# Patient Record
Sex: Male | Born: 1955 | Race: White | Hispanic: No | Marital: Single | Smoking: Never smoker
Health system: Southern US, Community
[De-identification: ages and names within clinical notes are randomized; demographics above are authoritative.]

## PROBLEM LIST (undated history)

## (undated) DIAGNOSIS — K429 Umbilical hernia without obstruction or gangrene: Secondary | ICD-10-CM

## (undated) DIAGNOSIS — R0989 Other specified symptoms and signs involving the circulatory and respiratory systems: Secondary | ICD-10-CM

## (undated) DIAGNOSIS — E7800 Pure hypercholesterolemia, unspecified: Secondary | ICD-10-CM

## (undated) DIAGNOSIS — F1010 Alcohol abuse, uncomplicated: Secondary | ICD-10-CM

## (undated) DIAGNOSIS — I10 Essential (primary) hypertension: Secondary | ICD-10-CM

## (undated) DIAGNOSIS — E669 Obesity, unspecified: Secondary | ICD-10-CM

## (undated) DIAGNOSIS — K4090 Unilateral inguinal hernia, without obstruction or gangrene, not specified as recurrent: Secondary | ICD-10-CM

## (undated) DIAGNOSIS — M1990 Unspecified osteoarthritis, unspecified site: Secondary | ICD-10-CM

## (undated) DIAGNOSIS — E119 Type 2 diabetes mellitus without complications: Secondary | ICD-10-CM

## (undated) HISTORY — DX: Type 2 diabetes mellitus without complications: E11.9

## (undated) HISTORY — DX: Pure hypercholesterolemia, unspecified: E78.00

## (undated) HISTORY — PX: TONSILLECTOMY: SUR1361

---

## 2011-06-26 ENCOUNTER — Ambulatory Visit: Payer: Self-pay | Admitting: Physician Assistant

## 2011-06-26 VITALS — BP 129/78 | HR 73 | Temp 97.7°F | Resp 18 | Ht 66.25 in | Wt 285.8 lb

## 2011-06-26 DIAGNOSIS — L299 Pruritus, unspecified: Secondary | ICD-10-CM

## 2011-06-26 DIAGNOSIS — L255 Unspecified contact dermatitis due to plants, except food: Secondary | ICD-10-CM

## 2011-06-26 MED ORDER — PREDNISONE 20 MG PO TABS: ORAL_TABLET | ORAL | Status: AC

## 2011-06-26 NOTE — Progress Notes (Signed)
Patient ID: Benjamin Rosales. MRN: [id number], DOB: [birth year], 56 y.o. Date of Encounter: 06/26/2011, 4:20 PM  Primary Physician: Tally Due, MD, MD  Chief Complaint: Pruritic rash  HPI: 56 y.o. year old male with presents with a 6 day history of mildly erythematous pruritic rash along bilateral forearms. Patient was doing yard work prior to the development of the rash. Unsure if poison ivy was in the vicinity. Has not yet washed all clothing or linens that have been exposed. Lesions now weeping clear fluid. Has tried Benadryl without much success. Patient is otherwise doing well without issues or complaints.  No past medical history on file.   Home Meds: Prior to Admission medications   Medication Sig Start Date End Date Taking? Authorizing Provider  Multiple Vitamin (MULTIVITAMIN) tablet Take 1 tablet by mouth daily.   Yes Historical Provider, MD  Omega-3 Fatty Acids (FISH OIL PO) Take 1 tablet by mouth daily.   Yes Historical Provider, MD         predniSONE (DELTASONE) 20 MG tablet 3 PO FOR 3 DAYS, 2 PO FOR 3 DAYS, 1 PO FOR 3 DAYS 06/26/11 07/06/11  Sondra Barges, PA-C    Allergies: No Known Allergies  History   Social History  . Marital Status: Single    Spouse Name: N/A    Number of Children: N/A  . Years of Education: N/A   Occupational History  . Not on file.   Social History Main Topics  . Smoking status: Never Smoker   . Smokeless tobacco: Not on file  . Alcohol Use: Not on file  . Drug Use: Not on file  . Sexually Active: Not on file   Other Topics Concern  . Not on file   Social History Narrative  . No narrative on file     Review of Systems: Constitutional: negative for chills, fever, night sweats, weight changes, or fatigue  HEENT: negative for vision changes, hearing loss, congestion, rhinorrhea, ST, epistaxis, or sinus pressure Cardiovascular: negative for chest pain or palpitations Respiratory: negative for hemoptysis, wheezing, shortness of  breath, or cough Abdominal: negative for abdominal pain, nausea, vomiting, diarrhea, or constipation Dermatological: see above Neurologic: negative for headache, dizziness, or syncope   Physical Exam: Blood pressure 129/78, pulse 73, temperature 97.7 F (36.5 C), temperature source Oral, resp. rate 18, height 5' 6.25" (1.683 m), weight 285 lb 12.8 oz (129.638 kg)., Body mass index is 45.78 kg/(m^2). General: Well developed, well nourished, in no acute distress. Head: Normocephalic, atraumatic, eyes without discharge, sclera non-icteric, nares are without discharge. Bilateral auditory canals clear, TM's are without perforation, pearly grey and translucent with reflective cone of light bilaterally. Oral cavity moist, posterior pharynx without exudate, erythema, peritonsillar abscess, or post nasal drip.  Neck: Supple. No thyromegaly. Full ROM. No lymphadenopathy. Lungs: Clear bilaterally to auscultation without wheezes, rales, or rhonchi. Breathing is unlabored. Heart: RRR with S1 S2. No murmurs, rubs, or gallops appreciated. Msk:  Strength and tone normal for age. Extremities/Skin: Warm and dry. Multiple vesicular weeping lesions along bilateral forearms consistent with poison ivy. No secondary infections present. No clubbing or cyanosis. No edema.  Neuro: Alert and oriented X 3. Moves all extremities spontaneously. Gait is normal. CNII-XII grossly in tact. Psych:  Responds to questions appropriately with a normal affect.   Labs: Results for orders placed in visit on 06/26/11  GLUCOSE, POCT (MANUAL RESULT ENTRY)      Component Value Range   POC Glucose 130  ASSESSMENT AND PLAN:  56 y.o. year old male with poison ivy and impaired glucose tolerance  1. Poison ivy -Prednisone 20 mg #18 3x3, 2x3, 1x3 no RF SED -Zyrtec -Zantac -Benadryl -Wash all contaminated clothes and linens -RTC 10 days if symptoms persist, sooner if they worsen  2. Impaired glucose tolerance -Close follow  up -Advised healthy diet -Needs CPE   Signed, Eula Listen, PA-C 06/26/2011 4:20 PM

## 2011-08-04 ENCOUNTER — Other Ambulatory Visit: Payer: Self-pay | Admitting: Physician Assistant

## 2019-04-01 DIAGNOSIS — I639 Cerebral infarction, unspecified: Secondary | ICD-10-CM

## 2019-04-01 HISTORY — DX: Cerebral infarction, unspecified: I63.9

## 2019-04-12 ENCOUNTER — Other Ambulatory Visit: Payer: Self-pay

## 2019-04-12 ENCOUNTER — Inpatient Hospital Stay (HOSPITAL_COMMUNITY)
Admission: EM | Admit: 2019-04-12 | Discharge: 2019-04-14 | DRG: 065 | Disposition: A | Discharge status: Home / Self Care | Payer: BC Managed Care – PPO | Attending: Internal Medicine | Admitting: Internal Medicine

## 2019-04-12 ENCOUNTER — Inpatient Hospital Stay (HOSPITAL_COMMUNITY): Payer: BC Managed Care – PPO

## 2019-04-12 ENCOUNTER — Emergency Department (HOSPITAL_COMMUNITY): Payer: BC Managed Care – PPO

## 2019-04-12 ENCOUNTER — Encounter (HOSPITAL_COMMUNITY): Payer: Self-pay | Admitting: Primary Care

## 2019-04-12 DIAGNOSIS — R29705 NIHSS score 5: Secondary | ICD-10-CM | POA: Diagnosis present

## 2019-04-12 DIAGNOSIS — I639 Cerebral infarction, unspecified: Secondary | ICD-10-CM

## 2019-04-12 DIAGNOSIS — I160 Hypertensive urgency: Secondary | ICD-10-CM | POA: Diagnosis present

## 2019-04-12 DIAGNOSIS — G8191 Hemiplegia, unspecified affecting right dominant side: Secondary | ICD-10-CM | POA: Diagnosis present

## 2019-04-12 DIAGNOSIS — F1010 Alcohol abuse, uncomplicated: Secondary | ICD-10-CM | POA: Diagnosis present

## 2019-04-12 DIAGNOSIS — E785 Hyperlipidemia, unspecified: Secondary | ICD-10-CM | POA: Diagnosis present

## 2019-04-12 DIAGNOSIS — I6381 Other cerebral infarction due to occlusion or stenosis of small artery: Principal | ICD-10-CM

## 2019-04-12 DIAGNOSIS — G4733 Obstructive sleep apnea (adult) (pediatric): Secondary | ICD-10-CM | POA: Diagnosis present

## 2019-04-12 DIAGNOSIS — R471 Dysarthria and anarthria: Secondary | ICD-10-CM | POA: Diagnosis present

## 2019-04-12 DIAGNOSIS — Z20822 Contact with and (suspected) exposure to covid-19: Secondary | ICD-10-CM | POA: Diagnosis present

## 2019-04-12 DIAGNOSIS — E119 Type 2 diabetes mellitus without complications: Secondary | ICD-10-CM | POA: Diagnosis present

## 2019-04-12 DIAGNOSIS — Z6841 Body Mass Index (BMI) 40.0 and over, adult: Secondary | ICD-10-CM

## 2019-04-12 DIAGNOSIS — I672 Cerebral atherosclerosis: Secondary | ICD-10-CM | POA: Diagnosis present

## 2019-04-12 DIAGNOSIS — I351 Nonrheumatic aortic (valve) insufficiency: Secondary | ICD-10-CM | POA: Diagnosis not present

## 2019-04-12 DIAGNOSIS — I10 Essential (primary) hypertension: Secondary | ICD-10-CM | POA: Diagnosis present

## 2019-04-12 HISTORY — DX: Essential (primary) hypertension: I10

## 2019-04-12 LAB — COMPREHENSIVE METABOLIC PANEL
ALT: 59 U/L — ABNORMAL HIGH (ref 0–44)
AST: 33 U/L (ref 15–41)
Albumin: 4 g/dL (ref 3.5–5.0)
Alkaline Phosphatase: 63 U/L (ref 38–126)
Anion gap: 6 (ref 5–15)
BUN: 14 mg/dL (ref 8–23)
CO2: 24 mmol/L (ref 22–32)
Calcium: 8.7 mg/dL — ABNORMAL LOW (ref 8.9–10.3)
Chloride: 104 mmol/L (ref 98–111)
Creatinine, Ser: 0.93 mg/dL (ref 0.61–1.24)
GFR calc Af Amer: >60 mL/min (ref >60)
GFR calc non Af Amer: >60 mL/min (ref >60)
Glucose, Bld: 213 mg/dL — ABNORMAL HIGH (ref 70–99)
Potassium: 4.1 mmol/L (ref 3.5–5.1)
Sodium: 134 mmol/L — ABNORMAL LOW (ref 135–145)
Total Bilirubin: 1 mg/dL (ref 0.3–1.2)
Total Protein: 6.5 g/dL (ref 6.5–8.1)

## 2019-04-12 LAB — PROTIME-INR
INR: 0.9 (ref 0.8–1.2)
Prothrombin Time: 12.5 seconds (ref 11.4–15.2)

## 2019-04-12 LAB — I-STAT CHEM 8, ED
BUN: 13 mg/dL (ref 8–23)
Calcium, Ion: 1.09 mmol/L — ABNORMAL LOW (ref 1.15–1.40)
Chloride: 101 mmol/L (ref 98–111)
Creatinine, Ser: 0.8 mg/dL (ref 0.61–1.24)
Glucose, Bld: 206 mg/dL — ABNORMAL HIGH (ref 70–99)
HCT: 44 % (ref 39.0–52.0)
Hemoglobin: 15 g/dL (ref 13.0–17.0)
Potassium: 4 mmol/L (ref 3.5–5.1)
Sodium: 136 mmol/L (ref 135–145)
TCO2: 25 mmol/L (ref 22–32)

## 2019-04-12 LAB — DIFFERENTIAL
Abs Immature Granulocytes: 0.09 10*3/uL — ABNORMAL HIGH (ref 0.00–0.07)
Basophils Absolute: 0 10*3/uL (ref 0.0–0.1)
Basophils Relative: 1 %
Eosinophils Absolute: 0.1 10*3/uL (ref 0.0–0.5)
Eosinophils Relative: 1 %
Immature Granulocytes: 2 %
Lymphocytes Relative: 20 %
Lymphs Abs: 1.2 10*3/uL (ref 0.7–4.0)
Monocytes Absolute: 0.6 10*3/uL (ref 0.1–1.0)
Monocytes Relative: 10 %
Neutro Abs: 4.1 10*3/uL (ref 1.7–7.7)
Neutrophils Relative %: 66 %

## 2019-04-12 LAB — APTT: aPTT: 24 seconds (ref 24–36)

## 2019-04-12 LAB — URINALYSIS, ROUTINE W REFLEX MICROSCOPIC
Bilirubin Urine: NEGATIVE
Glucose, UA: 50 mg/dL — AB
Hgb urine dipstick: NEGATIVE
Ketones, ur: NEGATIVE mg/dL
Leukocytes,Ua: NEGATIVE
Nitrite: NEGATIVE
Protein, ur: NEGATIVE mg/dL
Specific Gravity, Urine: 1.032 — ABNORMAL HIGH (ref 1.005–1.030)
pH: 7 (ref 5.0–8.0)

## 2019-04-12 LAB — RESPIRATORY PANEL BY RT PCR (FLU A&B, COVID)
Influenza A by PCR: NEGATIVE
Influenza B by PCR: NEGATIVE
SARS Coronavirus 2 by RT PCR: NEGATIVE

## 2019-04-12 LAB — CBC
HCT: 44.6 % (ref 39.0–52.0)
Hemoglobin: 15.3 g/dL (ref 13.0–17.0)
MCH: 31 pg (ref 26.0–34.0)
MCHC: 34.3 g/dL (ref 30.0–36.0)
MCV: 90.5 fL (ref 80.0–100.0)
Platelets: 180 10*3/uL (ref 150–400)
RBC: 4.93 MIL/uL (ref 4.22–5.81)
RDW: 12.4 % (ref 11.5–15.5)
WBC: 6.1 10*3/uL (ref 4.0–10.5)
nRBC: 0 % (ref 0.0–0.2)

## 2019-04-12 LAB — GLUCOSE, CAPILLARY: Glucose-Capillary: 145 mg/dL — ABNORMAL HIGH (ref 70–99)

## 2019-04-12 LAB — CBG MONITORING, ED: Glucose-Capillary: 204 mg/dL — ABNORMAL HIGH (ref 70–99)

## 2019-04-12 LAB — PHOSPHORUS: Phosphorus: 3.6 mg/dL (ref 2.5–4.6)

## 2019-04-12 LAB — MAGNESIUM: Magnesium: 1.8 mg/dL (ref 1.7–2.4)

## 2019-04-12 LAB — HEMOGLOBIN A1C
Hgb A1c MFr Bld: 6.9 % — ABNORMAL HIGH (ref 4.8–5.6)
Mean Plasma Glucose: 151.33 mg/dL

## 2019-04-12 MED ORDER — CLOPIDOGREL BISULFATE 75 MG PO TABS
75.0000 mg | ORAL_TABLET | Freq: Every day | ORAL | Status: DC
  Administered 2019-04-13 – 2019-04-14 (×2): 75 mg via ORAL
  Filled 2019-04-12 (×2): qty 1

## 2019-04-12 MED ORDER — ASPIRIN 325 MG PO TABS
325.0000 mg | ORAL_TABLET | ORAL | Status: AC
  Administered 2019-04-12: 325 mg via ORAL
  Filled 2019-04-12: qty 1

## 2019-04-12 MED ORDER — ENOXAPARIN SODIUM 40 MG/0.4ML ~~LOC~~ SOLN
40.0000 mg | SUBCUTANEOUS | Status: DC
  Administered 2019-04-12 – 2019-04-13 (×2): 40 mg via SUBCUTANEOUS
  Filled 2019-04-12 (×2): qty 0.4

## 2019-04-12 MED ORDER — STROKE: EARLY STAGES OF RECOVERY BOOK
Freq: Once | Status: AC
  Filled 2019-04-12: qty 1

## 2019-04-12 MED ORDER — VITAMIN B-12 100 MCG PO TABS
100.0000 ug | ORAL_TABLET | Freq: Every day | ORAL | Status: DC
  Administered 2019-04-12 – 2019-04-14 (×3): 100 ug via ORAL
  Filled 2019-04-12 (×4): qty 1

## 2019-04-12 MED ORDER — CLOPIDOGREL BISULFATE 300 MG PO TABS
300.0000 mg | ORAL_TABLET | ORAL | Status: AC
  Administered 2019-04-12: 300 mg via ORAL
  Filled 2019-04-12: qty 1

## 2019-04-12 MED ORDER — FOLIC ACID 1 MG PO TABS
1.0000 mg | ORAL_TABLET | Freq: Every day | ORAL | Status: DC
  Administered 2019-04-12 – 2019-04-14 (×3): 1 mg via ORAL
  Filled 2019-04-12 (×3): qty 1

## 2019-04-12 MED ORDER — ACETAMINOPHEN 160 MG/5ML PO SOLN: 650.0000 mg | ORAL | Status: DC | PRN

## 2019-04-12 MED ORDER — IOHEXOL 350 MG/ML SOLN
100.0000 mL | Freq: Once | INTRAVENOUS | Status: AC | PRN
  Administered 2019-04-12: 100 mL via INTRAVENOUS

## 2019-04-12 MED ORDER — ADULT MULTIVITAMIN W/MINERALS CH: 1.0000 | ORAL_TABLET | Freq: Every day | ORAL | Status: DC

## 2019-04-12 MED ORDER — HYDRALAZINE HCL 25 MG PO TABS
25.0000 mg | ORAL_TABLET | Freq: Four times a day (QID) | ORAL | Status: DC | PRN
  Administered 2019-04-13: 25 mg via ORAL
  Filled 2019-04-12: qty 1

## 2019-04-12 MED ORDER — THIAMINE HCL 100 MG/ML IJ SOLN
100.0000 mg | Freq: Every day | INTRAMUSCULAR | Status: DC
  Filled 2019-04-12: qty 2

## 2019-04-12 MED ORDER — ATORVASTATIN CALCIUM 40 MG PO TABS
40.0000 mg | ORAL_TABLET | Freq: Every day | ORAL | Status: DC
  Administered 2019-04-12 – 2019-04-13 (×2): 40 mg via ORAL
  Filled 2019-04-12 (×2): qty 1

## 2019-04-12 MED ORDER — SENNOSIDES-DOCUSATE SODIUM 8.6-50 MG PO TABS: 1.0000 | ORAL_TABLET | Freq: Every evening | ORAL | Status: DC | PRN

## 2019-04-12 MED ORDER — ACETAMINOPHEN 650 MG RE SUPP: 650.0000 mg | RECTAL | Status: DC | PRN

## 2019-04-12 MED ORDER — ASPIRIN 81 MG PO CHEW
81.0000 mg | CHEWABLE_TABLET | Freq: Every day | ORAL | Status: DC
  Administered 2019-04-13 – 2019-04-14 (×2): 81 mg via ORAL
  Filled 2019-04-12 (×2): qty 1

## 2019-04-12 MED ORDER — INSULIN ASPART 100 UNIT/ML ~~LOC~~ SOLN
0.0000 [IU] | Freq: Three times a day (TID) | SUBCUTANEOUS | Status: DC
  Administered 2019-04-12: 1 [IU] via SUBCUTANEOUS
  Administered 2019-04-13 (×2): 2 [IU] via SUBCUTANEOUS
  Administered 2019-04-13: 3 [IU] via SUBCUTANEOUS
  Administered 2019-04-14: 2 [IU] via SUBCUTANEOUS
  Administered 2019-04-14: 1 [IU] via SUBCUTANEOUS

## 2019-04-12 MED ORDER — THIAMINE HCL 100 MG PO TABS
100.0000 mg | ORAL_TABLET | Freq: Every day | ORAL | Status: DC
  Administered 2019-04-12 – 2019-04-14 (×3): 100 mg via ORAL
  Filled 2019-04-12 (×3): qty 1

## 2019-04-12 MED ORDER — LORAZEPAM 1 MG PO TABS: 1.0000 mg | ORAL_TABLET | ORAL | Status: DC | PRN

## 2019-04-12 MED ORDER — ADULT MULTIVITAMIN W/MINERALS CH
1.0000 | ORAL_TABLET | Freq: Every day | ORAL | Status: DC
  Administered 2019-04-12 – 2019-04-14 (×3): 1 via ORAL
  Filled 2019-04-12 (×3): qty 1

## 2019-04-12 MED ORDER — ACETAMINOPHEN 325 MG PO TABS: 650.0000 mg | ORAL_TABLET | ORAL | Status: DC | PRN

## 2019-04-12 MED ORDER — LORAZEPAM 2 MG/ML IJ SOLN: 1.0000 mg | INTRAMUSCULAR | Status: DC | PRN

## 2019-04-12 MED ORDER — SODIUM CHLORIDE 0.9% FLUSH
3.0000 mL | Freq: Once | INTRAVENOUS | Status: AC
Start: 2019-04-12 — End: 2019-04-12
  Administered 2019-04-12: 3 mL via INTRAVENOUS

## 2019-04-12 NOTE — ED Triage Notes (Signed)
Pt states R sided weakness since Tues.  Today he when he woke the was having difficulty speaking and he tried to spit when he was brushing teeth and noticed spit running down his face (around 1 pm).  Pt ao x 4.

## 2019-04-12 NOTE — Consult Note (Addendum)
NEURO HOSPITALIST  CONSULT   Requesting Physician: Dr.     Laurel Dimmer Complaint:  Slurred speech and right side weakness  History obtained from:  Patient     HPI:                                                                                                                                         Benjamin Rosales. is an 64 y.o. male  With no PMH who presented to [facility] ED as a code stroke for slurred speech and right side weakness.    Patient stated that he started feeling weakness on right side. This morning he woke up and wasn't feeling quite right. He noticed that he couldn't spit as forcefully while brushing his teeth. He called his girlfriend and she noticed that his speech was slurred. EMS was called. Denies any smoking, drug abuse, CP, SOB. Endorses drinking ETOH about 2 shots of 100 proof liquor per night. He states that he did stop drinking as of  Monday.   ED course:  CTH: no hemorrhage CTA: no LVO Creatinine: 0.80 BG: 206 BP 160/110  Date last known well: 04/09/19 Time last known well: unknown tPA Given: no outside of window Modified Rankin: Rankin Score=0 NIHSS:5    No past medical history on file.  History reviewed. No pertinent surgical history.  Family History  Problem Relation Age of Onset  . High blood pressure Father   . High blood pressure Brother       Social History:  reports that he has never smoked. He has never used smokeless tobacco. He reports current alcohol use of about 2.0 standard drinks of alcohol per week. He reports that he does not use drugs.  Allergies: No Known Allergies  Medications:                                                                                                                           Current Facility-Administered Medications  Medication Dose Route Frequency Provider Last Rate Last Admin  . [START ON 04/13/2019] aspirin chewable tablet 81 mg  81 mg Oral  Daily Arline Asp, NP      . aspirin tablet 325 mg  325 mg Oral NOW Arline Asp, NP      . clopidogrel (PLAVIX) tablet 300 mg  300 mg Oral NOW Arline Asp, NP      . Melene Muller ON 04/13/2019] clopidogrel (PLAVIX) tablet 75 mg  75 mg Oral Daily Valentina Lucks N, NP      . sodium chloride flush (NS) 0.9 % injection 3 mL  3 mL Intravenous Once Gwyneth Sprout, MD       Current Outpatient Medications  Medication Sig Dispense Refill  . Multiple Vitamin (MULTIVITAMIN) tablet Take 1 tablet by mouth daily.    . Omega-3 Fatty Acids (FISH OIL PO) Take 1 tablet by mouth daily.    Marland Kitchen UNABLE TO FIND Med Name: Vitamin b   1 tablet qd        ROS:                                                                                                                                       ROS was performed and is negative except as noted in HPI    General Examination:                                                                                                      There were no vitals taken for this visit.  Physical Exam  Constitutional: Appears well-developed and well-nourished.  Psych: Affect appropriate to situation Eyes: Normal external eye and conjunctiva. HENT: Normocephalic, no lesions, without obvious abnormality.   Musculoskeletal-no joint tenderness, deformity or swelling Cardiovascular: Normal rate and regular rhythm.  Respiratory: Effort normal, non-labored breathing saturations WNL GI: Soft.  No distension. There is no tenderness.  Skin: WDI  Neurological Examination Mental Status: Alert, oriented, thought content appropriate.  Speech fluent without evidence of aphasia.  Mild dysarthria. Able to follow  commands without difficulty. Cranial Nerves: II: Visual fields grossly normal,  III,IV, VI: ptosis not present, extra-ocular motions intact bilaterally, pupils equal, round, reactive to light and accommodation V,VII: facial droop, facial light touch sensation normal bilaterally VIII:  hearing normal bilaterally IX,X: uvula rises midline XI: bilateral shoulder shrug XII: midline tongue extension Motor: Right : Upper extremity   5/5  Left:     Upper extremity   5/5  Lower extremity   5/5  Lower extremity   4+/5 Tone and bulk:normal tone throughout; no atrophy noted Sensory:light touch intact throughout, bilaterally  Plantars: Right: downgoing   Left: downgoing Cerebellar: Ataxia on RUE Gait: deferred   Lab Results: Basic Metabolic Panel: Recent Labs  Lab 04/12/19 1447  NA 136  K 4.0  CL 101  GLUCOSE 206*  BUN 13  CREATININE 0.80    CBC: Recent Labs  Lab 04/12/19 1441 04/12/19 1447  WBC 6.1  --   NEUTROABS 4.1  --   HGB 15.3 15.0  HCT 44.6 44.0  MCV 90.5  --   PLT 180  --     CBG: Recent Labs  Lab 04/12/19 1440  GLUCAP 204*    Imaging: CT HEAD CODE STROKE WO CONTRAST  Result Date: 04/12/2019 CLINICAL DATA:  Code stroke. 64 year old male with right side weakness and slurred speech. EXAM: CT HEAD WITHOUT CONTRAST TECHNIQUE: Contiguous axial images were obtained from the base of the skull through the vertex without intravenous contrast. COMPARISON:  None. FINDINGS: Brain: No midline shift, mass effect, or evidence of intracranial mass lesion. No acute intracranial hemorrhage identified. No ventriculomegaly. No cortically based infarct changes identified in the left hemisphere. Mild for age scattered white matter hypodensity, somewhat more apparent on the right. Real versus artifactual oval hypodensity in the left pons on series 3, image 12. Vascular: Calcified atherosclerosis at the skull base. Dominant left vertebral artery. No convincing asymmetric vascular hyperdensity. Skull: Negative. Sinuses/Orbits: Maxillary sinus mucous retention cysts, other Visualized paranasal sinuses and mastoids are clear. Other: No acute orbit or scalp soft tissue finding. ASPECTS [facility] Stroke Program Early CT Score) Total score (0-10 with 10 being normal): 10  IMPRESSION: 1. Real versus artifactual lacunar infarct in the left pons. 2. No acute cortically based infarct or acute intracranial hemorrhage identified. ASPECTS 10. 3. These results were communicated to Dr. Laurence Slate at 2:55 pm on 04/12/2019 by text page via the [facility] messaging system. Electronically Signed   By: Odessa Fleming M.D.   On: 04/12/2019 14:56       Valentina Lucks, MSN, NP-C [facility] [phone]  04/12/2019, 2:49 PM   Attending physician note to follow with Assessment and plan .   Assessment: 64 y.o. male with no known PMH who presented to [facility] ED as a code stroke for c/o slurred speech and right side weakness. [facility] was negative for a hemorrhage. He was not a candidate for TPA d/t presenting outside of the window. He was not a candidate for IR d/t CTA negative for LVO. Patient was loaded with ASA and Plavix. Complete stroke work-up needed.  Stroke Risk Factors - none    Recommendations: -- BP goal : Permissive HTN upto 220/110 mmHg  plavix load 300 mg now and ASA 325 mg now ASA  81mg  and plavix daily x 3 weeks --MRI Brain  --Echocardiogram -- High intensity Statin if LDL > 70 -- HgbA1c, fasting lipid panel -- PT consult, OT consult, Speech consult --Telemetry monitoring --Frequent neuro checks --Stroke swallow screen    --please page stroke NP  Or  PA  Or MD from 8am -4 pm  as this patient from this time will be  followed by the stroke.   You can look them up on [email or web address]  Password [email or web address]   NEUROHOSPITALIST ADDENDUM Performed a face to face diagnostic evaluation.   I have reviewed the contents of history and physical exam as documented by PA/ARNP/Resident and agree with above documentation.  I have discussed and formulated the above plan as documented. Edits to the note have been made as needed.  64 year old male who does not  seek medical care, obese-likely has undiagnosed hypertension diabetes (presenting with elevated blood pressure and sugar 239)  presented to the emergency department with worsening slurred speech and right-sided weakness since this morning, symptoms howoever started on Tuesday.  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   Likely 2/2 small vessel disease. 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

## 2019-04-12 NOTE — ED Notes (Signed)
Updated brother Rosanne Ashing.

## 2019-04-12 NOTE — ED Notes (Signed)
Fayrene Fearing [phone]- would like pt updates.

## 2019-04-12 NOTE — ED Notes (Signed)
[phone] wife betsy cartson call for update

## 2019-04-12 NOTE — ED Notes (Signed)
Pt to xray and mri

## 2019-04-12 NOTE — H&P (Signed)
History and Physical    Benjamin Rosales. TTS:[id number] DOB: April 24, 1955 DOA: 04/12/2019  PCP: Benjamin Albee, MD   Patient coming from: Home  I have personally briefly reviewed patient's old medical records in [facility]  Chief Complaint: Right sided weakness  HPI: Benjamin Lara. is a 64 y.o. male with medical history significant of Morbid obesity, presented with new onset of right sided weakness and slurred speech. His symptoms started 3 days ago, initially was right arm and hand weakness, then later he started to feel right leg dragging and clumsy. He quite ignored the symptoms and did not seek medical attention. But last night he noticed that he had hard time to spit and this morning his girl friend found his speech was slurred and encouraged him come in. Denied any headache, no numbness on any limbs and no blurry vision, no chest pain or palpitations. ED Course: CT showed lacunar stroke and BMP showed elevated Glu. BP significantly elevated.  Review of Systems: As per HPI otherwise 10 point review of systems negative.     Past Medical History:  Diagnosis Date  . Hypertension     Past Surgical History:  Procedure Laterality Date  . TONSILLECTOMY       reports that he has never smoked. He has never used smokeless tobacco. He reports current alcohol use of about 28.0 standard drinks of alcohol per week. He reports that he does not use drugs.  No Known Allergies  Family History  Problem Relation Age of Onset  . High blood pressure Father   . High blood pressure Brother      Prior to Admission medications   Medication Sig Start Date End Date Taking? Authorizing Provider  Cyanocobalamin (VITAMIN B12 PO) Take 1 tablet by mouth daily.   Yes [provider]  Multiple Vitamin (MULTIVITAMIN) tablet Take 1 tablet by mouth daily.   Yes [provider]  naproxen sodium (ALEVE) 220 MG tablet Take 220 mg by mouth as needed (pain).   Yes [provider]    Physical Exam: Vitals:   04/12/19 1530 04/12/19 1545 04/12/19 1730 04/12/19 1808  BP: (!) 189/98 (!) 189/98 (!) 175/98 (!) 175/98  Pulse: 60 71 65 65  Resp: 16 20 20    SpO2: 95% 94% 96%   Weight:      Height:        Constitutional: NAD, calm, comfortable Vitals:   04/12/19 1530 04/12/19 1545 04/12/19 1730 04/12/19 1808  BP: (!) 189/98 (!) 189/98 (!) 175/98 (!) 175/98  Pulse: 60 71 65 65  Resp: 16 20 20    SpO2: 95% 94% 96%   Weight:      Height:       Eyes: PERRL, lids and conjunctivae normal ENMT: Mucous membranes are moist. Posterior pharynx clear of any exudate or lesions.Normal dentition.  Neck: normal, supple, no masses, no thyromegaly Respiratory: clear to auscultation bilaterally, no wheezing, no crackles. Normal respiratory effort. No accessory muscle use.  Cardiovascular: Regular rate and rhythm, no murmurs / rubs / gallops. No extremity edema. 2+ pedal pulses. No carotid bruits.  Abdomen: no tenderness, no masses palpated. No hepatosplenomegaly. Bowel sounds positive.  Musculoskeletal: no clubbing / cyanosis. No joint deformity upper and lower extremities. Good ROM, no contractures. Normal muscle tone.  Skin: no rashes, lesions, ulcers. No induration Neurologic: Tongue deviation to left, slight right facial droop, decreased muscle strength on right sided compared to the left, light touch sensation intact. Psychiatric: Normal judgment  and insight. Alert and oriented x 3. Normal mood.     Labs on Admission: I have personally reviewed following labs and imaging studies  CBC: Recent Labs  Lab 04/12/19 1441 04/12/19 1447  WBC 6.1  --   NEUTROABS 4.1  --   HGB 15.3 15.0  HCT 44.6 44.0  MCV 90.5  --   PLT 180  --    Basic Metabolic Panel: Recent Labs  Lab 04/12/19 1441 04/12/19 1447  NA 134* 136  K 4.1 4.0  CL 104 101  CO2 24  --   GLUCOSE 213* 206*  BUN 14 13  CREATININE 0.93 0.80  CALCIUM 8.7*  --    GFR: Estimated Creatinine  Clearance: 125.5 mL/min (by C-G formula based on SCr of 0.8 mg/dL). Liver Function Tests: Recent Labs  Lab 04/12/19 1441  AST 33  ALT 59*  ALKPHOS 63  BILITOT 1.0  PROT 6.5  ALBUMIN 4.0   No results for input(s): LIPASE, AMYLASE in the last 168 hours. No results for input(s): AMMONIA in the last 168 hours. Coagulation Profile: Recent Labs  Lab 04/12/19 1441  INR 0.9   Cardiac Enzymes: No results for input(s): CKTOTAL, CKMB, CKMBINDEX, TROPONINI in the last 168 hours. BNP (last 3 results) No results for input(s): PROBNP in the last 8760 hours. HbA1C: No results for input(s): HGBA1C in the last 72 hours. CBG: Recent Labs  Lab 04/12/19 1440  GLUCAP 204*   Lipid Profile: No results for input(s): CHOL, HDL, LDLCALC, TRIG, CHOLHDL, LDLDIRECT in the last 72 hours. Thyroid Function Tests: No results for input(s): TSH, T4TOTAL, FREET4, T3FREE, THYROIDAB in the last 72 hours. Anemia Panel: No results for input(s): VITAMINB12, FOLATE, FERRITIN, TIBC, IRON, RETICCTPCT in the last 72 hours. Urine analysis:    Component Value Date/Time   COLORURINE YELLOW 04/12/2019 1813   APPEARANCEUR CLEAR 04/12/2019 1813   LABSPEC 1.032 (H) 04/12/2019 1813   PHURINE 7.0 04/12/2019 1813   GLUCOSEU 50 (A) 04/12/2019 1813   HGBUR NEGATIVE 04/12/2019 1813   BILIRUBINUR NEGATIVE 04/12/2019 1813   KETONESUR NEGATIVE 04/12/2019 1813   PROTEINUR NEGATIVE 04/12/2019 1813   NITRITE NEGATIVE 04/12/2019 1813   LEUKOCYTESUR NEGATIVE 04/12/2019 1813    Radiological Exams on Admission: CT Code Stroke CTA Head W/WO contrast  Result Date: 04/12/2019 CLINICAL DATA:  Neuro deficit, acute, stroke suspected. EXAM: CT ANGIOGRAPHY HEAD AND NECK TECHNIQUE: Multidetector CT imaging of the head and neck was performed using the standard protocol during bolus administration of intravenous contrast. Multiplanar CT image reconstructions and MIPs were obtained to evaluate the vascular anatomy. Carotid stenosis  measurements (when applicable) are obtained utilizing NASCET criteria, using the distal internal carotid diameter as the denominator. CONTRAST:  OMNIPAQUE IOHEXOL 350 MG/ML SOLN COMPARISON:  Noncontrast head CT performed earlier the same day 04/12/2019 FINDINGS: CTA NECK FINDINGS Aortic arch: Common origin of the innominate and left common carotid arteries. Mild calcified plaque within the visualized aortic arch and proximal major branch vessels of the neck. No significant innominate or proximal subclavian artery stenosis. Right carotid system: The CCA and ICA are patent within the neck without measurable stenosis. Mild predominantly soft plaque within the proximal right ICA. Left carotid system: CCA and ICA patent within the neck without measurable stenosis. Mild mixed plaque within the distal CCA and carotid bifurcation. Vertebral arteries: The left vertebral artery is significantly dominant. The right vertebral artery is markedly diminutive on a developmental basis but appears patent throughout the neck. The left vertebral artery is patent  within the neck without measurable stenosis. Skeleton: No acute bony abnormality. Other neck: No neck mass identified or cervical lymphadenopathy. Upper chest: No consolidation within the imaged lung apices. Review of the MIP images confirms the above findings CTA HEAD FINDINGS Evaluation is limited due to contrast bolus timing. Anterior circulation: The intracranial internal carotid arteries are patent bilaterally with mild calcified plaque but no significant stenosis. The M1 middle cerebral arteries are patent without significant stenosis. Contrast bolus timing limits evaluation of the M2 and more distal MCA branch vessels bilaterally. No M2 proximal branch occlusion is identified. There are apparent multifocal stenoses within the M2 and more distal right MCA branch vessels. This includes sites of apparent moderate/severe stenosis within a proximal M2 right MCA branch  (series 12, image 17). No left M2 high-grade stenosis is identified. The A1 right anterior cerebral artery is developmentally absent. The A1 left anterior cerebral artery is patent without significant stenosis and supplies the A2 right ACA. Sites of up to moderate/severe stenosis within the proximal to mid A2 left anterior cerebral artery (series 12, image 23). Sites of mild/moderate stenosis within the A2 right anterior cerebral artery and more distal bilateral anterior cerebral arteries. No intracranial aneurysm is identified Posterior circulation: The non dominant right vertebral artery is diminutive but patent and appears to terminate as the right PICA. The intracranial left vertebral artery is patent. Calcified plaque within this vessel with out significant stenosis. The left vertebral artery supplies the basilar artery which is patent without significant stenosis. The posterior cerebral arteries are patent. Sites of mild stenosis within the P2 PCA is bilaterally. Sites of at least moderate stenosis within the distal posterior cerebral arteries bilaterally. Venous sinuses: As permitted by contrast timing, patent. Anatomic variants: Posterior communicating arteries are poorly delineated and may be hypoplastic or absent bilaterally. Review of the MIP images confirms the above findings IMPRESSION: CTA neck: 1. The bilateral common and internal carotid arteries are patent within the neck without significant stenosis. Mild atherosclerosis within the carotid systems as described. 2. The right vertebral artery is developmentally diminutive but appears patent throughout the neck. 3. Dominant left vertebral artery patent within the neck without stenosis. CTA head: 1. Evaluation limited by contrast bolus timing. 2. No intracranial large vessel occlusion. 3. Intracranial atherosclerotic disease with multifocal stenoses most notably as follows. 4. Apparent multifocal stenoses within proximal M2 and more distal right MCA  branch vessels. This includes sites of moderate/severe stenosis within a proximal right M2 branch. 5. Sites of up to moderate/severe stenosis within the proximal to mid A2 left anterior cerebral artery. 6. Sites of at least moderate stenosis within the distal posterior cerebral arteries bilaterally. Electronically Signed   By: Kellie Simmering DO   On: 04/12/2019 16:37   CT Code Stroke CTA Neck W/WO contrast  Result Date: 04/12/2019 CLINICAL DATA:  Neuro deficit, acute, stroke suspected. EXAM: CT ANGIOGRAPHY HEAD AND NECK TECHNIQUE: Multidetector CT imaging of the head and neck was performed using the standard protocol during bolus administration of intravenous contrast. Multiplanar CT image reconstructions and MIPs were obtained to evaluate the vascular anatomy. Carotid stenosis measurements (when applicable) are obtained utilizing NASCET criteria, using the distal internal carotid diameter as the denominator. CONTRAST:  159mL OMNIPAQUE IOHEXOL 350 MG/ML SOLN COMPARISON:  Noncontrast head CT performed earlier the same day 04/12/2019 FINDINGS: CTA NECK FINDINGS Aortic arch: Common origin of the innominate and left common carotid arteries. Mild calcified plaque within the visualized aortic arch and proximal major branch vessels of the  neck. No significant innominate or proximal subclavian artery stenosis. Right carotid system: The CCA and ICA are patent within the neck without measurable stenosis. Mild predominantly soft plaque within the proximal right ICA. Left carotid system: CCA and ICA patent within the neck without measurable stenosis. Mild mixed plaque within the distal CCA and carotid bifurcation. Vertebral arteries: The left vertebral artery is significantly dominant. The right vertebral artery is markedly diminutive on a developmental basis but appears patent throughout the neck. The left vertebral artery is patent within the neck without measurable stenosis. Skeleton: No acute bony abnormality. Other neck:  No neck mass identified or cervical lymphadenopathy. Upper chest: No consolidation within the imaged lung apices. Review of the MIP images confirms the above findings CTA HEAD FINDINGS Evaluation is limited due to contrast bolus timing. Anterior circulation: The intracranial internal carotid arteries are patent bilaterally with mild calcified plaque but no significant stenosis. The M1 middle cerebral arteries are patent without significant stenosis. Contrast bolus timing limits evaluation of the M2 and more distal MCA branch vessels bilaterally. No M2 proximal branch occlusion is identified. There are apparent multifocal stenoses within the M2 and more distal right MCA branch vessels. This includes sites of apparent moderate/severe stenosis within a proximal M2 right MCA branch (series 12, image 17). No left M2 high-grade stenosis is identified. The A1 right anterior cerebral artery is developmentally absent. The A1 left anterior cerebral artery is patent without significant stenosis and supplies the A2 right ACA. Sites of up to moderate/severe stenosis within the proximal to mid A2 left anterior cerebral artery (series 12, image 23). Sites of mild/moderate stenosis within the A2 right anterior cerebral artery and more distal bilateral anterior cerebral arteries. No intracranial aneurysm is identified Posterior circulation: The non dominant right vertebral artery is diminutive but patent and appears to terminate as the right PICA. The intracranial left vertebral artery is patent. Calcified plaque within this vessel with out significant stenosis. The left vertebral artery supplies the basilar artery which is patent without significant stenosis. The posterior cerebral arteries are patent. Sites of mild stenosis within the P2 PCA is bilaterally. Sites of at least moderate stenosis within the distal posterior cerebral arteries bilaterally. Venous sinuses: As permitted by contrast timing, patent. Anatomic variants:  Posterior communicating arteries are poorly delineated and may be hypoplastic or absent bilaterally. Review of the MIP images confirms the above findings IMPRESSION: CTA neck: 1. The bilateral common and internal carotid arteries are patent within the neck without significant stenosis. Mild atherosclerosis within the carotid systems as described. 2. The right vertebral artery is developmentally diminutive but appears patent throughout the neck. 3. Dominant left vertebral artery patent within the neck without stenosis. CTA head: 1. Evaluation limited by contrast bolus timing. 2. No intracranial large vessel occlusion. 3. Intracranial atherosclerotic disease with multifocal stenoses most notably as follows. 4. Apparent multifocal stenoses within proximal M2 and more distal right MCA branch vessels. This includes sites of moderate/severe stenosis within a proximal right M2 branch. 5. Sites of up to moderate/severe stenosis within the proximal to mid A2 left anterior cerebral artery. 6. Sites of at least moderate stenosis within the distal posterior cerebral arteries bilaterally. Electronically Signed   By: Jackey Loge DO   On: 04/12/2019 16:37   CT HEAD CODE STROKE WO CONTRAST  Result Date: 04/12/2019 CLINICAL DATA:  Code stroke. 64 year old male with right side weakness and slurred speech. EXAM: CT HEAD WITHOUT CONTRAST TECHNIQUE: Contiguous axial images were obtained from the base of the  skull through the vertex without intravenous contrast. COMPARISON:  None. FINDINGS: Brain: No midline shift, mass effect, or evidence of intracranial mass lesion. No acute intracranial hemorrhage identified. No ventriculomegaly. No cortically based infarct changes identified in the left hemisphere. Mild for age scattered white matter hypodensity, somewhat more apparent on the right. Real versus artifactual oval hypodensity in the left pons on series 3, image 12. Vascular: Calcified atherosclerosis at the skull base. Dominant  left vertebral artery. No convincing asymmetric vascular hyperdensity. Skull: Negative. Sinuses/Orbits: Maxillary sinus mucous retention cysts, other Visualized paranasal sinuses and mastoids are clear. Other: No acute orbit or scalp soft tissue finding. ASPECTS [facility] Stroke Program Early CT Score) Total score (0-10 with 10 being normal): 10 IMPRESSION: 1. Real versus artifactual lacunar infarct in the left pons. 2. No acute cortically based infarct or acute intracranial hemorrhage identified. ASPECTS 10. 3. These results were communicated to Dr. Laurence Slate at 2:55 pm on 04/12/2019 by text page via the [facility] messaging system. Electronically Signed   By: Odessa Fleming M.D.   On: 04/12/2019 14:56    EKG: Independently reviewed.   Assessment/Plan Active Problems:   Lacunar stroke (HCC)   Stroke (cerebrum) (HCC)  Lacunar stroke From uncontrolled HTN Permissive HTN for 48 hours (looks like his stroke symptoms still evolving) ASA and Statin. Echo Carotid Doppler MRI w/o contrast. CTA and CT reviewed.  New onset DM Check A1C Sliding scale for now Likely can go home with PO DM meds.  Uncontrolled HTN As above, PRN Hydralazine for now.  EtOH abuse No s/s of withdrawal as of now, CIWA protocol with PRN Benzo.  Morbid Obesity Outpt Bariatric evaluation  DVT prophylaxis: Lovenox Code Status: Full Code Family Communication: None at bedside Disposition Plan: PT evaluation, probably no need for rehab. Consults called: Neurology Admission status: Tele   Emeline General MD [facility] Pager [phone]    04/12/2019, 7:25 PM

## 2019-04-12 NOTE — Code Documentation (Signed)
64 yo male coming from home. Pt reports on Tuesday noting some right sided weakness in his arm and leg. This morning he woke up and felt like it was better, but not back to baseline. Around 1300, pt started brushing his teeth and noted that when he spit, it went down the front of his shirt and his speech sounded funny. Called EMS and EMS activated a Code Stroke. Stroke Team met patient upon arrival to the ED. Labs drawn and pt taken to CT. CT/CTA completed. Pt is not a candidate for tPA due to being outside the window. Pt brought back to the room and placed on the cardiac monitor. Aroor at the bedside assessing. q2 hour mNIHSS/VS and patient to be admitted for stroke work-up. Handoff given to [employer], Therapist, sports.

## 2019-04-12 NOTE — ED Provider Notes (Signed)
[facility]   CSN: [id number] Arrival date & time: 04/12/19  1439     History Chief Complaint  Patient presents with  . Code Stroke    Benjamin BendJohn W Copeman Jr. is a 64 y.o. male.  The history is provided by the patient and the EMS personnel.  Cerebrovascular Accident This is a new problem. The problem occurs constantly. The problem has not changed since onset.Associated symptoms include headaches (mild this AM, since resolved). Pertinent negatives include no chest pain, no abdominal pain and no shortness of breath. Nothing aggravates the symptoms. Nothing relieves the symptoms. He has tried nothing for the symptoms.  Here for slurred speech and right sided weakness.  Pt reports he had been feeling some weakness in his right hand for the past 3 days.  Noticed that he was having weakness in right leg this AM and was drooling when trying to brush his teeth.  Called his GF and was told his speech was slurred so EMS was called to bring in the pt.       Past Medical History:  Diagnosis Date  . Hypertension     Patient Active Problem List   Diagnosis Date Noted  . Lacunar stroke (HCC) 04/12/2019  . Acute ischemic stroke (HCC) 04/12/2019    Past Surgical History:  Procedure Laterality Date  . TONSILLECTOMY         Family History  Problem Relation Age of Onset  . High blood pressure Father   . High blood pressure Brother     Social History   Tobacco Use  . Smoking status: Never Smoker  . Smokeless tobacco: Never Used  Substance Use Topics  . Alcohol use: Yes    Alcohol/week: 28.0 standard drinks    Types: 28 Shots of liquor per week    Comment: 1 pint of liquor per day  . Drug use: Never    Home Medications Prior to Admission medications   Medication Sig Start Date End Date Taking? Authorizing Provider  Cyanocobalamin (VITAMIN B12 PO) Take 1 tablet by mouth daily.   Yes [provider]  Multiple Vitamin  (MULTIVITAMIN) tablet Take 1 tablet by mouth daily.   Yes [provider]  naproxen sodium (ALEVE) 220 MG tablet Take 220 mg by mouth as needed (pain).   Yes [provider]    Allergies    Patient has no known allergies.  Review of Systems   Review of Systems  Constitutional: Negative for fever.  Respiratory: Negative for shortness of breath.   Cardiovascular: Negative for chest pain.  Gastrointestinal: Negative for abdominal pain.  Neurological: Positive for headaches (mild this AM, since resolved).  All other systems reviewed and are negative.   Physical Exam Updated Vital Signs BP (!) 194/98 (BP Location: Left Arm)   Pulse 64   Temp 98.8 F (37.1 C) (Oral)   Resp 19   Ht 5\' 7"  (1.702 m)   Wt 129.6 kg   SpO2 97%   BMI 44.75 kg/m   Physical Exam Vitals and nursing note reviewed.  Constitutional:      Appearance: He is well-developed.  HENT:     Head: Normocephalic and atraumatic.  Eyes:     Conjunctiva/sclera: Conjunctivae normal.  Cardiovascular:     Rate and Rhythm: Normal rate and regular rhythm.     Heart sounds: No murmur.  Pulmonary:     Effort: Pulmonary effort is normal. No respiratory distress.     Breath sounds:  Normal breath sounds.  Abdominal:     Palpations: Abdomen is soft.     Tenderness: There is no abdominal tenderness.  Musculoskeletal:     Cervical back: Neck supple.  Skin:    General: Skin is warm and dry.  Neurological:     Mental Status: He is alert and oriented to person, place, and time.     GCS: GCS eye subscore is 4. GCS verbal subscore is 5. GCS motor subscore is 6.     Cranial Nerves: Dysarthria and facial asymmetry (right facial droop, mild) present.     Sensory: Sensation is intact.     Motor: No pronator drift.     Deep Tendon Reflexes: Reflexes are normal and symmetric.     ED Results / Procedures / Treatments   Labs (all labs ordered are listed, but only abnormal results are displayed) Labs Reviewed    DIFFERENTIAL - Abnormal; Notable for the following components:      Result Value   Abs Immature Granulocytes 0.09 (*)    All other components within normal limits  COMPREHENSIVE METABOLIC PANEL - Abnormal; Notable for the following components:   Sodium 134 (*)    Glucose, Bld 213 (*)    Calcium 8.7 (*)    ALT 59 (*)    All other components within normal limits  URINALYSIS, ROUTINE W REFLEX MICROSCOPIC - Abnormal; Notable for the following components:   Specific Gravity, Urine 1.032 (*)    Glucose, UA 50 (*)    All other components within normal limits  HEMOGLOBIN A1C - Abnormal; Notable for the following components:   Hgb A1c MFr Bld 6.9 (*)    All other components within normal limits  GLUCOSE, CAPILLARY - Abnormal; Notable for the following components:   Glucose-Capillary 145 (*)    All other components within normal limits  I-STAT CHEM 8, ED - Abnormal; Notable for the following components:   Glucose, Bld 206 (*)    Calcium, Ion 1.09 (*)    All other components within normal limits  CBG MONITORING, ED - Abnormal; Notable for the following components:   Glucose-Capillary 204 (*)    All other components within normal limits  RESPIRATORY PANEL BY RT PCR (FLU A&B, COVID)  PROTIME-INR  APTT  CBC  MAGNESIUM  PHOSPHORUS  BASIC METABOLIC PANEL  HEMOGLOBIN A1C  LIPID PANEL  HIV ANTIBODY (ROUTINE TESTING W REFLEX)    EKG EKG Interpretation  Date/Time:  Friday April 12 2019 15:12:01 EST Ventricular Rate:  74 PR Interval:    QRS Duration: 93 QT Interval:  394 QTC Calculation: 438 R Axis:   42 Text Interpretation: Sinus rhythm Minimal ST elevation, anterior leads No significant change since last tracing Confirmed by Gwyneth Sprout ([id number]) on 04/12/2019 3:47:12 PM   Radiology CT Code Stroke CTA Head W/WO contrast  Result Date: 04/12/2019 CLINICAL DATA:  Neuro deficit, acute, stroke suspected. EXAM: CT ANGIOGRAPHY HEAD AND NECK TECHNIQUE: Multidetector CT imaging of  the head and neck was performed using the standard protocol during bolus administration of intravenous contrast. Multiplanar CT image reconstructions and MIPs were obtained to evaluate the vascular anatomy. Carotid stenosis measurements (when applicable) are obtained utilizing NASCET criteria, using the distal internal carotid diameter as the denominator. CONTRAST:  OMNIPAQUE IOHEXOL 350 MG/ML SOLN COMPARISON:  Noncontrast head CT performed earlier the same day 04/12/2019 FINDINGS: CTA NECK FINDINGS Aortic arch: Common origin of the innominate and left common carotid arteries. Mild calcified plaque within the visualized aortic arch and  proximal major branch vessels of the neck. No significant innominate or proximal subclavian artery stenosis. Right carotid system: The CCA and ICA are patent within the neck without measurable stenosis. Mild predominantly soft plaque within the proximal right ICA. Left carotid system: CCA and ICA patent within the neck without measurable stenosis. Mild mixed plaque within the distal CCA and carotid bifurcation. Vertebral arteries: The left vertebral artery is significantly dominant. The right vertebral artery is markedly diminutive on a developmental basis but appears patent throughout the neck. The left vertebral artery is patent within the neck without measurable stenosis. Skeleton: No acute bony abnormality. Other neck: No neck mass identified or cervical lymphadenopathy. Upper chest: No consolidation within the imaged lung apices. Review of the MIP images confirms the above findings CTA HEAD FINDINGS Evaluation is limited due to contrast bolus timing. Anterior circulation: The intracranial internal carotid arteries are patent bilaterally with mild calcified plaque but no significant stenosis. The M1 middle cerebral arteries are patent without significant stenosis. Contrast bolus timing limits evaluation of the M2 and more distal MCA branch vessels bilaterally. No M2 proximal  branch occlusion is identified. There are apparent multifocal stenoses within the M2 and more distal right MCA branch vessels. This includes sites of apparent moderate/severe stenosis within a proximal M2 right MCA branch (series 12, image 17). No left M2 high-grade stenosis is identified. The A1 right anterior cerebral artery is developmentally absent. The A1 left anterior cerebral artery is patent without significant stenosis and supplies the A2 right ACA. Sites of up to moderate/severe stenosis within the proximal to mid A2 left anterior cerebral artery (series 12, image 23). Sites of mild/moderate stenosis within the A2 right anterior cerebral artery and more distal bilateral anterior cerebral arteries. No intracranial aneurysm is identified Posterior circulation: The non dominant right vertebral artery is diminutive but patent and appears to terminate as the right PICA. The intracranial left vertebral artery is patent. Calcified plaque within this vessel with out significant stenosis. The left vertebral artery supplies the basilar artery which is patent without significant stenosis. The posterior cerebral arteries are patent. Sites of mild stenosis within the P2 PCA is bilaterally. Sites of at least moderate stenosis within the distal posterior cerebral arteries bilaterally. Venous sinuses: As permitted by contrast timing, patent. Anatomic variants: Posterior communicating arteries are poorly delineated and may be hypoplastic or absent bilaterally. Review of the MIP images confirms the above findings IMPRESSION: CTA neck: 1. The bilateral common and internal carotid arteries are patent within the neck without significant stenosis. Mild atherosclerosis within the carotid systems as described. 2. The right vertebral artery is developmentally diminutive but appears patent throughout the neck. 3. Dominant left vertebral artery patent within the neck without stenosis. CTA head: 1. Evaluation limited by contrast  bolus timing. 2. No intracranial large vessel occlusion. 3. Intracranial atherosclerotic disease with multifocal stenoses most notably as follows. 4. Apparent multifocal stenoses within proximal M2 and more distal right MCA branch vessels. This includes sites of moderate/severe stenosis within a proximal right M2 branch. 5. Sites of up to moderate/severe stenosis within the proximal to mid A2 left anterior cerebral artery. 6. Sites of at least moderate stenosis within the distal posterior cerebral arteries bilaterally. Electronically Signed   By: Jackey Loge DO   On: 04/12/2019 16:37   DG Chest 2 View  Result Date: 04/12/2019 CLINICAL DATA:  Hypertension, neuro deficit, stroke suspected EXAM: CHEST - 2 VIEW COMPARISON:  None. FINDINGS: No consolidation, features of edema, pneumothorax, or effusion. Pulmonary  vascularity is normally distributed. The cardiomediastinal contours are unremarkable. No acute osseous or soft tissue abnormality. Degenerative changes are present in the imaged spine and shoulders. Telemetry leads overlie the chest. IMPRESSION: No acute cardiopulmonary findings. Electronically Signed   By: Kreg Shropshire M.D.   On: 04/12/2019 19:37   CT Code Stroke CTA Neck W/WO contrast  Result Date: 04/12/2019 CLINICAL DATA:  Neuro deficit, acute, stroke suspected. EXAM: CT ANGIOGRAPHY HEAD AND NECK TECHNIQUE: Multidetector CT imaging of the head and neck was performed using the standard protocol during bolus administration of intravenous contrast. Multiplanar CT image reconstructions and MIPs were obtained to evaluate the vascular anatomy. Carotid stenosis measurements (when applicable) are obtained utilizing NASCET criteria, using the distal internal carotid diameter as the denominator. CONTRAST:  OMNIPAQUE IOHEXOL 350 MG/ML SOLN COMPARISON:  Noncontrast head CT performed earlier the same day 04/12/2019 FINDINGS: CTA NECK FINDINGS Aortic arch: Common origin of the innominate and left common  carotid arteries. Mild calcified plaque within the visualized aortic arch and proximal major branch vessels of the neck. No significant innominate or proximal subclavian artery stenosis. Right carotid system: The CCA and ICA are patent within the neck without measurable stenosis. Mild predominantly soft plaque within the proximal right ICA. Left carotid system: CCA and ICA patent within the neck without measurable stenosis. Mild mixed plaque within the distal CCA and carotid bifurcation. Vertebral arteries: The left vertebral artery is significantly dominant. The right vertebral artery is markedly diminutive on a developmental basis but appears patent throughout the neck. The left vertebral artery is patent within the neck without measurable stenosis. Skeleton: No acute bony abnormality. Other neck: No neck mass identified or cervical lymphadenopathy. Upper chest: No consolidation within the imaged lung apices. Review of the MIP images confirms the above findings CTA HEAD FINDINGS Evaluation is limited due to contrast bolus timing. Anterior circulation: The intracranial internal carotid arteries are patent bilaterally with mild calcified plaque but no significant stenosis. The M1 middle cerebral arteries are patent without significant stenosis. Contrast bolus timing limits evaluation of the M2 and more distal MCA branch vessels bilaterally. No M2 proximal branch occlusion is identified. There are apparent multifocal stenoses within the M2 and more distal right MCA branch vessels. This includes sites of apparent moderate/severe stenosis within a proximal M2 right MCA branch (series 12, image 17). No left M2 high-grade stenosis is identified. The A1 right anterior cerebral artery is developmentally absent. The A1 left anterior cerebral artery is patent without significant stenosis and supplies the A2 right ACA. Sites of up to moderate/severe stenosis within the proximal to mid A2 left anterior cerebral artery (series  12, image 23). Sites of mild/moderate stenosis within the A2 right anterior cerebral artery and more distal bilateral anterior cerebral arteries. No intracranial aneurysm is identified Posterior circulation: The non dominant right vertebral artery is diminutive but patent and appears to terminate as the right PICA. The intracranial left vertebral artery is patent. Calcified plaque within this vessel with out significant stenosis. The left vertebral artery supplies the basilar artery which is patent without significant stenosis. The posterior cerebral arteries are patent. Sites of mild stenosis within the P2 PCA is bilaterally. Sites of at least moderate stenosis within the distal posterior cerebral arteries bilaterally. Venous sinuses: As permitted by contrast timing, patent. Anatomic variants: Posterior communicating arteries are poorly delineated and may be hypoplastic or absent bilaterally. Review of the MIP images confirms the above findings IMPRESSION: CTA neck: 1. The bilateral common and internal carotid arteries  are patent within the neck without significant stenosis. Mild atherosclerosis within the carotid systems as described. 2. The right vertebral artery is developmentally diminutive but appears patent throughout the neck. 3. Dominant left vertebral artery patent within the neck without stenosis. CTA head: 1. Evaluation limited by contrast bolus timing. 2. No intracranial large vessel occlusion. 3. Intracranial atherosclerotic disease with multifocal stenoses most notably as follows. 4. Apparent multifocal stenoses within proximal M2 and more distal right MCA branch vessels. This includes sites of moderate/severe stenosis within a proximal right M2 branch. 5. Sites of up to moderate/severe stenosis within the proximal to mid A2 left anterior cerebral artery. 6. Sites of at least moderate stenosis within the distal posterior cerebral arteries bilaterally. Electronically Signed   By: Kellie Simmering DO   On:  04/12/2019 16:37   MR BRAIN WO CONTRAST  Result Date: 04/12/2019 CLINICAL DATA:  Follow-up examination for acute stroke. EXAM: MRI HEAD WITHOUT CONTRAST TECHNIQUE: Multiplanar, multiecho pulse sequences of the brain and surrounding structures were obtained without intravenous contrast. COMPARISON:  Prior CT and CTA from earlier the same day. FINDINGS: Brain: Generalized age-related cerebral atrophy. Patchy T2/FLAIR hyperintensity within the periventricular deep white matter both cerebral hemispheres most consistent with chronic small vessel ischemic disease, moderate in nature. Area of restricted diffusion measuring 2.2 x 1.1 x 0.7 mm seen involving the left paramedian pons (series 5, image 71), consistent with acute ischemic infarct. No associated hemorrhage or mass effect. No other evidence for acute or subacute ischemia. Gray-white matter differentiation otherwise maintained. No encephalomalacia to suggest chronic cortical infarction elsewhere within the brain. No foci of susceptibility artifact to suggest acute or chronic intracranial hemorrhage. No mass lesion, midline shift or mass effect. No hydrocephalus. No extra-axial fluid collection. Pituitary gland suprasellar region normal. Midline structures intact. Vascular: Major intracranial vascular flow voids are well maintained. Hypoplastic right vertebral artery noted. Skull and upper cervical spine: Craniocervical junction within normal limits. Upper cervical spine normal. Bone marrow signal intensity within normal limits. No scalp soft tissue abnormality. Sinuses/Orbits: Globes and orbital soft tissues demonstrate no acute finding. Small retention cyst noted within the maxillary sinuses bilaterally. Paranasal sinuses are otherwise clear. No mastoid effusion. Inner ear structures grossly normal. Other: None. IMPRESSION: 1. 2.2 x 1.1 x 0.7 mm acute ischemic nonhemorrhagic left paramedian pontine infarct. 2. Underlying mild cerebral atrophy with moderate  chronic small vessel ischemic disease. Electronically Signed   By: Jeannine Boga M.D.   On: 04/12/2019 20:38   CT HEAD CODE STROKE WO CONTRAST  Result Date: 04/12/2019 CLINICAL DATA:  Code stroke. 64 year old male with right side weakness and slurred speech. EXAM: CT HEAD WITHOUT CONTRAST TECHNIQUE: Contiguous axial images were obtained from the base of the skull through the vertex without intravenous contrast. COMPARISON:  None. FINDINGS: Brain: No midline shift, mass effect, or evidence of intracranial mass lesion. No acute intracranial hemorrhage identified. No ventriculomegaly. No cortically based infarct changes identified in the left hemisphere. Mild for age scattered white matter hypodensity, somewhat more apparent on the right. Real versus artifactual oval hypodensity in the left pons on series 3, image 12. Vascular: Calcified atherosclerosis at the skull base. Dominant left vertebral artery. No convincing asymmetric vascular hyperdensity. Skull: Negative. Sinuses/Orbits: Maxillary sinus mucous retention cysts, other Visualized paranasal sinuses and mastoids are clear. Other: No acute orbit or scalp soft tissue finding. ASPECTS [facility] Stroke Program Early CT Score) Total score (0-10 with 10 being normal): 10 IMPRESSION: 1. Real versus artifactual lacunar infarct in the left pons.  2. No acute cortically based infarct or acute intracranial hemorrhage identified. ASPECTS 10. 3. These results were communicated to Dr. Laurence Slate at 2:55 pm on 04/12/2019 by text page via the [facility] messaging system. Electronically Signed   By: Odessa Fleming M.D.   On: 04/12/2019 14:56    Procedures Procedures (including critical care time)  Medications Ordered in ED Medications  aspirin chewable tablet 81 mg (has no administration in time range)  clopidogrel (PLAVIX) tablet 75 mg (has no administration in time range)  vitamin B-12 (CYANOCOBALAMIN) tablet 100 mcg (100 mcg Oral Given 04/12/19 2147)  multivitamin with  minerals tablet 1 tablet (1 tablet Oral Given 04/12/19 2147)  acetaminophen (TYLENOL) tablet 650 mg (has no administration in time range)    Or  acetaminophen (TYLENOL) 160 MG/5ML solution 650 mg (has no administration in time range)    Or  acetaminophen (TYLENOL) suppository 650 mg (has no administration in time range)  senna-docusate (Senokot-S) tablet 1 tablet (has no administration in time range)  enoxaparin (LOVENOX) injection 40 mg (40 mg Subcutaneous Given 04/12/19 2149)  insulin aspart (novoLOG) injection 0-9 Units (1 Units Subcutaneous Given 04/12/19 2154)  hydrALAZINE (APRESOLINE) tablet 25 mg (has no administration in time range)  atorvastatin (LIPITOR) tablet 40 mg (40 mg Oral Given 04/12/19 2147)  LORazepam (ATIVAN) tablet 1-4 mg (has no administration in time range)    Or  LORazepam (ATIVAN) injection 1-4 mg (has no administration in time range)  thiamine tablet 100 mg (100 mg Oral Given 04/12/19 2147)    Or  thiamine (B-1) injection 100 mg ( Intravenous See Alternative 04/12/19 2147)  folic acid (FOLVITE) tablet 1 mg (1 mg Oral Given 04/12/19 2147)  sodium chloride flush (NS) 0.9 % injection 3 mL (3 mLs Intravenous Given 04/12/19 2155)  aspirin tablet 325 mg (325 mg Oral Given 04/12/19 1741)  clopidogrel (PLAVIX) tablet 300 mg (300 mg Oral Given 04/12/19 1741)  iohexol (OMNIPAQUE) 350 MG/ML injection 100 mL (100 mLs Intravenous Contrast Given 04/12/19 1609)   stroke: mapping our early stages of recovery book ( Does not apply Given 04/12/19 2149)    ED Course  I have reviewed the triage vital signs and the nursing notes.  Pertinent labs & imaging results that were available during my care of the patient were reviewed by me and considered in my medical decision making (see chart for details).    MDM Rules/Calculators/A&P                      64 year old male with no known past medical history who presents to the ED for slurred speech and right-sided weakness.  Patient code stroke  on arrival. Signs and symptoms concerning for CVA. Evaluated by neurology Dr. Laurence Slate who recommends admission for stroke work-up, patient given aspirin and Plavix.  Pt to be admitted to hospital medicine Dr Chipper Herb for further workup and evaluation.    Final Clinical Impression(s) / ED Diagnoses Final diagnoses:  Acute ischemic stroke [facility])    Rx / DC Orders ED Discharge Orders    None       Adrean Heitz, [place], MD 04/13/19 [id number]    Gwyneth Sprout, MD 04/13/19 [phone]

## 2019-04-13 ENCOUNTER — Encounter (HOSPITAL_COMMUNITY): Payer: BC Managed Care – PPO

## 2019-04-13 ENCOUNTER — Inpatient Hospital Stay (HOSPITAL_COMMUNITY): Payer: BC Managed Care – PPO

## 2019-04-13 DIAGNOSIS — I6381 Other cerebral infarction due to occlusion or stenosis of small artery: Principal | ICD-10-CM

## 2019-04-13 DIAGNOSIS — I160 Hypertensive urgency: Secondary | ICD-10-CM

## 2019-04-13 DIAGNOSIS — I351 Nonrheumatic aortic (valve) insufficiency: Secondary | ICD-10-CM

## 2019-04-13 DIAGNOSIS — I639 Cerebral infarction, unspecified: Secondary | ICD-10-CM

## 2019-04-13 LAB — ECHOCARDIOGRAM COMPLETE
Height: 67 in
Weight: 4571.46 oz

## 2019-04-13 LAB — GLUCOSE, CAPILLARY
Glucose-Capillary: 152 mg/dL — ABNORMAL HIGH (ref 70–99)
Glucose-Capillary: 177 mg/dL — ABNORMAL HIGH (ref 70–99)
Glucose-Capillary: 216 mg/dL — ABNORMAL HIGH (ref 70–99)
Glucose-Capillary: 193 mg/dL — ABNORMAL HIGH (ref 70–99)

## 2019-04-13 LAB — BASIC METABOLIC PANEL
Anion gap: 10 (ref 5–15)
BUN: 11 mg/dL (ref 8–23)
CO2: 26 mmol/L (ref 22–32)
Calcium: 9.4 mg/dL (ref 8.9–10.3)
Chloride: 101 mmol/L (ref 98–111)
Creatinine, Ser: 0.91 mg/dL (ref 0.61–1.24)
GFR calc Af Amer: >60 mL/min (ref >60)
GFR calc non Af Amer: >60 mL/min (ref >60)
Glucose, Bld: 178 mg/dL — ABNORMAL HIGH (ref 70–99)
Potassium: 3.7 mmol/L (ref 3.5–5.1)
Sodium: 137 mmol/L (ref 135–145)

## 2019-04-13 LAB — LIPID PANEL
Cholesterol: 230 mg/dL — ABNORMAL HIGH (ref 0–200)
HDL: 49 mg/dL (ref >40)
LDL Cholesterol: 145 mg/dL — ABNORMAL HIGH (ref 0–99)
Total CHOL/HDL Ratio: 4.7 RATIO
Triglycerides: 181 mg/dL — ABNORMAL HIGH (ref <150)
VLDL: 36 mg/dL (ref 0–40)

## 2019-04-13 LAB — HIV ANTIBODY (ROUTINE TESTING W REFLEX): HIV Screen 4th Generation wRfx: NONREACTIVE

## 2019-04-13 LAB — HEMOGLOBIN A1C
Hgb A1c MFr Bld: 6.8 % — ABNORMAL HIGH (ref 4.8–5.6)
Mean Plasma Glucose: 148.46 mg/dL

## 2019-04-13 NOTE — Progress Notes (Addendum)
STROKE TEAM PROGRESS NOTE   HISTORY OF PRESENT ILLNESS (per record) Benjamin Rosales. is an 64 y.o. male  With no PMH who presented to [facility] ED as a code stroke for slurred speech and right side weakness.  Patient stated that he started feeling weakness on right side. This morning he woke up and wasn't feeling quite right. He noticed that he couldn't spit as forcefully while brushing his teeth. He called his girlfriend and she noticed that his speech was slurred. EMS was called. Denies any smoking, drug abuse, CP, SOB. Endorses drinking ETOH about 2 shots of 100 proof liquor per night. He states that he did stop drinking as of  Monday.  ED course:  CTH: no hemorrhage CTA: no LVO Creatinine: 0.80 BG: 206 BP 160/110 Date last known well: 04/09/19 Time last known well: unknown tPA Given: no outside of window Modified Rankin: Rankin Score=0 NIHSS:5   INTERVAL HISTORY The patient's wife is at the bedside.  We had extensive discussion about his stroke.  Imaging is reviewed in person and also with the patient.  The patient apparently has not seen a physician in over 25 years and therefore has not had his blood pressure and overall health checked.  He is noted to be obese and with a large neck and the wife does corroborate the snores quite a bit and has witnessed apnea.  He apparently has nocturnal jerks additionally.  He reports that the right upper extremity is still weak and he still has difficulties with slurring of his speech.  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  There is marked periventricular leukoencephalopathy and deep white matter changes consistent with chronic microvascular disease.  This is likely due to chronic uncontrolled/untreated hypertension.  OBJECTIVE Vitals:   04/13/19 0321 04/13/19 0801 04/13/19 1141 04/13/19 1601  BP: (!) 184/109 (!) 194/99 107/60 (!) 150/96  Pulse: 64 70 81 69  Resp: 18 19 18 19   Temp: 98.6 F (37  C) 98.7 F (37.1 C) 97.6 F (36.4 C) 97.8 F (36.6 C)  TempSrc: Oral Oral Oral Oral  SpO2: 96% 97% 98% 97%  Weight:      Height:        CBC:  Recent Labs  Lab 04/12/19 1441 04/12/19 1447  WBC 6.1  --   NEUTROABS 4.1  --   HGB 15.3 15.0  HCT 44.6 44.0  MCV 90.5  --   PLT 180  --     Basic Metabolic Panel:  Recent Labs  Lab 04/12/19 1441 04/12/19 1441 04/12/19 1447 04/12/19 1928 04/13/19 0257  NA 134*   < > 136  --  137  K 4.1   < > 4.0  --  3.7  CL 104   < > 101  --  101  CO2 24  --   --   --  26  GLUCOSE 213*   < > 206*  --  178*  BUN 14   < > 13  --  11  CREATININE 0.93   < > 0.80  --  0.91  CALCIUM 8.7*  --   --   --  9.4  MG  --   --   --  1.8  --   PHOS  --   --   --  3.6  --    < > = values in this interval not displayed.    Lipid Panel:     Component Value  Date/Time   CHOL 230 (H) 04/13/2019 0257   TRIG 181 (H) 04/13/2019 0257   HDL 49 04/13/2019 0257   CHOLHDL 4.7 04/13/2019 0257   VLDL 36 04/13/2019 0257   LDLCALC 145 (H) 04/13/2019 0257   HgbA1c:  Lab Results  Component Value Date   HGBA1C 6.8 (H) 04/13/2019   Urine Drug Screen: No results found for: LABOPIA, COCAINSCRNUR, LABBENZ, AMPHETMU, THCU, LABBARB  Alcohol Level No results found for: [facility]  IMAGING  CT Code Stroke CTA Head W/WO contrast CT Code Stroke CTA Neck W/WO contrast 04/12/2019 IMPRESSION:   CTA neck:  1. The bilateral common and internal carotid arteries are patent within the neck without significant stenosis. Mild atherosclerosis within the carotid systems as described.  2. The right vertebral artery is developmentally diminutive but appears patent throughout the neck.  3. Dominant left vertebral artery patent within the neck without stenosis.   CTA head:  1. Evaluation limited by contrast bolus timing.  2. No intracranial large vessel occlusion.  3. Intracranial atherosclerotic disease with multifocal stenoses most notably as follows.  4. Apparent multifocal  stenoses within proximal M2 and more distal right MCA branch vessels. This includes sites of moderate/severe stenosis within a proximal right M2 branch.  5. Sites of up to moderate/severe stenosis within the proximal to mid A2 left anterior cerebral artery.  6. Sites of at least moderate stenosis within the distal posterior cerebral arteries bilaterally.   DG Chest 2 View 04/12/2019 IMPRESSION:  No acute cardiopulmonary findings.   MR BRAIN WO CONTRAST 04/12/2019 IMPRESSION:  1. 2.2 x 1.1 x 0.7 mm acute ischemic nonhemorrhagic left paramedian pontine infarct.  2. Underlying mild cerebral atrophy with moderate chronic small vessel ischemic disease.   CT HEAD CODE STROKE WO CONTRAST 04/12/2019 IMPRESSION:  1. Real versus artifactual lacunar infarct in the left pons.  2. No acute cortically based infarct or acute intracranial hemorrhage identified. ASPECTS 10.   ECHOCARDIOGRAM COMPLETE 04/13/2019 IMPRESSIONS   1. Left ventricular ejection fraction, by estimation, is 60 to 65%. The left ventricle has normal function. The left ventricle has no regional wall motion abnormalities. Left ventricular diastolic parameters are consistent with Grade I diastolic dysfunction (impaired relaxation).   2. Right ventricular systolic function is normal. The right ventricular size is normal.   3. Left atrial size was mildly dilated.   4. The mitral valve is normal in structure and function. No evidence of mitral valve regurgitation. No evidence of mitral stenosis.   5. The aortic valve is normal in structure and function. Aortic valve regurgitation is mild to moderate. Mild aortic valve sclerosis is present, with no evidence of aortic valve stenosis.   6. The inferior vena cava is normal in size with greater than 50% respiratory variability, suggesting right atrial pressure of 3 mmHg. Comparison(s): No prior Echocardiogram.    ECG - SR rate 74 BPM. (See cardiology reading for complete details)   PHYSICAL  EXAM Blood pressure (!) 150/96, pulse 69, temperature 97.8 F (36.6 C), temperature source Oral, resp. rate 19, height 5\' 7"  (1.702 m), weight 129.6 kg, SpO2 97 %. GENERAL: This is a morbidly obese male who is in no acute distress.  HEENT: He has a very large neck and crowded airway with a very large tongue.  There is mild injection of the right sclera.  ABDOMEN: soft  EXTREMITIES: No edema   BACK: Normal  SKIN: Normal by inspection.    MENTAL STATUS: Alert and oriented. Speech -mild to moderately dysarthric, language  and cognition are generally intact. Judgment and insight normal.   CRANIAL NERVES: Pupils are equal, round and reactive to light and accomodation; extra ocular movements are full, there is no significant nystagmus; visual fields are full; upper and lower facial muscles are normal in strength and symmetric, there is no flattening of the nasolabial folds; tongue is midline; uvula is midline; shoulder elevation is normal.  MOTOR: Right hand grip is 4/5 with the significantly impaired fine finger movements including finger tapping.  Triceps and biceps 4/5.  Right lower extremity 5.  Left side shows normal tone, bulk and strength.  COORDINATION: Left finger to nose is normal, right finger to nose is normal, No rest tremor; no intention tremor; no postural tremor; no bradykinesia.  SENSATION: Normal to light touch, temperature, and pain.        ASSESSMENT/PLAN Mr. Benjamin Rosales. is a 64 y.o. male with history of hypertension who presented to [facility] ED as a code stroke for slurred speech and right side weakness.  He did not receive IV t-PA due to late presentation (>4.5 hours from time of onset).   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  Resultant dysarthria and right upper extremity weakness.  Code Stroke CT Head - Real versus artifactual lacunar infarct in the left pons. No acute cortically based infarct or acute intracranial  hemorrhage identified. ASPECTS 10.   CT head - not ordered  MRI head - 2.2 x 1.1 x 0.7 mm acute ischemic nonhemorrhagic left paramedian pontine infarct. Underlying mild cerebral atrophy with moderate chronic small vessel ischemic disease.   MRA head - not ordered  CTA H&N - sites of moderate/severe stenosis within a proximal right M2 branch and proximal to mid A2 left anterior cerebral artery.   CT Perfusion - not ordered  Carotid Doppler - CTA neck performed - carotid dopplers not indicated.  2D Echo - EF 60 - 65%. No cardiac source of emboli identified.   Sars Corona Virus 2 - negative  LDL - 145  HgbA1c - 6.8  UDS - not ordered  VTE prophylaxis - Lovenox Diet  Diet Order            Diet heart healthy/carb modified Room service appropriate? Yes; Fluid consistency: Thin  Diet effective now              No antithrombotic prior to admission, now on aspirin 81 mg daily and clopidogrel 75 mg daily.  Aspirin monotherapy is sufficient after a week of dual antiplatelet agent.  Patient counseled to be compliant with his antithrombotic medications  Ongoing aggressive stroke risk factor management  Therapy recommendations:  Outpatient PT and OT recommended  Disposition:  Pending  Hypertension  Home BP meds: none   Current BP meds: Apresoline prn  Blood pressure somewhat high at times but within post stroke/TIA parameters . Permissive hypertension (OK if < 220/120) but gradually normalize in 5-7 days  . Long-term BP goal normotensive  Hyperlipidemia - new diagnosis  Home Lipid lowering medication: none  LDL145, goal < 70  Current lipid lowering medication: Lipitor 40 mg daily   Continue statin at discharge  Diabetes - new diagnosis  Home diabetic meds: none  Current diabetic meds: SSI  HgbA1c 6.8, goal < 7.0 Recent Labs    04/13/19 0625 04/13/19 1139 04/13/19 1559  GLUCAP 152* 177* 216*    Other Stroke Risk Factors  Advanced age  ETOH use,  advised to drink no more than  1 alcoholic beverage per day.  Obesity, Body mass index is 44.75 kg/m., recommend weight loss, diet and exercise as appropriate   Other Active Problems  Code status - Full code  ETOH hx - CIWA protocol  Likely obstructive sleep apnea syndrome:  Outpatient testing recommended   Hospital day # 1  1.  Left pontine infarct with right-sided weakness and dysarthria due to lacunar infarct.  Risk factors untreated hypertension, obesity and suspected obstructive sleep apnea syndrome.  Gradual blood pressure treatment is recommended.  To contact Stroke Continuity provider, please refer to [email or web address]. After hours, contact General Neurology

## 2019-04-13 NOTE — Progress Notes (Signed)
PT follow up BP 107/60 in left arm.  Pt asymptomatic at this time

## 2019-04-13 NOTE — Progress Notes (Signed)
PROGRESS NOTE    Benjamin BendJohn W Saintil Jr.  ZOX:[id number] DOB: [birth year] DOA: 04/12/2019 PCP: Jonita AlbeeGuest, Chris W, MD     Brief Narrative: Benjamin BendJohn W Veilleux Jr. is a 64 y.o. male with medical history significant of Morbid obesity, presented with new onset of right sided weakness and slurred speech. His symptoms started 3 days ago, initially was right arm and hand weakness, then later he started to feel right leg dragging and clumsy. He quite ignored the symptoms and did not seek medical attention. But last night he noticed that he had hard time to spit and this morning his girl friend found his speech was slurred and encouraged him come in. Denied any headache, no numbness on any limbs and no blurry vision, no chest pain or palpitations.    Assessment & Plan:  Clinical problems list 1.  Acute lacunar stroke 2.  Hypertensive urgency 3.  Alcohol abuse 4.  Diabetes mellitus type 2, likely new onset 5.  Hyperlipidemia  1.  Acute lacunar stroke.  Patient presenting with right hemiparesis and also speech difficulty in the setting of uncontrolled hypertension and alcohol abuse, which made him come to hospital  CT of the head initially was concerning for a versus artifactual lacunar infarct in the right pons. CTA head: A. Evaluation limited by contrast bolus timing. B. No intracranial large vessel occlusion. C. Intracranial atherosclerotic disease with multifocal stenoses most notably as follows. D. Apparent multifocal stenoses within proximal M2 and more distal right MCA branch vessels. This includes sites of moderate/severe stenosis within a proximal right M2 branch. E. Sites of up to moderate/severe stenosis within the proximal to mid A2 left anterior cerebral artery. F. Sites of at least moderate stenosis within the distal posterior cerebral arteries bilaterally. MRI of head showed a 2.2 x 1.1 x 0.7 mm acute ischemic nonhemorrhagic left paramedian pontine infarct. Patient started on  aspirin,Plavix and high intensity statin Continue with neurochecks Continue with physical therapy and Occupational Therapy  2.  Hypertensive urgency.  Blood pressure still markedly elevated. Patient has IV hydralazine along with p.o. to maintain permissive hypertension with blood pressure in the 170 -180 mmHg systolic Avoid excessive blood pressure control to preserve watershed circulation.  3.  Alcohol abuse.  Patient has no symptoms of withdrawal at present though markedly elevated blood pressure may be worrisome for autonomic instability in alcohol withdrawal. Willl continue with CIWA's protocol and supplementation with thiamine and folic acid.  4.  Diabetes mellitus type 2 likely new onset.  Hemoglobin A1c was 6.8. Patient started on sliding scale, fingersticks before meals and at bedtime and hypoglycemic protocol  5.  Hyperlipidemia.  Total serum cholesterol is 230 with LDL of 145 and HDL of 49. Continue with high intensity statin with atorvastatin 40 mg daily       DVT prophylaxis: Lovenox subacute  Code Status: Full code Family Communication: None at bedside  Disposition Plan: Patient came from home and will be discharged to home. Bariatric discharge include possibly evolving stroke.  Patient will need more than 2 days admission.   Consultants:  None at present   Procedures: None   Antimicrobials: None   Subjective: Patient was seen and examined at bedside.  He is laying quietly in bed and not in acute distress.  Is alert and oriented x4.  Has right right upper extremity paresis otherwise muscle strength is 5/5 left upper and bilateral lower extremity.  No facial asymmetry was noted.  Patient denies any chest pain, palpitation or diaphoresis and also  denies any bowel or urinary incontinence.  Objective: Vitals:   04/13/19 0121 04/13/19 0321 04/13/19 0801 04/13/19 1141  BP: (!) 191/107 (!) 184/109 (!) 194/99 107/60  Pulse: 63 64 70 81  Resp: 17 18 19 18   Temp:  98.3 F (36.8 C) 98.6 F (37 C) 98.7 F (37.1 C) 97.6 F (36.4 C)  TempSrc: Oral Oral Oral Oral  SpO2: 96% 96% 97% 98%  Weight:      Height:       No intake or output data in the 24 hours ending 04/13/19 1235 Filed Weights   04/12/19 1500  Weight: 129.6 kg    Examination:  General exam: Appears calm and comfortable  Respiratory system: Clear to auscultation. Respiratory effort normal. Cardiovascular system: S1 & S2 heard, RRR. No JVD, murmurs, rubs, gallops or clicks. No pedal edema. Gastrointestinal system: Abdomen is full, and soft and nontender. No organomegaly or masses felt. Normal bowel sounds heard. Central nervous system: Alert and oriented x4. Right upper extremity paresis.. Extremities: Muscle strength is 5/5 in left upper and bilateral lower extremities.  Speech is improved and coherent.. Skin: No rashes, lesions or ulcers Psychiatry: Judgement and insight appear normal. Mood & affect appropriate.     Data Reviewed: I have personally reviewed following labs and imaging studies  CBC: Recent Labs  Lab 04/12/19 1441 04/12/19 1447  WBC 6.1  --   NEUTROABS 4.1  --   HGB 15.3 15.0  HCT 44.6 44.0  MCV 90.5  --   PLT 180  --    Basic Metabolic Panel: Recent Labs  Lab 04/12/19 1441 04/12/19 1447 04/12/19 1928 04/13/19 0257  NA 134* 136  --  137  K 4.1 4.0  --  3.7  CL 104 101  --  101  CO2 24  --   --  26  GLUCOSE 213* 206*  --  178*  BUN 14 13  --  11  CREATININE 0.93 0.80  --  0.91  CALCIUM 8.7*  --   --  9.4  MG  --   --  1.8  --   PHOS  --   --  3.6  --    GFR: Estimated Creatinine Clearance: 110.3 mL/min (by C-G formula based on SCr of 0.91 mg/dL). Liver Function Tests: Recent Labs  Lab 04/12/19 1441  AST 33  ALT 59*  ALKPHOS 63  BILITOT 1.0  PROT 6.5  ALBUMIN 4.0   No results for input(s): LIPASE, AMYLASE in the last 168 hours. No results for input(s): AMMONIA in the last 168 hours. Coagulation Profile: Recent Labs  Lab  04/12/19 1441  INR 0.9   Cardiac Enzymes: No results for input(s): CKTOTAL, CKMB, CKMBINDEX, TROPONINI in the last 168 hours. BNP (last 3 results) No results for input(s): PROBNP in the last 8760 hours. HbA1C: Recent Labs    04/12/19 1441 04/13/19 0257  HGBA1C 6.9* 6.8*   CBG: Recent Labs  Lab 04/12/19 1440 04/12/19 2145 04/13/19 0625 04/13/19 1139  GLUCAP 204* 145* 152* 177*   Lipid Profile: Recent Labs    04/13/19 0257  CHOL 230*  HDL 49  LDLCALC 145*  TRIG 181*  CHOLHDL 4.7   Thyroid Function Tests: No results for input(s): TSH, T4TOTAL, FREET4, T3FREE, THYROIDAB in the last 72 hours. Anemia Panel: No results for input(s): VITAMINB12, FOLATE, FERRITIN, TIBC, IRON, RETICCTPCT in the last 72 hours. Sepsis Labs: No results for input(s): PROCALCITON, LATICACIDVEN in the last 168 hours.  Recent Results (from the past 240  hour(s))  Respiratory Panel by RT PCR (Flu A&B, Covid) - Nasopharyngeal Swab     Status: None   Collection Time: 04/12/19  7:24 PM   Specimen: Nasopharyngeal Swab  Result Value Ref Range Status   SARS Coronavirus 2 by RT PCR NEGATIVE NEGATIVE Final    Comment: (NOTE) SARS-CoV-2 target nucleic acids are NOT DETECTED. The SARS-CoV-2 RNA is generally detectable in upper respiratoy specimens during the acute phase of infection. The lowest concentration of SARS-CoV-2 viral copies this assay can detect is 131 copies/mL. A negative result does not preclude SARS-Cov-2 infection and should not be used as the sole basis for treatment or other patient management decisions. A negative result may occur with  improper specimen collection/handling, submission of specimen other than nasopharyngeal swab, presence of viral mutation(s) within the areas targeted by this assay, and inadequate number of viral copies (<131 copies/mL). A negative result must be combined with clinical observations, patient history, and epidemiological information. The expected result  is Negative. Fact Sheet for Patients:  PinkCheek.be Fact Sheet for Healthcare Providers:  GravelBags.it This test is not yet ap proved or cleared by the [place] FDA and  has been authorized for detection and/or diagnosis of SARS-CoV-2 by FDA under an Emergency Use Authorization (EUA). This EUA will remain  in effect (meaning this test can be used) for the duration of the COVID-19 declaration under Section 564(b)(1) of the Act, 21 U.S.C. section 360bbb-3(b)(1), unless the authorization is terminated or revoked sooner.    Influenza A by PCR NEGATIVE NEGATIVE Final   Influenza B by PCR NEGATIVE NEGATIVE Final    Comment: (NOTE) The Xpert Xpress SARS-CoV-2/FLU/RSV assay is intended as an aid in  the diagnosis of influenza from Nasopharyngeal swab specimens and  should not be used as a sole basis for treatment. Nasal washings and  aspirates are unacceptable for Xpert Xpress SARS-CoV-2/FLU/RSV  testing. Fact Sheet for Patients: PinkCheek.be Fact Sheet for Healthcare Providers: GravelBags.it This test is not yet approved or cleared by the [place] FDA and  has been authorized for detection and/or diagnosis of SARS-CoV-2 by  FDA under an Emergency Use Authorization (EUA). This EUA will remain  in effect (meaning this test can be used) for the duration of the  Covid-19 declaration under Section 564(b)(1) of the Act, 21  U.S.C. section 360bbb-3(b)(1), unless the authorization is  terminated or revoked. Performed at [facility], [place] [address] [place], [place] [address]          Radiology Studies: CT Code Stroke CTA Head W/WO contrast  Result Date: 04/12/2019 CLINICAL DATA:  Neuro deficit, acute, stroke suspected. EXAM: CT ANGIOGRAPHY HEAD AND NECK TECHNIQUE: Multidetector CT imaging of the head and neck was performed using the standard protocol during  bolus administration of intravenous contrast. Multiplanar CT image reconstructions and MIPs were obtained to evaluate the vascular anatomy. Carotid stenosis measurements (when applicable) are obtained utilizing NASCET criteria, using the distal internal carotid diameter as the denominator. CONTRAST:  127mL OMNIPAQUE IOHEXOL 350 MG/ML SOLN COMPARISON:  Noncontrast head CT performed earlier the same day 04/12/2019 FINDINGS: CTA NECK FINDINGS Aortic arch: Common origin of the innominate and left common carotid arteries. Mild calcified plaque within the visualized aortic arch and proximal major branch vessels of the neck. No significant innominate or proximal subclavian artery stenosis. Right carotid system: The CCA and ICA are patent within the neck without measurable stenosis. Mild predominantly soft plaque within the proximal right ICA. Left carotid system: CCA and ICA  patent within the neck without measurable stenosis. Mild mixed plaque within the distal CCA and carotid bifurcation. Vertebral arteries: The left vertebral artery is significantly dominant. The right vertebral artery is markedly diminutive on a developmental basis but appears patent throughout the neck. The left vertebral artery is patent within the neck without measurable stenosis. Skeleton: No acute bony abnormality. Other neck: No neck mass identified or cervical lymphadenopathy. Upper chest: No consolidation within the imaged lung apices. Review of the MIP images confirms the above findings CTA HEAD FINDINGS Evaluation is limited due to contrast bolus timing. Anterior circulation: The intracranial internal carotid arteries are patent bilaterally with mild calcified plaque but no significant stenosis. The M1 middle cerebral arteries are patent without significant stenosis. Contrast bolus timing limits evaluation of the M2 and more distal MCA branch vessels bilaterally. No M2 proximal branch occlusion is identified. There are apparent multifocal  stenoses within the M2 and more distal right MCA branch vessels. This includes sites of apparent moderate/severe stenosis within a proximal M2 right MCA branch (series 12, image 17). No left M2 high-grade stenosis is identified. The A1 right anterior cerebral artery is developmentally absent. The A1 left anterior cerebral artery is patent without significant stenosis and supplies the A2 right ACA. Sites of up to moderate/severe stenosis within the proximal to mid A2 left anterior cerebral artery (series 12, image 23). Sites of mild/moderate stenosis within the A2 right anterior cerebral artery and more distal bilateral anterior cerebral arteries. No intracranial aneurysm is identified Posterior circulation: The non dominant right vertebral artery is diminutive but patent and appears to terminate as the right PICA. The intracranial left vertebral artery is patent. Calcified plaque within this vessel with out significant stenosis. The left vertebral artery supplies the basilar artery which is patent without significant stenosis. The posterior cerebral arteries are patent. Sites of mild stenosis within the P2 PCA is bilaterally. Sites of at least moderate stenosis within the distal posterior cerebral arteries bilaterally. Venous sinuses: As permitted by contrast timing, patent. Anatomic variants: Posterior communicating arteries are poorly delineated and may be hypoplastic or absent bilaterally. Review of the MIP images confirms the above findings IMPRESSION: CTA neck: 1. The bilateral common and internal carotid arteries are patent within the neck without significant stenosis. Mild atherosclerosis within the carotid systems as described. 2. The right vertebral artery is developmentally diminutive but appears patent throughout the neck. 3. Dominant left vertebral artery patent within the neck without stenosis. CTA head: 1. Evaluation limited by contrast bolus timing. 2. No intracranial large vessel occlusion. 3.  Intracranial atherosclerotic disease with multifocal stenoses most notably as follows. 4. Apparent multifocal stenoses within proximal M2 and more distal right MCA branch vessels. This includes sites of moderate/severe stenosis within a proximal right M2 branch. 5. Sites of up to moderate/severe stenosis within the proximal to mid A2 left anterior cerebral artery. 6. Sites of at least moderate stenosis within the distal posterior cerebral arteries bilaterally. Electronically Signed   By: Jackey Loge DO   On: 04/12/2019 16:37   DG Chest 2 View  Result Date: 04/12/2019 CLINICAL DATA:  Hypertension, neuro deficit, stroke suspected EXAM: CHEST - 2 VIEW COMPARISON:  None. FINDINGS: No consolidation, features of edema, pneumothorax, or effusion. Pulmonary vascularity is normally distributed. The cardiomediastinal contours are unremarkable. No acute osseous or soft tissue abnormality. Degenerative changes are present in the imaged spine and shoulders. Telemetry leads overlie the chest. IMPRESSION: No acute cardiopulmonary findings. Electronically Signed   By: Coralie Keens.D.  On: 04/12/2019 19:37   CT Code Stroke CTA Neck W/WO contrast  Result Date: 04/12/2019 CLINICAL DATA:  Neuro deficit, acute, stroke suspected. EXAM: CT ANGIOGRAPHY HEAD AND NECK TECHNIQUE: Multidetector CT imaging of the head and neck was performed using the standard protocol during bolus administration of intravenous contrast. Multiplanar CT image reconstructions and MIPs were obtained to evaluate the vascular anatomy. Carotid stenosis measurements (when applicable) are obtained utilizing NASCET criteria, using the distal internal carotid diameter as the denominator. CONTRAST:  100mL OMNIPAQUE IOHEXOL 350 MG/ML SOLN COMPARISON:  Noncontrast head CT performed earlier the same day 04/12/2019 FINDINGS: CTA NECK FINDINGS Aortic arch: Common origin of the innominate and left common carotid arteries. Mild calcified plaque within the visualized  aortic arch and proximal major branch vessels of the neck. No significant innominate or proximal subclavian artery stenosis. Right carotid system: The CCA and ICA are patent within the neck without measurable stenosis. Mild predominantly soft plaque within the proximal right ICA. Left carotid system: CCA and ICA patent within the neck without measurable stenosis. Mild mixed plaque within the distal CCA and carotid bifurcation. Vertebral arteries: The left vertebral artery is significantly dominant. The right vertebral artery is markedly diminutive on a developmental basis but appears patent throughout the neck. The left vertebral artery is patent within the neck without measurable stenosis. Skeleton: No acute bony abnormality. Other neck: No neck mass identified or cervical lymphadenopathy. Upper chest: No consolidation within the imaged lung apices. Review of the MIP images confirms the above findings CTA HEAD FINDINGS Evaluation is limited due to contrast bolus timing. Anterior circulation: The intracranial internal carotid arteries are patent bilaterally with mild calcified plaque but no significant stenosis. The M1 middle cerebral arteries are patent without significant stenosis. Contrast bolus timing limits evaluation of the M2 and more distal MCA branch vessels bilaterally. No M2 proximal branch occlusion is identified. There are apparent multifocal stenoses within the M2 and more distal right MCA branch vessels. This includes sites of apparent moderate/severe stenosis within a proximal M2 right MCA branch (series 12, image 17). No left M2 high-grade stenosis is identified. The A1 right anterior cerebral artery is developmentally absent. The A1 left anterior cerebral artery is patent without significant stenosis and supplies the A2 right ACA. Sites of up to moderate/severe stenosis within the proximal to mid A2 left anterior cerebral artery (series 12, image 23). Sites of mild/moderate stenosis within the A2  right anterior cerebral artery and more distal bilateral anterior cerebral arteries. No intracranial aneurysm is identified Posterior circulation: The non dominant right vertebral artery is diminutive but patent and appears to terminate as the right PICA. The intracranial left vertebral artery is patent. Calcified plaque within this vessel with out significant stenosis. The left vertebral artery supplies the basilar artery which is patent without significant stenosis. The posterior cerebral arteries are patent. Sites of mild stenosis within the P2 PCA is bilaterally. Sites of at least moderate stenosis within the distal posterior cerebral arteries bilaterally. Venous sinuses: As permitted by contrast timing, patent. Anatomic variants: Posterior communicating arteries are poorly delineated and may be hypoplastic or absent bilaterally. Review of the MIP images confirms the above findings IMPRESSION: CTA neck: 1. The bilateral common and internal carotid arteries are patent within the neck without significant stenosis. Mild atherosclerosis within the carotid systems as described. 2. The right vertebral artery is developmentally diminutive but appears patent throughout the neck. 3. Dominant left vertebral artery patent within the neck without stenosis. CTA head: 1. Evaluation limited by  contrast bolus timing. 2. No intracranial large vessel occlusion. 3. Intracranial atherosclerotic disease with multifocal stenoses most notably as follows. 4. Apparent multifocal stenoses within proximal M2 and more distal right MCA branch vessels. This includes sites of moderate/severe stenosis within a proximal right M2 branch. 5. Sites of up to moderate/severe stenosis within the proximal to mid A2 left anterior cerebral artery. 6. Sites of at least moderate stenosis within the distal posterior cerebral arteries bilaterally. Electronically Signed   By: Jackey Loge DO   On: 04/12/2019 16:37   MR BRAIN WO CONTRAST  Result Date:  04/12/2019 CLINICAL DATA:  Follow-up examination for acute stroke. EXAM: MRI HEAD WITHOUT CONTRAST TECHNIQUE: Multiplanar, multiecho pulse sequences of the brain and surrounding structures were obtained without intravenous contrast. COMPARISON:  Prior CT and CTA from earlier the same day. FINDINGS: Brain: Generalized age-related cerebral atrophy. Patchy T2/FLAIR hyperintensity within the periventricular deep white matter both cerebral hemispheres most consistent with chronic small vessel ischemic disease, moderate in nature. Area of restricted diffusion measuring 2.2 x 1.1 x 0.7 mm seen involving the left paramedian pons (series 5, image 71), consistent with acute ischemic infarct. No associated hemorrhage or mass effect. No other evidence for acute or subacute ischemia. Gray-white matter differentiation otherwise maintained. No encephalomalacia to suggest chronic cortical infarction elsewhere within the brain. No foci of susceptibility artifact to suggest acute or chronic intracranial hemorrhage. No mass lesion, midline shift or mass effect. No hydrocephalus. No extra-axial fluid collection. Pituitary gland suprasellar region normal. Midline structures intact. Vascular: Major intracranial vascular flow voids are well maintained. Hypoplastic right vertebral artery noted. Skull and upper cervical spine: Craniocervical junction within normal limits. Upper cervical spine normal. Bone marrow signal intensity within normal limits. No scalp soft tissue abnormality. Sinuses/Orbits: Globes and orbital soft tissues demonstrate no acute finding. Small retention cyst noted within the maxillary sinuses bilaterally. Paranasal sinuses are otherwise clear. No mastoid effusion. Inner ear structures grossly normal. Other: None. IMPRESSION: 1. 2.2 x 1.1 x 0.7 mm acute ischemic nonhemorrhagic left paramedian pontine infarct. 2. Underlying mild cerebral atrophy with moderate chronic small vessel ischemic disease. Electronically Signed    By: Rise Mu M.D.   On: 04/12/2019 20:38   ECHOCARDIOGRAM COMPLETE  Result Date: 04/13/2019    ECHOCARDIOGRAM REPORT   Patient Name:   Adithya Difrancesco. Date of Exam: 04/13/2019 Medical Rec #:  [id number]          Height:       67.0 in Accession #:    [id number]         Weight:       285.7 lb Date of Birth:  February 11, 1956          BSA:          2.35 m Patient Age:    63 years           BP:           194/99 mmHg Patient Gender: M                  HR:           54 bpm. Exam Location:  Inpatient Procedure: 2D Echo Indications:    stroke 434.91  History:        Patient has no prior history of Echocardiogram examinations.                 Risk Factors:Hypertension.  Sonographer:    Delcie Roch Referring Phys: [id number] HYQM T Chipper Herb  IMPRESSIONS  1. Left ventricular ejection fraction, by estimation, is 60 to 65%. The left ventricle has normal function. The left ventricle has no regional wall motion abnormalities. Left ventricular diastolic parameters are consistent with Grade I diastolic dysfunction (impaired relaxation).  2. Right ventricular systolic function is normal. The right ventricular size is normal.  3. Left atrial size was mildly dilated.  4. The mitral valve is normal in structure and function. No evidence of mitral valve regurgitation. No evidence of mitral stenosis.  5. The aortic valve is normal in structure and function. Aortic valve regurgitation is mild to moderate. Mild aortic valve sclerosis is present, with no evidence of aortic valve stenosis.  6. The inferior vena cava is normal in size with greater than 50% respiratory variability, suggesting right atrial pressure of 3 mmHg. Comparison(s): No prior Echocardiogram. FINDINGS  Left Ventricle: Left ventricular ejection fraction, by estimation, is 60 to 65%. The left ventricle has normal function. The left ventricle has no regional wall motion abnormalities. The left ventricular internal cavity size was normal in size. There is  no left  ventricular hypertrophy. Left ventricular diastolic parameters are consistent with Grade I diastolic dysfunction (impaired relaxation). Normal left ventricular filling pressure. Right Ventricle: The right ventricular size is normal. No increase in right ventricular wall thickness. Right ventricular systolic function is normal. Left Atrium: Left atrial size was mildly dilated. Right Atrium: Right atrial size was normal in size. Pericardium: There is no evidence of pericardial effusion. Mitral Valve: The mitral valve is normal in structure and function. Normal mobility of the mitral valve leaflets. No evidence of mitral valve regurgitation. No evidence of mitral valve stenosis. Tricuspid Valve: The tricuspid valve is normal in structure. Tricuspid valve regurgitation is not demonstrated. No evidence of tricuspid stenosis. Aortic Valve: The aortic valve is normal in structure and function. Aortic valve regurgitation is mild to moderate. Mild aortic valve sclerosis is present, with no evidence of aortic valve stenosis. Pulmonic Valve: The pulmonic valve was not well visualized. Pulmonic valve regurgitation is not visualized. No evidence of pulmonic stenosis. Aorta: The aortic root is normal in size and structure. Venous: The inferior vena cava is normal in size with greater than 50% respiratory variability, suggesting right atrial pressure of 3 mmHg. IAS/Shunts: No atrial level shunt detected by color flow Doppler.  LEFT VENTRICLE PLAX 2D LVIDd:         5.00 cm  Diastology LVIDs:         2.90 cm  LV e' lateral:   8.49 cm/s LV PW:         1.20 cm  LV E/e' lateral: 9.1 LV IVS:        1.10 cm  LV e' medial:    5.98 cm/s LVOT diam:     2.20 cm  LV E/e' medial:  12.9 LV SV Index:   33.82 LVOT Area:     3.80 cm  RIGHT VENTRICLE RV S prime:     19.10 cm/s TAPSE (M-mode): 2.7 cm LEFT ATRIUM             Index       RIGHT ATRIUM           Index LA diam:        4.30 cm 1.83 cm/m  RA Area:     13.30 cm LA Vol (A2C):   67.0 ml  28.47 ml/m RA Volume:   29.60 ml  12.58 ml/m LA Vol (A4C):   57.9 ml 24.60 ml/m LA  Biplane Vol: 65.4 ml 27.79 ml/m   AORTA Ao Root diam: 4.20 cm MITRAL VALVE MV Area (PHT): 2.56 cm    SHUNTS MV Decel Time: 296 msec    Systemic Diam: 2.20 cm MV E velocity: 77.10 cm/s MV A velocity: 95.70 cm/s MV E/A ratio:  0.81 Mihai Croitoru MD Electronically signed by Thurmon Fair MD Signature Date/Time: 04/13/2019/11:29:01 AM    Final    CT HEAD CODE STROKE WO CONTRAST  Result Date: 04/12/2019 CLINICAL DATA:  Code stroke. 64 year old male with right side weakness and slurred speech. EXAM: CT HEAD WITHOUT CONTRAST TECHNIQUE: Contiguous axial images were obtained from the base of the skull through the vertex without intravenous contrast. COMPARISON:  None. FINDINGS: Brain: No midline shift, mass effect, or evidence of intracranial mass lesion. No acute intracranial hemorrhage identified. No ventriculomegaly. No cortically based infarct changes identified in the left hemisphere. Mild for age scattered white matter hypodensity, somewhat more apparent on the right. Real versus artifactual oval hypodensity in the left pons on series 3, image 12. Vascular: Calcified atherosclerosis at the skull base. Dominant left vertebral artery. No convincing asymmetric vascular hyperdensity. Skull: Negative. Sinuses/Orbits: Maxillary sinus mucous retention cysts, other Visualized paranasal sinuses and mastoids are clear. Other: No acute orbit or scalp soft tissue finding. ASPECTS [facility] Stroke Program Early CT Score) Total score (0-10 with 10 being normal): 10 IMPRESSION: 1. Real versus artifactual lacunar infarct in the left pons. 2. No acute cortically based infarct or acute intracranial hemorrhage identified. ASPECTS 10. 3. These results were communicated to Dr. Laurence Slate at 2:55 pm on 04/12/2019 by text page via the [facility] messaging system. Electronically Signed   By: Odessa Fleming M.D.   On: 04/12/2019 14:56        Scheduled Meds: .  aspirin  81 mg Oral Daily  . atorvastatin  40 mg Oral q1800  . clopidogrel  75 mg Oral Daily  . enoxaparin (LOVENOX) injection  40 mg Subcutaneous Q24H  . folic acid  1 mg Oral Daily  . insulin aspart  0-9 Units Subcutaneous TID WC  . multivitamin with minerals  1 tablet Oral Daily  . thiamine  100 mg Oral Daily   Or  . thiamine  100 mg Intravenous Daily  . vitamin B-12  100 mcg Oral Daily   Continuous Infusions:   LOS: 1 day    Time spent: 35 minutes    Aquilla Hacker, MD [facility] Pager [phone]   If 7PM-7AM, please contact night-coverage [email or web address] Password [facility] 04/13/2019, 12:35 PM

## 2019-04-13 NOTE — Progress Notes (Signed)
Pt BP elevated to 208/95 left arm lying 225/125 lying right arm Notified Dr awaiting new orders Gave pt Hydralazine 25mg  PO NOW  PT in to get pt out of bed slowly  Im present for ambulation Pt BP 181/95 left arm standing.  Pt denies any issues says no to headache or blurry vision  Pt continue to have somewhat of slurred speech.  Speech therapist due to see him soon

## 2019-04-13 NOTE — Progress Notes (Signed)
  Echocardiogram 2D Echocardiogram has been performed.  Delcie Roch 04/13/2019, 10:10 AM

## 2019-04-13 NOTE — Progress Notes (Signed)
Notified Charge Nurse of pt BP I expressed the fact that I Notified Dr as well Administering BP Hydralazine PO now

## 2019-04-13 NOTE — Evaluation (Signed)
Occupational Therapy Evaluation Patient Details Name: Benjamin Rosales. MRN: [id number] DOB: August 10, 1955 Today's Date: 04/13/2019    History of Present Illness 64 yo admitted with right weakness and slurred speech with left pontine infarct. PMHx: obesity, HTN   Clinical Impression   Pt PTA:  Pt living with s/o and reports independence with ADL. Pt currently with good recall for BEFAST method of identifying a CVA. Pt given red theraputty and handout with good carry over skills after demo and given [facility] handout performing exercises with RUE. Pt's RUE is non dominant. Pt would benefit from continued OT skilled services for RUE HEP and coordination. OT following acutely.    Follow Up Recommendations  Outpatient OT(If needed, OP OT neuro)    Equipment Recommendations  None recommended by OT    Recommendations for Other Services       Precautions / Restrictions Precautions Precautions: Fall Precaution Comments: permissive HTN <220/110 Restrictions Weight Bearing Restrictions: No      Mobility Bed Mobility Overal bed mobility: Modified Independent                Transfers Overall transfer level: Needs assistance   Transfers: Sit to/from Stand Sit to Stand: Supervision              Balance Overall balance assessment: Mild deficits observed, not formally tested                                         ADL either performed or assessed with clinical judgement   ADL Overall ADL's : Needs assistance/impaired Eating/Feeding: Set up;Sitting   Grooming: Supervision/safety;Standing   Upper Body Bathing: Supervision/ safety;Standing   Lower Body Bathing: Min guard;Minimal assistance;Sitting/lateral leans;Sit to/from stand   Upper Body Dressing : Supervision/safety;Sitting   Lower Body Dressing: Min guard;Minimal assistance;Sitting/lateral leans;Sit to/from stand;Cueing for safety   Toilet Transfer: Min guard;Cueing for safety   Toileting- Clothing  Manipulation and Hygiene: Min guard;Minimal assistance;Sitting/lateral lean;Sit to/from stand;Cueing for safety       Functional mobility during ADLs: Min guard;Minimal assistance;Cueing for safety General ADL Comments: Pt limited by decreased strength in RUE and poor coordination.     Vision Baseline Vision/History: No visual deficits Patient Visual Report: No change from baseline Vision Assessment?: No apparent visual deficits     Perception     Praxis      Pertinent Vitals/Pain Pain Assessment: No/denies pain     Hand Dominance     Extremity/Trunk Assessment Upper Extremity Assessment Upper Extremity Assessment: RUE deficits/detail RUE Deficits / Details: Shoulder 4/5 MM grade; elbow, wrist and hand 3 to 3+/5 MM grade RUE Coordination: decreased gross motor;decreased fine motor   Lower Extremity Assessment Lower Extremity Assessment: Overall WFL for tasks assessed   Cervical / Trunk Assessment Cervical / Trunk Assessment: Normal   Communication Communication Communication: Expressive difficulties   Cognition Arousal/Alertness: Awake/alert Behavior During Therapy: WFL for tasks assessed/performed Overall Cognitive Status: Within Functional Limits for tasks assessed                                     General Comments       Exercises Exercises: Other exercises Other Exercises Other Exercises: [facility] handout Other Exercises: red theraputty handout   Shoulder Instructions      Home Living Family/patient expects to be discharged to::  Private residence Living Arrangements: Alone Available Help at Discharge: Available 24 hours/day;Friend(s) Type of Home: Mobile home Home Access: Stairs to enter Entrance Stairs-Number of Steps: 4   Home Layout: One level     Bathroom Shower/Tub: Chief Strategy Officer: Standard     Home Equipment: None   Additional Comments: 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       Prior Functioning/Environment Level of Independence: Independent                 OT Problem List: Decreased strength;Decreased activity tolerance;Impaired balance (sitting and/or standing);Decreased coordination;Impaired UE functional use      OT Treatment/Interventions: Self-care/ADL training;Therapeutic exercise;Neuromuscular education;Energy conservation;DME and/or AE instruction;Therapeutic activities;Visual/perceptual remediation/compensation;Patient/family education;Balance training    OT Goals(Current goals can be found in the care plan section) Acute Rehab OT Goals Patient Stated Goal: return to home and work OT Goal Formulation: With patient Time For Goal Achievement: 04/27/19 Potential to Achieve Goals: Good ADL Goals Pt Will Perform Lower Body Dressing: with supervision;sit to/from stand Pt/caregiver will Perform Home Exercise Program: Increased strength;With written HEP provided;With theraputty;Right Upper extremity  OT Frequency: Min 2X/week   Barriers to D/C:            Co-evaluation              AM-PAC OT "6 Clicks" Daily Activity     Outcome Measure Help from another person eating meals?: None Help from another person taking care of personal grooming?: None Help from another person toileting, which includes using toliet, bedpan, or urinal?: None Help from another person bathing (including washing, rinsing, drying)?: A Little Help from another person to put on and taking off regular upper body clothing?: None Help from another person to put on and taking off regular lower body clothing?: A Little 6 Click Score: 22   End of Session Equipment Utilized During Treatment: Gait belt Nurse Communication: Mobility status  Activity Tolerance: Patient tolerated treatment well Patient left: in bed;with call bell/phone within reach  OT Visit Diagnosis: Unsteadiness on feet (R26.81);Muscle weakness (generalized) (M62.81)                Time:  9030-0923 OT Time Calculation (min): 24 min Charges:  OT General Charges $OT Visit: 1 Visit OT Evaluation $OT Eval Moderate Complexity: 1 Mod OT Treatments $Neuromuscular Re-education: 8-22 mins  Flora Lipps, OTR/L Acute Rehabilitation Services Pager: [phone] Office: [phone]   Lonzo Cloud 04/13/2019, 4:59 PM

## 2019-04-13 NOTE — Evaluation (Addendum)
Physical Therapy Evaluation Patient Details Name: Benjamin Rosales. MRN: [id number] DOB: 1955-03-28 Today's Date: 04/13/2019   History of Present Illness  64 yo admitted with right weakness and slurred speech with left pontine infarct. PMHx: obesity, HTN  Clinical Impression  PT pleasant and eager to get OOB. Pt reports no medical follow up for years despite urging from family. Pt with continued RUE weakness, decreased fine motor and coordination as well as noted speech deficits. Unable to fully assess balance this session due to BP near end of HTN parameter and will need to further assess. Pt with decreased mobility and gait and will benefit from acute therapy to maximize mobility and safety.   Pt educated for stroke signs/symptoms (BE FAST).  BP supine LUE 208/95 Standing 181/95    Follow Up Recommendations Outpatient PT    Equipment Recommendations  None recommended by PT    Recommendations for Other Services       Precautions / Restrictions Precautions Precautions: Fall Precaution Comments: permissive HTN <220/110      Mobility  Bed Mobility Overal bed mobility: Modified Independent                Transfers Overall transfer level: Needs assistance   Transfers: Sit to/from Stand Sit to Stand: Supervision            Ambulation/Gait Ambulation/Gait assistance: Min guard Gait Distance (Feet): 10 Feet Assistive device: None Gait Pattern/deviations: Step-through pattern;Decreased stride length   Gait velocity interpretation: 1.31 - 2.62 ft/sec, indicative of limited community ambulator General Gait Details: pt cautious and moving slowly with guarding for safety. Limited gait due to elevated bP  Stairs            Wheelchair Mobility    Modified Rankin (Stroke Patients Only) Modified Rankin (Stroke Patients Only) Pre-Morbid Rankin Score: No symptoms Modified Rankin: Moderate disability     Balance Overall balance assessment: Mild deficits  observed, not formally tested                                           Pertinent Vitals/Pain Pain Assessment: No/denies pain    Home Living Family/patient expects to be discharged to:: Private residence Living Arrangements: Alone Available Help at Discharge: Available 24 hours/day;Friend(s) Type of Home: Mobile home Home Access: Stairs to enter   Entrance Stairs-Number of Steps: 4 Home Layout: One level Home Equipment: None Additional Comments: 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    Prior Function Level of Independence: Independent               Hand Dominance        Extremity/Trunk Assessment   Upper Extremity Assessment Upper Extremity Assessment: RUE deficits/detail RUE Deficits / Details: decreased fine motor, 3/5 grip, 4/5 shoulder flexion    Lower Extremity Assessment Lower Extremity Assessment: Overall WFL for tasks assessed    Cervical / Trunk Assessment Cervical / Trunk Assessment: Normal  Communication   Communication: Expressive difficulties  Cognition Arousal/Alertness: Awake/alert Behavior During Therapy: WFL for tasks assessed/performed Overall Cognitive Status: Within Functional Limits for tasks assessed                                        General Comments      Exercises  Assessment/Plan    PT Assessment Patient needs continued PT services  PT Problem List Decreased strength;Decreased mobility;Decreased activity tolerance;Decreased balance       PT Treatment Interventions Gait training;Balance training;Stair training;Functional mobility training;Therapeutic activities;Patient/family education    PT Goals (Current goals can be found in the Care Plan section)  Acute Rehab PT Goals Patient Stated Goal: return to home and work PT Goal Formulation: With patient Time For Goal Achievement: 04/20/19 Potential to Achieve Goals: Good    Frequency Min 4X/week    Barriers to discharge Decreased caregiver support pt lives alone but reports girlfriend can assist    Co-evaluation               AM-PAC PT "6 Clicks" Mobility  Outcome Measure Help needed turning from your back to your side while in a flat bed without using bedrails?: None Help needed moving from lying on your back to sitting on the side of a flat bed without using bedrails?: None Help needed moving to and from a bed to a chair (including a wheelchair)?: A Little Help needed standing up from a chair using your arms (e.g., wheelchair or bedside chair)?: A Little Help needed to walk in hospital room?: A Little Help needed climbing 3-5 steps with a railing? : A Little 6 Click Score: 20    End of Session   Activity Tolerance: Patient tolerated treatment well Patient left: in chair;with call bell/phone within reach;with chair alarm set;with nursing/sitter in room Nurse Communication: Mobility status;Precautions PT Visit Diagnosis: Other abnormalities of gait and mobility (R26.89)    Time: 1030-1055 PT Time Calculation (min) (ACUTE ONLY): 25 min   Charges:   PT Evaluation $PT Eval Moderate Complexity: 1 Mod          Alexzia Kasler P, PT Acute Rehabilitation Services Pager: [phone] Office: [place] 04/13/2019, 12:48 PM

## 2019-04-14 DIAGNOSIS — E119 Type 2 diabetes mellitus without complications: Secondary | ICD-10-CM

## 2019-04-14 LAB — COMPREHENSIVE METABOLIC PANEL
ALT: 52 U/L — ABNORMAL HIGH (ref 0–44)
AST: 29 U/L (ref 15–41)
Albumin: 3.6 g/dL (ref 3.5–5.0)
Alkaline Phosphatase: 58 U/L (ref 38–126)
Anion gap: 9 (ref 5–15)
BUN: 11 mg/dL (ref 8–23)
CO2: 26 mmol/L (ref 22–32)
Calcium: 9.3 mg/dL (ref 8.9–10.3)
Chloride: 103 mmol/L (ref 98–111)
Creatinine, Ser: 0.94 mg/dL (ref 0.61–1.24)
GFR calc Af Amer: >60 mL/min (ref >60)
GFR calc non Af Amer: >60 mL/min (ref >60)
Glucose, Bld: 140 mg/dL — ABNORMAL HIGH (ref 70–99)
Potassium: 3.9 mmol/L (ref 3.5–5.1)
Sodium: 138 mmol/L (ref 135–145)
Total Bilirubin: 1 mg/dL (ref 0.3–1.2)
Total Protein: 6.1 g/dL — ABNORMAL LOW (ref 6.5–8.1)

## 2019-04-14 LAB — CBC
HCT: 41.6 % (ref 39.0–52.0)
Hemoglobin: 14.6 g/dL (ref 13.0–17.0)
MCH: 31.3 pg (ref 26.0–34.0)
MCHC: 35.1 g/dL (ref 30.0–36.0)
MCV: 89.1 fL (ref 80.0–100.0)
Platelets: 185 10*3/uL (ref 150–400)
RBC: 4.67 MIL/uL (ref 4.22–5.81)
RDW: 12.4 % (ref 11.5–15.5)
WBC: 5.9 10*3/uL (ref 4.0–10.5)
nRBC: 0 % (ref 0.0–0.2)

## 2019-04-14 LAB — GLUCOSE, CAPILLARY
Glucose-Capillary: 128 mg/dL — ABNORMAL HIGH (ref 70–99)
Glucose-Capillary: 194 mg/dL — ABNORMAL HIGH (ref 70–99)

## 2019-04-14 LAB — PHOSPHORUS: Phosphorus: 4.3 mg/dL (ref 2.5–4.6)

## 2019-04-14 LAB — MAGNESIUM: Magnesium: 2 mg/dL (ref 1.7–2.4)

## 2019-04-14 MED ORDER — HYDRALAZINE HCL 25 MG PO TABS: 25.0000 mg | ORAL_TABLET | Freq: Three times a day (TID) | ORAL | 5 refills | Status: DC

## 2019-04-14 MED ORDER — FOLIC ACID 1 MG PO TABS: 1.0000 mg | ORAL_TABLET | Freq: Every day | ORAL | 0 refills | Status: DC

## 2019-04-14 MED ORDER — SENNOSIDES-DOCUSATE SODIUM 8.6-50 MG PO TABS: 1.0000 | ORAL_TABLET | Freq: Every evening | ORAL | 0 refills | Status: AC | PRN

## 2019-04-14 MED ORDER — ASPIRIN 81 MG PO CHEW: 81.0000 mg | CHEWABLE_TABLET | Freq: Every day | ORAL | 11 refills | Status: AC

## 2019-04-14 MED ORDER — THIAMINE HCL 100 MG PO TABS: 100.0000 mg | ORAL_TABLET | Freq: Every day | ORAL | 5 refills | Status: DC

## 2019-04-14 MED ORDER — METFORMIN HCL 500 MG PO TABS: 500.0000 mg | ORAL_TABLET | Freq: Two times a day (BID) | ORAL | 11 refills | Status: DC

## 2019-04-14 MED ORDER — CLOPIDOGREL BISULFATE 75 MG PO TABS: 75.0000 mg | ORAL_TABLET | Freq: Every day | ORAL | 0 refills | Status: AC

## 2019-04-14 MED ORDER — AMLODIPINE BESYLATE 10 MG PO TABS: 10.0000 mg | ORAL_TABLET | Freq: Every day | ORAL | 11 refills | Status: DC

## 2019-04-14 MED ORDER — HYDROCHLOROTHIAZIDE 12.5 MG PO TABS: 25.0000 mg | ORAL_TABLET | Freq: Every day | ORAL | 0 refills | Status: DC

## 2019-04-14 MED ORDER — ATORVASTATIN CALCIUM 40 MG PO TABS: 40.0000 mg | ORAL_TABLET | Freq: Every day | ORAL | 11 refills | Status: DC

## 2019-04-14 NOTE — Care Management CC44 (Signed)
Referrals placed to [facility] for OP PT OT.

## 2019-04-14 NOTE — Progress Notes (Signed)
STROKE TEAM PROGRESS NOTE   HISTORY OF PRESENT ILLNESS (per record) Benjamin Rosales. is an 64 y.o. male  With no PMH who presented to [facility] ED as a code stroke for slurred speech and right side weakness.  Patient stated that he started feeling weakness on right side. This morning he woke up and wasn't feeling quite right. He noticed that he couldn't spit as forcefully while brushing his teeth. He called his girlfriend and she noticed that his speech was slurred. EMS was called. Denies any smoking, drug abuse, CP, SOB. Endorses drinking ETOH about 2 shots of 100 proof liquor per night. He states that he did stop drinking as of  Monday.  ED course:  CTH: no hemorrhage CTA: no LVO Creatinine: 0.80 BG: 206 BP 160/110 Date last known well: 04/09/19 Time last known well: unknown tPA Given: no outside of window Modified Rankin: Rankin Score=0 NIHSS:5   INTERVAL HISTORY The patient's wife is at the bedside. His symptoms have improved modestly especially the dysarthria.  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  There is marked periventricular leukoencephalopathy and deep white matter changes consistent with chronic microvascular disease.  This is likely due to chronic uncontrolled/untreated hypertension.  OBJECTIVE Vitals:   04/13/19 2328 04/14/19 0355 04/14/19 0734 04/14/19 1147  BP: (!) 177/90 (!) 158/88 (!) 165/97 (!) 195/109  Pulse: 63 61 67 (!) 59  Resp: 17 17 17 17   Temp: (!) 97.5 F (36.4 C) 97.8 F (36.6 C) 97.6 F (36.4 C) 97.9 F (36.6 C)  TempSrc: Oral Oral Oral Oral  SpO2: 94% 95% 93% 96%  Weight:      Height:        CBC:  Recent Labs  Lab 04/12/19 1441 04/12/19 1441 04/12/19 1447 04/14/19 0452  WBC 6.1  --   --  5.9  NEUTROABS 4.1  --   --   --   HGB 15.3   < > 15.0 14.6  HCT 44.6   < > 44.0 41.6  MCV 90.5  --   --  89.1  PLT 180  --   --  185   < > = values in this interval not displayed.    Basic  Metabolic Panel:  Recent Labs  Lab 04/12/19 1447 04/12/19 1928 04/13/19 0257 04/14/19 0452  NA   < >  --  137 138  K   < >  --  3.7 3.9  CL   < >  --  101 103  CO2   < >  --  26 26  GLUCOSE   < >  --  178* 140*  BUN   < >  --  11 11  CREATININE   < >  --  0.91 0.94  CALCIUM   < >  --  9.4 9.3  MG  --  1.8  --  2.0  PHOS  --  3.6  --  4.3   < > = values in this interval not displayed.    Lipid Panel:     Component Value Date/Time   CHOL 230 (H) 04/13/2019 0257   TRIG 181 (H) 04/13/2019 0257   HDL 49 04/13/2019 0257   CHOLHDL 4.7 04/13/2019 0257   VLDL 36 04/13/2019 0257   LDLCALC 145 (H) 04/13/2019 0257   HgbA1c:  Lab Results  Component Value Date   HGBA1C 6.8 (H) 04/13/2019   Urine Drug Screen: No results found for: LABOPIA,  COCAINSCRNUR, LABBENZ, AMPHETMU, THCU, LABBARB  Alcohol Level No results found for: Brent  CT Code Stroke CTA Head W/WO contrast CT Code Stroke CTA Neck W/WO contrast 04/12/2019 IMPRESSION:   CTA neck:  1. The bilateral common and internal carotid arteries are patent within the neck without significant stenosis. Mild atherosclerosis within the carotid systems as described.  2. The right vertebral artery is developmentally diminutive but appears patent throughout the neck.  3. Dominant left vertebral artery patent within the neck without stenosis.   CTA head:  1. Evaluation limited by contrast bolus timing.  2. No intracranial large vessel occlusion.  3. Intracranial atherosclerotic disease with multifocal stenoses most notably as follows.  4. Apparent multifocal stenoses within proximal M2 and more distal right MCA branch vessels. This includes sites of moderate/severe stenosis within a proximal right M2 branch.  5. Sites of up to moderate/severe stenosis within the proximal to mid A2 left anterior cerebral artery.  6. Sites of at least moderate stenosis within the distal posterior cerebral arteries bilaterally.   DG Chest 2  View 04/12/2019 IMPRESSION:  No acute cardiopulmonary findings.   MR BRAIN WO CONTRAST 04/12/2019 IMPRESSION:  1. 2.2 x 1.1 x 0.7 mm acute ischemic nonhemorrhagic left paramedian pontine infarct.  2. Underlying mild cerebral atrophy with moderate chronic small vessel ischemic disease.   CT HEAD CODE STROKE WO CONTRAST 04/12/2019 IMPRESSION:  1. Real versus artifactual lacunar infarct in the left pons.  2. No acute cortically based infarct or acute intracranial hemorrhage identified. ASPECTS 10.   ECHOCARDIOGRAM COMPLETE 04/13/2019 IMPRESSIONS   1. Left ventricular ejection fraction, by estimation, is 60 to 65%. The left ventricle has normal function. The left ventricle has no regional wall motion abnormalities. Left ventricular diastolic parameters are consistent with Grade I diastolic dysfunction (impaired relaxation).   2. Right ventricular systolic function is normal. The right ventricular size is normal.   3. Left atrial size was mildly dilated.   4. The mitral valve is normal in structure and function. No evidence of mitral valve regurgitation. No evidence of mitral stenosis.   5. The aortic valve is normal in structure and function. Aortic valve regurgitation is mild to moderate. Mild aortic valve sclerosis is present, with no evidence of aortic valve stenosis.   6. The inferior vena cava is normal in size with greater than 50% respiratory variability, suggesting right atrial pressure of 3 mmHg. Comparison(s): No prior Echocardiogram.    ECG - SR rate 74 BPM. (See cardiology reading for complete details)   PHYSICAL EXAM Blood pressure (!) 195/109, pulse (!) 59, temperature 97.9 F (36.6 C), temperature source Oral, resp. rate 17, height 5\' 7"  (1.702 m), weight 129.6 kg, SpO2 96 %. GENERAL: This is a morbidly obese male who is in no acute distress.  HEENT: He has a very large neck and crowded airway with a very large tongue.  There is mild injection of the right  sclera.  ABDOMEN: soft  EXTREMITIES: No edema   BACK: Normal  SKIN: Normal by inspection.    MENTAL STATUS: Alert and oriented. Speech -mild to moderately dysarthric, language and cognition are generally intact. Judgment and insight normal.   CRANIAL NERVES: Pupils are equal, round and reactive to light and accomodation; extra ocular movements are full, there is no significant nystagmus; visual fields are full; upper and lower facial muscles are normal in strength and symmetric, there is no flattening of the nasolabial folds; tongue is midline; uvula is midline; shoulder elevation is  normal.  MOTOR: Right hand grip is 4/5 with the significantly impaired fine finger movements including finger tapping.  Triceps and biceps 4/5.  Right lower extremity 5.  Left side shows normal tone, bulk and strength.  COORDINATION: Left finger to nose is normal, right finger to nose is normal, No rest tremor; no intention tremor; no postural tremor; no bradykinesia.  SENSATION: Normal to light touch, temperature, and pain.   ASSESSMENT/PLAN Mr. Dayan Desa. is a 64 y.o. male with history of hypertension who presented to [facility] ED as a code stroke for slurred speech and right side weakness.  He did not receive IV t-PA due to late presentation (>4.5 hours from time of onset).   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  Resultant dysarthria and right upper extremity weakness.  Code Stroke CT Head - Real versus artifactual lacunar infarct in the left pons. No acute cortically based infarct or acute intracranial hemorrhage identified. ASPECTS 10.   CT head - not ordered  MRI head - 2.2 x 1.1 x 0.7 mm acute ischemic nonhemorrhagic left paramedian pontine infarct. Underlying mild cerebral atrophy with moderate chronic small vessel ischemic disease.   MRA head - not ordered  CTA H&N - sites of moderate/severe stenosis within a proximal right M2 branch and proximal to  mid A2 left anterior cerebral artery.   CT Perfusion - not ordered  Carotid Doppler - CTA neck performed - carotid dopplers not indicated.  2D Echo - EF 60 - 65%. No cardiac source of emboli identified.   Sars Corona Virus 2 - negative  LDL - 145  HgbA1c - 6.8  UDS - not ordered  VTE prophylaxis - Lovenox Diet  Diet Order            Diet - low sodium heart healthy        Diet heart healthy/carb modified Room service appropriate? Yes; Fluid consistency: Thin  Diet effective now              No antithrombotic prior to admission, now on aspirin 81 mg daily and clopidogrel 75 mg daily.  Aspirin monotherapy is sufficient after a week of dual antiplatelet agent.  Patient counseled to be compliant with his antithrombotic medications  Ongoing aggressive stroke risk factor management  Therapy recommendations:  Outpatient PT and OT recommended  Disposition:  Pending  Hypertension  Home BP meds: none   Current BP meds: Apresoline prn  Blood pressure somewhat high at times but within post stroke/TIA parameters . Permissive hypertension (OK if < 220/120) but gradually normalize in 5-7 days  . Long-term BP goal normotensive  Hyperlipidemia - new diagnosis  Home Lipid lowering medication: none  LDL145, goal < 70  Current lipid lowering medication: Lipitor 40 mg daily   Continue statin at discharge  Diabetes - new diagnosis  Home diabetic meds: none  Current diabetic meds: SSI  HgbA1c 6.8, goal < 7.0 Recent Labs    04/13/19 2113 04/14/19 0607 04/14/19 1150  GLUCAP 193* 128* 194*    Other Stroke Risk Factors  Advanced age  ETOH use, advised to drink no more than 1 alcoholic beverage per day.  Obesity, Body mass index is 44.75 kg/m., recommend weight loss, diet and exercise as appropriate   Other Active Problems  Code status - Full code  ETOH hx - CIWA protocol  Likely obstructive sleep apnea syndrome:  Outpatient testing  recommended   Hospital day # 2  1.  Left pontine infarct with right-sided weakness and dysarthria due to lacunar infarct.  Risk factors untreated hypertension, obesity and suspected obstructive sleep apnea syndrome.  Gradual blood pressure treatment is recommended.  Okay for discharge and follow-up with stroke clinic in 1 month.  To contact Stroke Continuity provider, please refer to [email or web address]. After hours, contact General Neurology

## 2019-04-14 NOTE — Progress Notes (Signed)
Patient and Girlfriend instructed on AVS.  Follow up instructions for medications, appointments, and signs and symptoms of stoke educated on and when to call MD/EMS.  All belongings sent with patient at this time.  All questions answered.

## 2019-04-14 NOTE — Plan of Care (Signed)
  Problem: Education: Goal: Knowledge of disease or condition will improve Outcome: Adequate for Discharge Goal: Knowledge of secondary prevention will improve Outcome: Adequate for Discharge Goal: Knowledge of patient specific risk factors addressed and post discharge goals established will improve Outcome: Adequate for Discharge Goal: Individualized Educational Video(s) Outcome: Adequate for Discharge   Problem: Health Behavior/Discharge Planning: Goal: Ability to manage health-related needs will improve Outcome: Adequate for Discharge   Problem: Self-Care: Goal: Ability to participate in self-care as condition permits will improve Outcome: Adequate for Discharge Goal: Ability to communicate needs accurately will improve Outcome: Adequate for Discharge   Problem: Nutrition: Goal: Risk of aspiration will decrease Outcome: Adequate for Discharge   Problem: Ischemic Stroke/TIA Tissue Perfusion: Goal: Complications of ischemic stroke/TIA will be minimized Outcome: Adequate for Discharge   Problem: Education: Goal: Knowledge of General Education information will improve Description: Including pain rating scale, medication(s)/side effects and non-pharmacologic comfort measures Outcome: Adequate for Discharge   Problem: Health Behavior/Discharge Planning: Goal: Ability to manage health-related needs will improve Outcome: Adequate for Discharge   Problem: Clinical Measurements: Goal: Ability to maintain clinical measurements within normal limits will improve Outcome: Adequate for Discharge Goal: Will remain free from infection Outcome: Adequate for Discharge Goal: Diagnostic test results will improve Outcome: Adequate for Discharge Goal: Respiratory complications will improve Outcome: Adequate for Discharge Goal: Cardiovascular complication will be avoided Outcome: Adequate for Discharge   Problem: Activity: Goal: Risk for activity intolerance will decrease Outcome:  Adequate for Discharge   Problem: Nutrition: Goal: Adequate nutrition will be maintained Outcome: Adequate for Discharge   Problem: Coping: Goal: Level of anxiety will decrease Outcome: Adequate for Discharge   Problem: Elimination: Goal: Will not experience complications related to bowel motility Outcome: Adequate for Discharge Goal: Will not experience complications related to urinary retention Outcome: Adequate for Discharge   Problem: Pain Managment: Goal: General experience of comfort will improve Outcome: Adequate for Discharge   Problem: Safety: Goal: Ability to remain free from injury will improve Outcome: Adequate for Discharge   Problem: Skin Integrity: Goal: Risk for impaired skin integrity will decrease Outcome: Adequate for Discharge   

## 2019-04-14 NOTE — Progress Notes (Signed)
Physical Therapy Treatment Patient Details Name: Benjamin Rosales. MRN: [id number] DOB: [birth year] Today's Date: 04/14/2019    History of Present Illness 64 yo admitted with right weakness and slurred speech with left pontine infarct. PMHx: obesity, HTN    PT Comments    Patient progressing well towards PT goals. Continues to report right sided weakness. Tolerated gait training with close Min guard for balance/safety secondary to right knee instability and weakness. Requires a few standing rest breaks due to fatigue/weakness with pt reaching for rail at times. Encouraged functional use of RUE as well as fine motor exercises. Will continue to follow.    Follow Up Recommendations  Outpatient PT;Supervision - Intermittent     Equipment Recommendations  None recommended by PT    Recommendations for Other Services       Precautions / Restrictions Precautions Precautions: Fall Precaution Comments: permissive HTN <220/110 Restrictions Weight Bearing Restrictions: No    Mobility  Bed Mobility Overal bed mobility: Modified Independent             General bed mobility comments: Use of rail, no assist needed.  Transfers Overall transfer level: Needs assistance Equipment used: None Transfers: Sit to/from Stand Sit to Stand: Min guard         General transfer comment: Min guard for safety. Stood from [employer].  Ambulation/Gait Ambulation/Gait assistance: Min guard Gait Distance (Feet): 120 Feet Assistive device: None Gait Pattern/deviations: Step-through pattern;Decreased stride length;Wide base of support Gait velocity: decreased Gait velocity interpretation: 1.31 - 2.62 ft/sec, indicative of limited community ambulator General Gait Details: Slow, guarded gait with right knee instability, a few standing rest breaks. Reaching for rail at times for support. 2/4 DOE. VSS.   Stairs             Wheelchair Mobility    Modified Rankin (Stroke Patients  Only) Modified Rankin (Stroke Patients Only) Pre-Morbid Rankin Score: No symptoms Modified Rankin: Moderate disability     Balance Overall balance assessment: Needs assistance Sitting-balance support: Feet supported;No upper extremity supported Sitting balance-Leahy Scale: Good Sitting balance - Comments: supervision for safety   Standing balance support: During functional activity Standing balance-Leahy Scale: Fair Standing balance comment: Feels better having something to hold onto for longer distances.                            Cognition Arousal/Alertness: Awake/alert Behavior During Therapy: WFL for tasks assessed/performed Overall Cognitive Status: Within Functional Limits for tasks assessed                                        Exercises      General Comments General comments (skin integrity, edema, etc.): Speech continues to be dysarthric      Pertinent Vitals/Pain Pain Assessment: No/denies pain    Home Living                      Prior Function            PT Goals (current goals can now be found in the care plan section) Progress towards PT goals: Progressing toward goals    Frequency    Min 4X/week      PT Plan Current plan remains appropriate    Co-evaluation              AM-PAC PT "6  Clicks" Mobility   Outcome Measure  Help needed turning from your back to your side while in a flat bed without using bedrails?: None Help needed moving from lying on your back to sitting on the side of a flat bed without using bedrails?: None Help needed moving to and from a bed to a chair (including a wheelchair)?: A Little Help needed standing up from a chair using your arms (e.g., wheelchair or bedside chair)?: A Little Help needed to walk in hospital room?: A Little Help needed climbing 3-5 steps with a railing? : A Little 6 Click Score: 20    End of Session   Activity Tolerance: Patient tolerated treatment  well Patient left: in bed;with call bell/phone within reach;with bed alarm set Nurse Communication: Mobility status PT Visit Diagnosis: Other abnormalities of gait and mobility (R26.89)     Time: 4961-1643 PT Time Calculation (min) (ACUTE ONLY): 16 min  Charges:  $Gait Training: 8-22 mins                     Vale Haven, PT, DPT Acute Rehabilitation Services Pager [phone] Office [phone]       Blake Divine A Lanier Ensign 04/14/2019, 11:52 AM

## 2019-04-14 NOTE — Evaluation (Signed)
Speech Language Pathology Evaluation Patient Details Name: Benjamin Rosales. MRN: [id number] DOB: Jan 24, 1956 Today's Date: 04/14/2019 Time: 4401-0272 SLP Time Calculation (min) (ACUTE ONLY): 27 min  Problem List:  Patient Active Problem List   Diagnosis Date Noted  . Lacunar stroke (HCC) 04/12/2019  . Acute ischemic stroke (HCC) 04/12/2019   Past Medical History:  Past Medical History:  Diagnosis Date  . Hypertension    Past Surgical History:  Past Surgical History:  Procedure Laterality Date  . TONSILLECTOMY     HPI:  [name], 63y/m, found to have an acute lacunar stroke with right hemiparesis and speech difficulty in the setting of uncontrolled hypertension and alcohol abuse. PMH of DM type 2 likely new onset, hyperlipidemia and HTN. No medical care for 25 years.    Assessment / Plan / Recommendation Clinical Impression  Cognitive language evaluation completed at bedside with wife present. MOCA assessment given with a score of 26/30 which in within normal limits. All mistakes made were in the area of new information recall, short term memory. Some strategies were reviewed and practiced. Patient was able to implement strategies on following task. He doesn't feel he needs therapy to address memory. Speech Therapy is not recommended in the acute setting. Out patient speech therapy may be appropriate to address memory issues if they continue to be an issue for his work or daily activities.    SLP Assessment  SLP Recommendation/Assessment: All further Speech Lanaguage Pathology  needs can be addressed in the next venue of care SLP Visit Diagnosis: Cognitive communication deficit (R41.841)    Follow Up Recommendations  Outpatient SLP(if pt is willing)               SLP Evaluation Cognition  Overall Cognitive Status: Within Functional Limits for tasks assessed Arousal/Alertness: Awake/alert Orientation Level: Oriented X4 Attention: Focused;Sustained;Selective Focused  Attention: Appears intact Sustained Attention: Appears intact Selective Attention: Appears intact Memory: Impaired Memory Impairment: Decreased recall of new information Immediate Memory Recall: Sock;Blue;Bed Memory Recall Sock: Without Cue Memory Recall Blue: Not able to recall Memory Recall Bed: Not able to recall Awareness: Appears intact Problem Solving: Appears intact Executive Function: Reasoning;Sequencing Reasoning: Appears intact Sequencing: Appears intact Safety/Judgment: Appears intact       Comprehension  Auditory Comprehension Overall Auditory Comprehension: Appears within functional limits for tasks assessed Yes/No Questions: Within Functional Limits Commands: Within Functional Limits Conversation: Complex Visual Recognition/Discrimination Discrimination: Within Function Limits Reading Comprehension Reading Status: Within funtional limits    Expression Expression Primary Mode of Expression: Verbal Verbal Expression Overall Verbal Expression: Appears within functional limits for tasks assessed Initiation: No impairment Level of Generative/Spontaneous Verbalization: Conversation Repetition: No impairment Naming: No impairment Pragmatics: No impairment Written Expression Dominant Hand: Left Written Expression: Within Functional Limits   Oral / Motor  Oral Motor/Sensory Function Overall Oral Motor/Sensory Function: Within functional limits Motor Speech Overall Motor Speech: Appears within functional limits for tasks assessed Respiration: Within functional limits Phonation: Normal Resonance: Within functional limits Articulation: Within functional limitis Intelligibility: Intelligible Motor Planning: Witnin functional limits Motor Speech Errors: Not applicable   GO                    Benjamin Rosales., MA, CCC-SLP 04/14/2019, 3:10 PM

## 2019-04-14 NOTE — Discharge Summary (Signed)
Physician Discharge Summary  Patient ID: Benjamin Rosales. MRN: [id number] DOB/AGE: [birth year] 64 y.o.  Admit date: 04/12/2019 Discharge date: 04/14/2019  Admission Diagnoses: Acute lacunar stroke  Discharge Diagnoses:  Active Problems:   Lacunar stroke (HCC)   Acute ischemic stroke [facility])   Discharged Condition: stable  Hospital Course:  Benjamin Rosalesis a 64 y.o.malewith medical history significant ofMorbid obesity, presented with new onset of right sided weakness and slurred speech. His symptoms started 3 days ago, initially was right arm and hand weakness, then later he started to feel right leg dragging and clumsy. He quite ignored the symptoms and did not seek medical attention. But last night he noticed that he had hard time to spit and this morning his girl friend found his speech was slurred and encouraged him come in. Denied any headache, no numbness on any limbs and no blurry vision, no chest pain or palpitations  Assessment  Clinical problems list 1.  Acute lacunar stroke 2.  Hypertensive urgency 3.  Alcohol abuse 4.  Diabetes mellitus type 2, likely new onset 5.  Hyperlipidemia  1.  Acute lacunar stroke.  Patient presenting with right hemiparesis and also speech difficulty in the setting of uncontrolled hypertension and alcohol abuse, which made him come to hospital  CT of the head initially was concerning for a artifactual lacunar infarct in the right pons. CTA head: A. Evaluation limited by contrast bolus timing. B. No intracranial large vessel occlusion. C. Intracranial atherosclerotic disease with multifocal stenoses most notably as follows. D. Apparent multifocal stenoses within proximal M2 and more distal right MCA branch vessels. This includes sites of moderate/severe stenosis within a proximal right M2 branch. E. Sites of up to moderate/severe stenosis within the proximal to mid A2 left anterior cerebral artery. F. Sites of at least moderate  stenosis within the distal posterior cerebral arteries bilaterally. MRI of head showed a 2.2 x 1.1 x 0.7 mm acute ischemic nonhemorrhagic left paramedian pontine infarct. Patient started on aspirin,Plavix and high intensity statin Neurology evaluated and recommended Plavix 75 mg for 7 days and then continue with aspirin monotherapy 81 mg daily Patient has been walking with physical therapy while admission with recommendation for continuation of outpatient/home physical therapy. Consult has been placed to transition of care team for set up of home physical therapy. Patient was counseled on cessation of alcohol consumption to not more than 1 drink a day. He is to continue taking his aspirin and statin as prescribed and to follow-up with his primary care physician in 1 week.  2.  Hypertensive urgency.  His blood pressure was fairly controlled and he is discharged home on hydralazine 25 mg 3 times daily and amlodipine 10 mg daily. I will add hydrochlorothiazide 25 mg daily  Need to follow-up with his primary care physician in 1 week   3.  Alcohol abuse.  Patient has no symptoms of withdrawal at present  [facility]'s protocol was instituted while on admission. Patient was counseled on alcohol consumption and the need to decrease his alcohol intake to no more than 1 drink a day. He is given prescription for thiamine, folic acid and multivitamin.   4.  Diabetes mellitus type 2 likely new onset.  Hemoglobin A1c was 6.8 Patient is newly diagnosed with diabetes morbid A1c within goal of less than 7. I started him on Metformin 500 mg twice daily. He needs follow-up with his primary care physician for continued follow-up and monitoring..  5.  Hyperlipidemia.  Total serum cholesterol  is 230 with LDL of 145 and HDL of 49. Continue with high intensity statin with atorvastatin 40 mg daily   Consults: Neurology  Significant Diagnostic Studies:  CTA neck:  1. The bilateral common and internal carotid  arteries are patent within the neck without significant stenosis. Mild atherosclerosis within the carotid systems as described.  2. The right vertebral artery is developmentally diminutive but appears patent throughout the neck.  3. Dominant left vertebral artery patent within the neck without stenosis.   CTA head:  1. Evaluation limited by contrast bolus timing.  2. No intracranial large vessel occlusion.  3. Intracranial atherosclerotic disease with multifocal stenoses most notably as follows.  4. Apparent multifocal stenoses within proximal M2 and more distal right MCA branch vessels. This includes sites of moderate/severe stenosis within a proximal right M2 branch.  5. Sites of up to moderate/severe stenosis within the proximal to mid A2 left anterior cerebral artery.  6. Sites of at least moderate stenosis within the distal posterior cerebral arteries bilaterally.   DG Chest 2 View 04/12/2019 IMPRESSION:  No acute cardiopulmonary findings.   MR BRAIN WO CONTRAST 04/12/2019 IMPRESSION:  1. 2.2 x 1.1 x 0.7 mm acute ischemic nonhemorrhagic left paramedian pontine infarct.  2. Underlying mild cerebral atrophy with moderate chronic small vessel ischemic disease.   CT HEAD CODE STROKE WO CONTRAST 04/12/2019 IMPRESSION:  1. Real versus artifactual lacunar infarct in the left pons.  2. No acute cortically based infarct or acute intracranial hemorrhage identified. ASPECTS 10.   ECHOCARDIOGRAM COMPLETE 04/13/2019 IMPRESSIONS   1. Left ventricular ejection fraction, by estimation, is 60 to 65%. The left ventricle has normal function. The left ventricle has no regional wall motion abnormalities. Left ventricular diastolic parameters are consistent with Grade I diastolic dysfunction (impaired relaxation).   2. Right ventricular systolic function is normal. The right ventricular size is normal.   3. Left atrial size was mildly dilated.   4. The mitral valve is normal in structure and  function. No evidence of mitral valve regurgitation. No evidence of mitral stenosis.   5. The aortic valve is normal in structure and function. Aortic valve regurgitation is mild to moderate. Mild aortic valve sclerosis is present, with no evidence of aortic valve stenosis.   6. The inferior vena cava is normal in size with greater than 50% respiratory variability, suggesting right atrial pressure of 3 mmHg. Comparison(s): No prior Echocardiogram.    Treatments: Aspirin, Plavix, statin and physical/Occupational Therapy  Discharge Exam: Blood pressure (!) 195/109, pulse (!) 59, temperature 97.9 F (36.6 C), temperature source Oral, resp. rate 17, height 5\' 7"  (1.702 m), weight 129.6 kg, SpO2 96 %. General appearance: alert, cooperative and no distress Eyes: conjunctivae/corneas clear. PERRL, EOM's intact. Fundi benign. Nose: Nares normal. Septum midline. Mucosa normal. No drainage or sinus tenderness. Neck: no adenopathy, no carotid bruit, no JVD, supple, symmetrical, trachea midline and thyroid not enlarged, symmetric, no tenderness/mass/nodules Resp: clear to auscultation bilaterally Cardio: regular rate and rhythm, S1, S2 normal, no murmur, click, rub or gallop GI: soft, non-tender; bowel sounds normal; no masses,  no organomegaly Extremities: extremities normal, atraumatic, no cyanosis or edema Skin: Skin color, texture, turgor normal. No rashes or lesions Neurologic: Alert and oriented X 3, normal strength and tone. Normal symmetric reflexes. Normal coordination and gait Motor: Right upper extremity weakness  Disposition: Discharge disposition: 01-Home or Self Care       Discharge Instructions    Diet - low sodium heart healthy   Complete  by: As directed    Discharge instructions   Complete by: As directed    You will need to take Plavix for 7 days and stop Per neurology recommendation.  You are to continue with aspirin 81 mg daily indefinitely.  You are to take atorvastatin 40  mg daily indefinitely.  You have a new diagnosis of diabetes mellitus but your hemoglobin A1c is within goal.  I have started you on Metformin 500 mg twice daily.  You need to follow-up with your primary care physician in 1 week.  You must stop excessive alcohol consumption and not more than 1 drink per day. If symptoms recur, go to the nearest emergency department.   Increase activity slowly   Complete by: As directed      Allergies as of 04/14/2019   No Known Allergies     Medication List    STOP taking these medications   naproxen sodium 220 MG tablet Commonly known as: ALEVE     TAKE these medications   aspirin 81 MG chewable tablet Chew 1 tablet (81 mg total) by mouth daily. Start taking on: April 15, 2019   atorvastatin 40 MG tablet Commonly known as: LIPITOR Take 1 tablet (40 mg total) by mouth daily at 6 PM.   clopidogrel 75 MG tablet Commonly known as: PLAVIX Take 1 tablet (75 mg total) by mouth daily for 7 days. Start taking on: April 14, 930   folic acid 1 MG tablet Commonly known as: FOLVITE Take 1 tablet (1 mg total) by mouth daily. Start taking on: April 15, 2019   hydrALAZINE 25 MG tablet Commonly known as: APRESOLINE Take 1 tablet (25 mg total) by mouth 3 (three) times daily.   metFORMIN 500 MG tablet Commonly known as: Glucophage Take 1 tablet (500 mg total) by mouth 2 (two) times daily with a meal.   multivitamin tablet Take 1 tablet by mouth daily.   senna-docusate 8.6-50 MG tablet Commonly known as: Senokot-S Take 1 tablet by mouth at bedtime as needed for up to 10 days for mild constipation.   thiamine 100 MG tablet Take 1 tablet (100 mg total) by mouth daily. Start taking on: April 15, 2019   VITAMIN B12 PO Take 1 tablet by mouth daily.      Withdrawal time spent on this discharge encounter is 38 minutes  Signed: Elie Confer 04/14/2019, 2:19 PM

## 2019-04-24 ENCOUNTER — Ambulatory Visit (INDEPENDENT_AMBULATORY_CARE_PROVIDER_SITE_OTHER): Payer: BC Managed Care – PPO | Admitting: Family Medicine

## 2019-04-24 ENCOUNTER — Encounter: Payer: Self-pay | Admitting: Family Medicine

## 2019-04-24 ENCOUNTER — Other Ambulatory Visit: Payer: Self-pay

## 2019-04-24 VITALS — BP 140/80 | HR 92 | Resp 16 | Ht 67.0 in | Wt 297.0 lb

## 2019-04-24 DIAGNOSIS — I6381 Other cerebral infarction due to occlusion or stenosis of small artery: Secondary | ICD-10-CM | POA: Diagnosis not present

## 2019-04-24 DIAGNOSIS — K429 Umbilical hernia without obstruction or gangrene: Secondary | ICD-10-CM

## 2019-04-24 DIAGNOSIS — E1151 Type 2 diabetes mellitus with diabetic peripheral angiopathy without gangrene: Secondary | ICD-10-CM | POA: Diagnosis not present

## 2019-04-24 DIAGNOSIS — Z23 Encounter for immunization: Secondary | ICD-10-CM | POA: Diagnosis not present

## 2019-04-24 DIAGNOSIS — G4733 Obstructive sleep apnea (adult) (pediatric): Secondary | ICD-10-CM

## 2019-04-24 NOTE — Patient Instructions (Addendum)
A few things to remember from today's visit:   DM (diabetes mellitus), type 2 with peripheral vascular complications (HCC) - Plan: Amb Referral to Nutrition and Diabetic E  Lacunar stroke (HCC) - Plan: Ambulatory referral to Physical Therapy, CANCELED: Ambulatory referral to Physical Therapy  Umbilical hernia without obstruction and without gangrene - Plan: Ambulatory referral to General Surgery  OSA (obstructive sleep apnea) - Plan: Ambulatory referral to Pulmonology   Diabetes Mellitus and Nutrition, Adult When you have diabetes (diabetes mellitus), it is very important to have healthy eating habits because your blood sugar (glucose) levels are greatly affected by what you eat and drink. Eating healthy foods in the appropriate amounts, at about the same times every day, can help you:  Control your blood glucose.  Lower your risk of heart disease.  Improve your blood pressure.  Reach or maintain a healthy weight. Every person with diabetes is different, and each person has different needs for a meal plan. Your health care provider may recommend that you work with a diet and nutrition specialist (dietitian) to make a meal plan that is best for you. Your meal plan may vary depending on factors such as:  The calories you need.  The medicines you take.  Your weight.  Your blood glucose, blood pressure, and cholesterol levels.  Your activity level.  Other health conditions you have, such as heart or kidney disease. How do carbohydrates affect me? Carbohydrates, also called carbs, affect your blood glucose level more than any other type of food. Eating carbs naturally raises the amount of glucose in your blood. Carb counting is a method for keeping track of how many carbs you eat. Counting carbs is important to keep your blood glucose at a healthy level, especially if you use insulin or take certain oral diabetes medicines. It is important to know how many carbs you can safely have in  each meal. This is different for every person. Your dietitian can help you calculate how many carbs you should have at each meal and for each snack. Foods that contain carbs include:  Bread, cereal, rice, pasta, and crackers.  Potatoes and corn.  Peas, beans, and lentils.  Milk and yogurt.  Fruit and juice.  Desserts, such as cakes, cookies, ice cream, and candy. How does alcohol affect me? Alcohol can cause a sudden decrease in blood glucose (hypoglycemia), especially if you use insulin or take certain oral diabetes medicines. Hypoglycemia can be a life-threatening condition. Symptoms of hypoglycemia (sleepiness, dizziness, and confusion) are similar to symptoms of having too much alcohol. If your health care provider says that alcohol is safe for you, follow these guidelines:  Limit alcohol intake to no more than 1 drink per day for nonpregnant women and 2 drinks per day for men. One drink equals 12 oz of beer, 5 oz of wine, or 1 oz of hard liquor.  Do not drink on an empty stomach.  Keep yourself hydrated with water, diet soda, or unsweetened iced tea.  Keep in mind that regular soda, juice, and other mixers may contain a lot of sugar and must be counted as carbs. What are tips for following this plan?  Reading food labels  Start by checking the serving size on the "Nutrition Facts" label of packaged foods and drinks. The amount of calories, carbs, fats, and other nutrients listed on the label is based on one serving of the item. Many items contain more than one serving per package.  Check the total grams (g) of  carbs in one serving. You can calculate the number of servings of carbs in one serving by dividing the total carbs by 15. For example, if a food has 30 g of total carbs, it would be equal to 2 servings of carbs.  Check the number of grams (g) of saturated and trans fats in one serving. Choose foods that have low or no amount of these fats.  Check the number of  milligrams (mg) of salt (sodium) in one serving. Most people should limit total sodium intake to less than 2,300 mg per day.  Always check the nutrition information of foods labeled as "low-fat" or "nonfat". These foods may be higher in added sugar or refined carbs and should be avoided.  Talk to your dietitian to identify your daily goals for nutrients listed on the label. Shopping  Avoid buying canned, premade, or processed foods. These foods tend to be high in fat, sodium, and added sugar.  Shop around the outside edge of the grocery store. This includes fresh fruits and vegetables, bulk grains, fresh meats, and fresh dairy. Cooking  Use low-heat cooking methods, such as baking, instead of high-heat cooking methods like deep frying.  Cook using healthy oils, such as olive, canola, or sunflower oil.  Avoid cooking with butter, cream, or high-fat meats. Meal planning  Eat meals and snacks regularly, preferably at the same times every day. Avoid going long periods of time without eating.  Eat foods high in fiber, such as fresh fruits, vegetables, beans, and whole grains. Talk to your dietitian about how many servings of carbs you can eat at each meal.  Eat 4-6 ounces (oz) of lean protein each day, such as lean meat, chicken, fish, eggs, or tofu. One oz of lean protein is equal to: ? 1 oz of meat, chicken, or fish. ? 1 egg. ?  cup of tofu.  Eat some foods each day that contain healthy fats, such as avocado, nuts, seeds, and fish. Lifestyle  Check your blood glucose regularly.  Exercise regularly as told by your health care provider. This may include: ? 150 minutes of moderate-intensity or vigorous-intensity exercise each week. This could be brisk walking, biking, or water aerobics. ? Stretching and doing strength exercises, such as yoga or weightlifting, at least 2 times a week.  Take medicines as told by your health care provider.  Do not use any products that contain nicotine  or tobacco, such as cigarettes and e-cigarettes. If you need help quitting, ask your health care provider.  Work with a Social worker or diabetes educator to identify strategies to manage stress and any emotional and social challenges. Questions to ask a health care provider  Do I need to meet with a diabetes educator?  Do I need to meet with a dietitian?  What number can I call if I have questions?  When are the best times to check my blood glucose? Where to find more information:  American Diabetes Association: diabetes.org  Academy of Nutrition and Dietetics: www.eatright.[employer] of Diabetes and Digestive and Kidney Diseases (NIH): [employer] Summary  A healthy meal plan will help you control your blood glucose and maintain a healthy lifestyle.  Working with a diet and nutrition specialist (dietitian) can help you make a meal plan that is best for you.  Keep in mind that carbohydrates (carbs) and alcohol have immediate effects on your blood glucose levels. It is important to count carbs and to use alcohol carefully. This information is not intended to replace  advice given to you by your health care provider. Make sure you discuss any questions you have with your health care provider. Document Revised: 01/27/2017 Document Reviewed: 03/21/2016 Elsevier Patient Education  2020 [employer].  Please be sure medication list is accurate. If a new problem present, please set up appointment sooner than planned today.

## 2019-04-24 NOTE — Progress Notes (Signed)
HPI:   [name] is a 64 y.o. male, who is here today with his friend to establish care and for hospital follow up.  Former PCP: N/A Last preventive routine visit: 20+ years ago.  Chronic medical problems: Alcoholism,CVA, HLD, HTN,DM II, and obesity among some. Recent hospitalization,from 04/12/19 to 2/14/2 due to 3 days of right side weakness. Brain MRI shoed acute lacunar ischemic stroke.  1. 2.2 x 1.1 x 0.7 mm acute ischemic nonhemorrhagic left paramedian pontine infarct.  2. Underlying mild cerebral atrophy with moderate chronic small vessel ischemic disease.  Started on Aspirin,Plavix x 7 d,and statin.  Residual slurred speech and RUE/RLE weakness. Speech is getting better.  According to pt,PT has not been arranged. He has appt with neurologist.  HTN: He is on Hydralazine 25 mg tid and Amlodipine 10 mg daily.  Negative for severe/frequent headache, visual changes, chest pain, dyspnea, palpitation, claudication, new focal weakness, or edema.  He is not checking BP at home. He just ordered a BP monitor.  Lab Results  Component Value Date   CREATININE 0.94 04/14/2019   BUN 11 04/14/2019   NA 138 04/14/2019   K 3.9 04/14/2019   CL 103 04/14/2019   CO2 26 04/14/2019   Quit alcohol intake 04/2019. During hospitalization he was dx;ed with DM II.  Lab Results  Component Value Date   HGBA1C 6.8 (H) 04/13/2019  On Metformin 500 mg bid.  BS's 150-160's. Negative for polydipsia,polyuria, or polyphagia.  HLD: He is on Atorvastatin 40 mg daily.  Lab Results  Component Value Date   CHOL 230 (H) 04/13/2019   HDL 49 04/13/2019   LDLCALC 145 (H) 04/13/2019   TRIG 181 (H) 04/13/2019   CHOLHDL 4.7 42/68/3419   Years of umbilical hernia,getting worse. Occasionally he has some pain. Negative for changes in bowel habits,N/V,or blood in stool. He has not had a colonoscopy of colon cancer screening.  When inquired about hx of OSA, his friend  mentions that she noted some episodes of apnea while he was in the hospital.  Review of Systems  Constitutional: Negative for activity change, appetite change, fatigue and fever.  HENT: Negative for mouth sores, nosebleeds and sore throat.   Eyes: Negative for redness and visual disturbance.  Respiratory: Negative for cough and wheezing.   Cardiovascular: Negative for palpitations.  Gastrointestinal: Negative for abdominal pain, nausea and vomiting.  Genitourinary: Negative for decreased urine volume, dysuria and hematuria.  Neurological: Negative for syncope, facial asymmetry and numbness.  Rest see pertinent positives and negatives per HPI.   Current Outpatient Medications on File Prior to Visit  Medication Sig Dispense Refill  . amLODipine (NORVASC) 10 MG tablet Take 1 tablet (10 mg total) by mouth daily. 30 tablet 11  . aspirin 81 MG chewable tablet Chew 1 tablet (81 mg total) by mouth daily. 30 tablet 11  . atorvastatin (LIPITOR) 40 MG tablet Take 1 tablet (40 mg total) by mouth daily at 6 PM. 30 tablet 11  . Cyanocobalamin (VITAMIN B12 PO) Take 1 tablet by mouth daily.    . folic acid (FOLVITE) 1 MG tablet Take 1 tablet (1 mg total) by mouth daily. 30 tablet 0  . hydrALAZINE (APRESOLINE) 25 MG tablet Take 1 tablet (25 mg total) by mouth 3 (three) times daily. 90 tablet 5  . hydrochlorothiazide (HYDRODIURIL) 12.5 MG tablet Take 2 tablets (25 mg total) by mouth daily. 60 tablet 0  . metFORMIN (GLUCOPHAGE) 500 MG tablet Take 1 tablet (  500 mg total) by mouth 2 (two) times daily with a meal. 60 tablet 11  . Multiple Vitamin (MULTIVITAMIN) tablet Take 1 tablet by mouth daily.    Marland Kitchen thiamine 100 MG tablet Take 1 tablet (100 mg total) by mouth daily. 30 tablet 5   No current facility-administered medications on file prior to visit.     Past Medical History:  Diagnosis Date  . Hypertension    No Known Allergies  Family History  Problem Relation Age of Onset  . High blood pressure  Father   . High blood pressure Brother     Social History   Socioeconomic History  . Marital status: Single    Spouse name: Not on file  . Number of children: Not on file  . Years of education: Not on file  . Highest education level: Not on file  Occupational History  . Not on file  Tobacco Use  . Smoking status: Never Smoker  . Smokeless tobacco: Never Used  Substance and Sexual Activity  . Alcohol use: Yes    Alcohol/week: 28.0 standard drinks    Types: 28 Shots of liquor per week    Comment: 1 pint of liquor per day  . Drug use: Never  . Sexual activity: Not on file  Other Topics Concern  . Not on file  Social History Narrative  . Not on file   Social Determinants of Health   Financial Resource Strain:   . Difficulty of Paying Living Expenses: Not on file  Food Insecurity:   . Worried About Programme researcher, broadcasting/film/video in the Last Year: Not on file  . Ran Out of Food in the Last Year: Not on file  Transportation Needs:   . Lack of Transportation (Medical): Not on file  . Lack of Transportation (Non-Medical): Not on file  Physical Activity:   . Days of Exercise per Week: Not on file  . Minutes of Exercise per Session: Not on file  Stress:   . Feeling of Stress : Not on file  Social Connections:   . Frequency of Communication with Friends and Family: Not on file  . Frequency of Social Gatherings with Friends and Family: Not on file  . Attends Religious Services: Not on file  . Active Member of Clubs or Organizations: Not on file  . Attends Banker Meetings: Not on file  . Marital Status: Not on file    Vitals:   04/24/19 1500  BP: 140/80  Pulse: 92  Resp: 16  SpO2: 97%   Body mass index is 46.52 kg/m.   Physical Exam  Nursing note and vitals reviewed. Constitutional: He is oriented to person, place, and time. He appears well-developed. No distress.  HENT:  Head: Normocephalic and atraumatic.  Mouth/Throat: Oropharynx is clear and moist and  mucous membranes are normal.  Eyes: Pupils are equal, round, and reactive to light. Conjunctivae are normal.  Neck:  Short,wide neck.  Cardiovascular: Normal rate and regular rhythm.  No murmur heard. Pulses:      Dorsalis pedis pulses are 2+ on the right side and 2+ on the left side.  Respiratory: Effort normal and breath sounds normal. No respiratory distress.  GI: Soft. He exhibits no mass. There is no hepatomegaly. There is no abdominal tenderness. A hernia (umbilical hernia) is present.  Able to reduce partially, mild tenderness upon doing so.  Musculoskeletal:        General: No edema.  Lymphadenopathy:    He has no cervical  adenopathy.  Neurological: He is alert and oriented to person, place, and time. He has normal strength. No cranial nerve deficit. Gait normal.  Skin: Skin is warm. No rash noted. No erythema.  Psychiatric: He has a normal mood and affect.  Well groomed, good eye contact.   Diabetic Foot Exam - Simple   Simple Foot Form Diabetic Foot exam was performed with the following findings: Yes 04/24/2019  3:45 PM  Visual Inspection See comments: Yes Sensation Testing Intact to touch and monofilament testing bilaterally: Yes Pulse Check Posterior Tibialis and Dorsalis pulse intact bilaterally: Yes Comments Hypertrophic toenail.     ASSESSMENT AND PLAN:  Mr. Michelle was seen today for establish care.  Diagnoses and all orders for this visit:  DM (diabetes mellitus), type 2 with peripheral vascular complications (HCC) HgA1C at goal. No changes in current management. Regular exercise and healthy diet with avoidance of added sugar food intake is an important part of treatment and recommended. Annual eye exam, periodic dental and foot care recommended.  -     Amb Referral to Nutrition and Diabetic E -     Microalbumin / creatinine urine ratio  Lacunar stroke (HCC) Still some residual deficit. PT will be arranged. Completed PLacix 75 mg daily x 7 d. Continue  Aspirin 81 mg and Atorvastatin 40 mg.  -     Ambulatory referral to Physical Therapy  Umbilical hernia without obstruction and without gangrene Instructed about warning signs.  -     Ambulatory referral to General Surgery  OSA (obstructive sleep apnea) Adverse effects of OSA discussed.  -     Ambulatory referral to Pulmonology  Need for Tdap vaccination -     Tdap vaccine greater than or equal to 7yo IM  Need for pneumococcal vaccination -     Pneumococcal polysaccharide vaccine 23-valent greater than or equal to 2yo subcutaneous/IM   Return in about 4 weeks (around 05/22/2019) for CVA,HTN.     Elmor Kost G. [place], MD  [facility]. [place] office.

## 2019-04-29 ENCOUNTER — Other Ambulatory Visit: Payer: Self-pay

## 2019-04-30 ENCOUNTER — Other Ambulatory Visit: Payer: BC Managed Care – PPO

## 2019-04-30 ENCOUNTER — Telehealth: Payer: Self-pay | Admitting: Family Medicine

## 2019-04-30 DIAGNOSIS — E1151 Type 2 diabetes mellitus with diabetic peripheral angiopathy without gangrene: Secondary | ICD-10-CM | POA: Diagnosis not present

## 2019-04-30 LAB — MICROALBUMIN / CREATININE URINE RATIO
Creatinine,U: 157.3 mg/dL
Microalb Creat Ratio: 0.8 mg/g (ref 0.0–30.0)
Microalb, Ur: 1.2 mg/dL (ref 0.0–1.9)

## 2019-04-30 MED ORDER — HYDROCHLOROTHIAZIDE 12.5 MG PO TABS: 25.0000 mg | ORAL_TABLET | Freq: Every day | ORAL | 1 refills | Status: DC

## 2019-04-30 NOTE — Telephone Encounter (Signed)
Pt is requesting a refill on Hydrochlorothiazide 12.5 mg tablets sent to [facility] pharmacy on [address]. Thanks

## 2019-04-30 NOTE — Telephone Encounter (Signed)
Rx sent in as requested. 

## 2019-04-30 NOTE — Addendum Note (Signed)
Addended by: Philemon Kingdom on: 04/30/2019 08:40 AM   Modules accepted: Orders

## 2019-05-01 ENCOUNTER — Inpatient Hospital Stay: Payer: BC Managed Care – PPO

## 2019-05-02 ENCOUNTER — Other Ambulatory Visit: Payer: Self-pay

## 2019-05-02 ENCOUNTER — Ambulatory Visit: Payer: BC Managed Care – PPO | Attending: Family Medicine

## 2019-05-02 ENCOUNTER — Ambulatory Visit: Payer: BC Managed Care – PPO | Admitting: Occupational Therapy

## 2019-05-02 DIAGNOSIS — R2681 Unsteadiness on feet: Secondary | ICD-10-CM | POA: Diagnosis present

## 2019-05-02 DIAGNOSIS — I69353 Hemiplegia and hemiparesis following cerebral infarction affecting right non-dominant side: Secondary | ICD-10-CM | POA: Diagnosis present

## 2019-05-02 DIAGNOSIS — R278 Other lack of coordination: Secondary | ICD-10-CM | POA: Insufficient documentation

## 2019-05-02 DIAGNOSIS — M6281 Muscle weakness (generalized): Secondary | ICD-10-CM | POA: Diagnosis present

## 2019-05-02 DIAGNOSIS — R209 Unspecified disturbances of skin sensation: Secondary | ICD-10-CM | POA: Diagnosis present

## 2019-05-02 DIAGNOSIS — R208 Other disturbances of skin sensation: Secondary | ICD-10-CM

## 2019-05-02 DIAGNOSIS — R2689 Other abnormalities of gait and mobility: Secondary | ICD-10-CM | POA: Diagnosis not present

## 2019-05-02 NOTE — Therapy (Signed)
[facility] [address] [place], [place], [address] Phone: [phone]   Fax:  [phone]  Occupational Therapy Evaluation  Patient Details  Name: Benjamin Rosales. MRN: [id number] Date of Birth: 1956-01-09 No data recorded  Encounter Date: 05/02/2019  OT End of Session - 05/02/19 1321    Visit Number  1    Number of Visits  17    Date for OT Re-Evaluation  07/02/19    Authorization Type  BC/BS [employer] - 20 VL each discipline    Authorization - Visit Number  1    Authorization - Number of Visits  20    OT Start Time  1230    OT Stop Time  1315    OT Time Calculation (min)  45 min    Activity Tolerance  Patient tolerated treatment well    Behavior During Therapy  WFL for tasks assessed/performed       Past Medical History:  Diagnosis Date  . Hypertension     Past Surgical History:  Procedure Laterality Date  . TONSILLECTOMY      There were no vitals filed for this visit.  Subjective Assessment - 05/02/19 1235    Patient is accompanied by:  --   girlfriend   Pertinent History  lacunar stroke w/ Rt hemiparesis 04/12/19. PMH: obesity, T2DM, HTN, HLD, ETOH abuse, chronic small vessel ischemic dz    Limitations  driving    Currently in Pain?  Yes   Rt knee - O.T. not addressing directly - see PT eval       [facility] OT Assessment - 05/02/19 1237      Assessment   Medical Diagnosis  lacunar infarct    Onset Date/Surgical Date  04/12/19    Hand Dominance  Left    Next MD Visit  05/20/19 Dr. Marjory Lies    Prior Therapy  a little in hospital      Precautions   Precaution Comments  newly diabetic and just got glucometer and started on metformin, no driving      Home  Environment   Bathroom Shower/Tub  Tub/Shower unit;Curtain   Pt showering at girlfriends for safety w/ DME   Additional Comments  Pt lives in single wide trailer w/ 5 steps to enter.     Lives With  Alone      Prior Function   Level of  Independence  Independent    Vocation  Full time employment    Print production planner at [employer], golf      ADL   Eating/Feeding  --   assist to cut food   Grooming  Independent    Upper Body Bathing  Set up   w/ DME tub bench (seated)    Lower Body Bathing  Set up   seated w/ DME at girlfriends house   Upper Body Dressing  Independent   no buttons   Lower Body Dressing  Increased time   does not wear socks and wears slip on shoes   Toilet Transfer  Independent    Toileting - Clothing Manipulation  Independent    Toileting -  Hygiene  Independent    Tub/Shower Transfer  Supervision/safety   does sponge bathing in between    ADL comments  Girlfriends house has better set up for bathing with tub transfer bench      IADL   Shopping  Needs to be accompanied on any shopping trip  Light Housekeeping  Does personal laundry completely;Performs light daily tasks such as dishwashing, bed making    Meal Prep  Plans, prepares and serves adequate meals independently    Community Mobility  Relies on family or friends for transportation   for now   Medication Management  Is responsible for taking medication in correct dosages at correct time    Physicist, medical financial matters independently (budgets, writes checks, pays rent, bills goes to bank), collects and keeps track of income      Mobility   Mobility Status  Independent      Written Expression   Dominant Hand  Left    Handwriting  --   denies change     Vision - History   Baseline Vision  Wears glasses only for reading    Additional Comments  pt reports smaller print more difficult since stroke and early am a little blurry.       Cognition   Overall Cognitive Status  Within Functional Limits for tasks assessed      Sensation   Additional Comments  Intact to light touch in hand but feels tingling on right hand finger 2-5. Feet light touch intact      Coordination   9 Hole Peg Test   Right;Left    Right 9 Hole Peg Test  60.66 sec    Left 9 Hole Peg Test  21.40 sec      Edema   Edema  mild Rt hand      ROM / Strength   AROM / PROM / Strength  AROM;Strength      AROM   Overall AROM Comments  BUE AROM WNL's however RUE proximal weakness noted w/ hypermobile scapula - end range sh flexion pt comes out into abduction      Strength   Right/Left Shoulder  Right;Left    Right Shoulder Flexion  4+/5    Left Shoulder Flexion  5/5    Right Elbow Flexion  4+/5    Right Elbow Extension  4+/5    Left Elbow Flexion  5/5    Left Elbow Extension  5/5      Hand Function   Right Hand Grip (lbs)  35.9 lbs    Left Hand Grip (lbs)  60.4 lbs                        OT Short Term Goals - 05/02/19 1327      OT SHORT TERM GOAL #1   Title  Independent with RUE strengthening HEP    Time  4    Period  Weeks    Status  New      OT SHORT TERM GOAL #2   Title  Independent with LE dressing using A/E PRN    Time  4    Period  Weeks    Status  New      OT SHORT TERM GOAL #3   Title  Independent with coordination and putty HEP for Rt hand    Time  4    Period  Weeks    Status  New      OT SHORT TERM GOAL #4   Title  Pt to perform 10 min or more of dynamic standing w/o rest or LOB    Time  4    Period  Weeks    Status  New      OT SHORT TERM GOAL #5   Title  Improve  coordination Rt hand as evidenced by reducing speed on 9 hole peg test to 50 sec. or less    Baseline  60.66 sec    Time  4    Period  Weeks    Status  New      Additional Short Term Goals   Additional Short Term Goals  Yes      OT SHORT TERM GOAL #6   Title  Pt to cut food w/ A/E prn safely    Time  4    Period  Weeks    Status  New        OT Long Term Goals - 05/02/19 1329      OT LONG TERM GOAL #1   Title  Pt to perform full cooking tasks safely at mod I level w/ only 1 rest break    Time  8    Period  Weeks    Status  New      OT LONG TERM GOAL #2   Title  Pt to perform  heavier cleaning tasks safely at mod I level w/ rest breaks prn (taking out trash, vacuuming, etc)    Time  8    Period  Weeks    Status  New      OT LONG TERM GOAL #3   Title  Pt to improve coordination as evidenced by reducing speed on 9 hole peg test to 40 sec. or less Rt hand    Baseline  60.66 sec    Time  8    Period  Weeks    Status  New      OT LONG TERM GOAL #4   Title  Grip strength Rt hand to be 50 lbs or greater    Baseline  35.9 lbs    Time  8    Period  Weeks    Status  New      OT LONG TERM GOAL #5   Title  Pt to demo sufficient strength RUE to retrieve/replace 5 lb object from high shelf w/o pain and min compensations    Time  8    Period  Weeks    Status  New      Long Term Additional Goals   Additional Long Term Goals  Yes      OT LONG TERM GOAL #6   Title  Pt to perform environmental scanning at 90% accuracy w/ simple physical task    Time  8    Period  Weeks    Status  New            Plan - 05/02/19 1323    Clinical Impression Statement  Pt is a 64 y.o. male who presents to OPOT s/p lacunar infarct 04/12/19 w/ residual weakness Rt non dominant side. Pt presents specifically w/ decreased strength Rt shoulder, decreased grip strength, decreased coordination, and decreased mobility. Pt would benefit from OT to address these deficits and increase ease and safety with ADLS.    OT Occupational Profile and History  Problem Focused Assessment - Including review of records relating to presenting problem    Occupational performance deficits (Please refer to evaluation for details):  ADL's;IADL's;Work;Leisure    Body Structure / Function / Physical Skills  ADL;IADL;Balance;Body mechanics;Sensation;Mobility;Strength;Coordination;FMC;UE functional use;Decreased knowledge of use of DME    Rehab Potential  Good    Clinical Decision Making  Limited treatment options, no task modification necessary    Comorbidities Affecting Occupational Performance:  Presence of  comorbidities impacting  occupational performance    Comorbidities impacting occupational performance description:  obesity    Modification or Assistance to Complete Evaluation   No modification of tasks or assist necessary to complete eval    OT Frequency  2x / week    OT Duration  8 weeks   plus eval   OT Treatment/Interventions  Self-care/ADL training;Therapeutic exercise;Functional Mobility Training;Aquatic Therapy;Neuromuscular education;Manual Therapy;Therapeutic activities;Coping strategies training;DME and/or AE instruction;Passive range of motion;Patient/family education    Plan  practice donning/doffing shoes and socks w/ AE prn, issue coordination and putty HEP for Rt hand (following session: work on proximal shoulder girdle strength)    Consulted and Agree with Plan of Care  Patient;Family member/caregiver   girlfriend      Patient will benefit from skilled therapeutic intervention in order to improve the following deficits and impairments:   Body Structure / Function / Physical Skills: ADL, IADL, Balance, Body mechanics, Sensation, Mobility, Strength, Coordination, FMC, UE functional use, Decreased knowledge of use of DME       Visit Diagnosis: Hemiplegia and hemiparesis following cerebral infarction affecting right non-dominant side (HCC)  Other lack of coordination  Muscle weakness (generalized)  Unsteadiness on feet  Other disturbances of skin sensation    Problem List Patient Active Problem List   Diagnosis Date Noted  . DM (diabetes mellitus), type 2 with peripheral vascular complications ([place]) 52/84/1324  . Lacunar stroke ([place]) 04/12/2019    Carey Bullocks, OTR/L 05/02/2019, 1:36 PM  [facility] [address] [place], [place], [address] Phone: [phone]   Fax:  [phone]  Name: Benjamin Rosales. MRN: [id number] Date of Birth: 1955-09-06

## 2019-05-03 NOTE — Therapy (Signed)
[facility] [address] [place], [place], [address] Phone: [phone]   Fax:  [phone]  Physical Therapy Evaluation  Patient Details  Name: Maximos Zayas. MRN: [id number] Date of Birth: 01-25-1956 Referring Provider (PT): Referred by Suzi Roots but will be following up with Dr. Marjory Lies   Encounter Date: 05/02/2019  PT End of Session - 05/02/19 1201    Visit Number  1    Number of Visits  17    Date for PT Re-Evaluation  07/01/19    Authorization Type  FOTO,   BC[employer] 20 PT visits but additional visits can be requested by patient through their nurse care coordinator    PT Start Time  1153    PT Stop Time  1230    PT Time Calculation (min)  37 min    Activity Tolerance  Patient tolerated treatment well    Behavior During Therapy  [facility] for tasks assessed/performed       Past Medical History:  Diagnosis Date  . Hypertension     Past Surgical History:  Procedure Laterality Date  . TONSILLECTOMY      There were no vitals filed for this visit.   Subjective Assessment - 05/02/19 1200    Subjective  Pt presents after acute lacunar infarct. Pt reports that he has weakness on right side. Finds walking to be more tiring now. His stamina is a big issue. Pt having a little trouble with slurring speech but reports much improved. Seems to have trouble getting the words out at times.    Pertinent History  Symptoms started 3 days prior to going to hospital on 04/12/19 with right side weakness, slurred speech. Found to have acute lacunar infarct. Likely new onset DM.PMH: morbid obesity, HTN, alcholol abuse, hyperlipidemia    Patient Stated Goals  Pt would like to improve strength on right side and improve stamina.    Currently in Pain?  Yes    Pain Score  0-No pain   up to 8/10 at worst   Pain Location  Knee    Pain Orientation  Right;Medial    Pain Descriptors / Indicators  Sharp    Pain Type  Chronic pain     Pain Onset  1 to 4 weeks ago    Pain Frequency  Intermittent    Aggravating Factors   worse with bending in weight bearing.         [facility] PT Assessment - 05/02/19 1205      Assessment   Medical Diagnosis  lacunar infarct    Referring Provider (PT)  Referred by Suzi Roots but will be following up with Dr. Marjory Lies    Onset Date/Surgical Date  04/12/19    Hand Dominance  Left    Next MD Visit  05/20/19 Dr. Marjory Lies    Prior Therapy  a little in hospital      Precautions   Precaution Comments  newly diabetic and just got glucometer and started on metformin      Balance Screen   Has the patient fallen in the past 6 months  No    Has the patient had a decrease in activity level because of a fear of falling?   Yes    Is the patient reluctant to leave their home because of a fear of falling?   Yes      Home Environment   Living Environment  Private residence    Living Arrangements  Alone    Available  Help at Discharge  Friend(s)   girlfriend   Type of Home  Mobile home    Home Access  Stairs to enter    Entrance Stairs-Number of Steps  5    Entrance Stairs-Rails  None    Home Layout  One level    Home Equipment  None    Additional Comments  Girlfriend is in and out and helps coordinate all medical stuff      Prior Function   Level of Independence  Independent    Vocation  Full time employment    Print production planner at [employer], golf      Cognition   Overall Cognitive Status  Within Functional Limits for tasks assessed      Sensation   Light Touch  Impaired by gross assessment    Additional Comments  Intact to light touch in hand but feels tingling on right hand finger 2-5. Feet light touch intact      Coordination   Gross Motor Movements are Fluid and Coordinated  No   slowed right RAMs   Fine Motor Movements are Fluid and Coordinated  --   slowed right finger oppposition     ROM / Strength   AROM / PROM / Strength  Strength       Strength   Strength Assessment Site  Shoulder;Elbow;Hand;Hip;Knee;Ankle    Right/Left Shoulder  Right;Left    Right Shoulder Flexion  4+/5    Left Shoulder Flexion  5/5    Right/Left Elbow  Right;Left    Right Elbow Flexion  4+/5    Right Elbow Extension  4+/5    Left Elbow Flexion  5/5    Left Elbow Extension  5/5    Right/Left hand  Right;Left    Right Hand Gross Grasp  Functional   slightly less   Left Hand Gross Grasp  Functional    Right/Left Hip  Right;Left    Right Hip Flexion  4+/5    Left Hip Flexion  5/5    Right/Left Knee  Right;Left    Right Knee Flexion  4+/5    Right Knee Extension  4+/5    Left Knee Flexion  5/5    Left Knee Extension  5/5    Right/Left Ankle  Right;Left    Right Ankle Dorsiflexion  4+/5    Left Ankle Dorsiflexion  5/5      Transfers   Transfers  Sit to Stand;Stand to Sit    Sit to Stand  5: Supervision    Stand to Sit  5: Supervision    Comments  30 sec sit to stand=11 reps      Ambulation/Gait   Ambulation/Gait  Yes    Ambulation/Gait Assistance  5: Supervision    Ambulation Distance (Feet)  75 Feet    Assistive device  None    Gait Pattern  Step-through pattern;Decreased stance time - right;Decreased step length - left;Trendelenburg;Wide base of support;Decreased hip/knee flexion - right    Ambulation Surface  Level;Indoor    Gait velocity  17.76 sec=0.13m/s    Stairs  Yes    Stairs Assistance  5: Supervision    Stair Management Technique  One rail Right;Step to pattern;Sideways    Number of Stairs  4    Gait Comments  BP=150/70 after walking      Standardized Balance Assessment   Standardized Balance Assessment  Timed Up and Go Test      Timed Up and Go  Test   TUG  Normal TUG    Normal TUG (seconds)  18.88                Objective measurements completed on examination: See above findings.              PT Education - 05/02/19 2132    Education Details  Instructed to perform sit to stanc 5x 2 twice a  day. PT plan of care.    Person(s) Educated  Patient;Other (comment)   girlfriend   Methods  Explanation;Demonstration    Comprehension  Verbalized understanding;Returned demonstration       PT Short Term Goals - 05/03/19 0709      PT SHORT TERM GOAL #1   Title  Pt will be independent with initial HEP for strengthening and balance to continue gains on own.    Time  4    Period  Weeks    Status  New    Target Date  06/02/19      PT SHORT TERM GOAL #2   Title  Pt's balance/gait will be further assessed with FGA testing and LTG written.    Time  4    Period  Weeks    Status  New    Target Date  06/02/19      PT SHORT TERM GOAL #3   Title  6 min walk test will be performed to further assess gait and activity tolerance to get baseline for LTG.    Time  4    Period  Weeks    Status  New    Target Date  06/02/19      PT SHORT TERM GOAL #4   Title  Pt will increase 30 sec sit to stand from 11 reps to 13 or more reps from standard chair for improved functional strength.    Baseline  11 reps on 05/02/19    Time  4    Period  Weeks    Status  New    Target Date  06/02/19      PT SHORT TERM GOAL #5   Title  Pt will report <6/10 pain in right knee with activities for improved function.    Time  4    Period  Weeks    Status  New    Target Date  06/02/19        PT Long Term Goals - 05/03/19 2725      PT LONG TERM GOAL #1   Title  Pt will be independent with progressive HEP for strength, balance and functional mobility to continue gains on own.    Time  8    Period  Weeks    Status  New    Target Date  07/02/19      PT LONG TERM GOAL #2   Title  Pt will increase gait speed from 0.32m/s to >0.53m/s for improved gait safety in community.    Baseline  0.59m/s on 05/02/19    Time  8    Period  Weeks    Status  New    Target Date  07/02/19      PT LONG TERM GOAL #3   Title  Pt will decrease TUG from 18.88 sec to <15 sec for improved balance and functional strength.     Baseline  18.88sec on 05/02/19    Time  8    Period  Weeks    Status  New    Target Date  07/02/19  PT LONG TERM GOAL #4   Title  Pt will ambulate >500' on varied surfaces independently for safe community distances.    Time  8    Period  Weeks    Status  New    Target Date  07/02/19      PT LONG TERM GOAL #5   Title  Pt will increase 6 min walk distance by 200' for improved gait and activity tolerance.    Time  8    Period  Weeks    Status  New    Target Date  07/02/19      Additional Long Term Goals   Additional Long Term Goals  Yes      PT LONG TERM GOAL #6   Title  Pt will ambulate up/down 4 steps without railing independently to safely enter/exit home.    Time  8    Period  Weeks    Status  New    Target Date  07/02/19             Plan - 05/02/19 2134    Clinical Impression Statement  Pt is 64 y/o male with lacunar infarct who presents with mild right hemiparesis. Pt reports biggest issues is decrease in activity tolerance. Pt has developed right knee pain since stroke in weight bearing when bends. Pt has decreased functional strength as evident by 30 sec sit to stand of 11 reps. Impaired gait quality with gait speed of 0.72m/s showing safe household ambulator but not community. Pt is fall risk based on TUG score of 18.88 sec. Pt will benefit from skilled PT to address these deficits.    Personal Factors and Comorbidities  Comorbidity 3+    Comorbidities  morbid obesity, HTN, alcholol abuse, hyperlipidemia    Examination-Activity Limitations  Locomotion Level;Stand;Stairs    Examination-Participation Restrictions  Yard Work;Community Activity   job   Stability/Clinical Decision Making  Evolving/Moderate complexity    Clinical Decision Making  Moderate    Rehab Potential  Good    PT Frequency  2x / week    PT Duration  8 weeks    PT Treatment/Interventions  ADLs/Self Care Home Management;Cryotherapy;Moist Heat;Therapeutic exercise;Therapeutic  activities;Functional mobility training;Stair training;Gait training;DME Instruction;Balance training;Neuromuscular re-education;Patient/family education;Manual techniques;Passive range of motion;Energy conservation    PT Next Visit Plan  Perform additional balance testing: FGA, assess 6 min walk. Progress strengthening HEP. I started on sit to stand 5 x 2 twice a day.    Consulted and Agree with Plan of Care  Patient;Other (Comment)   girlfriend      Patient will benefit from skilled therapeutic intervention in order to improve the following deficits and impairments:  Abnormal gait, Cardiopulmonary status limiting activity, Decreased activity tolerance, Decreased balance, Decreased endurance, Decreased coordination, Decreased strength, Pain  Visit Diagnosis: Other abnormalities of gait and mobility  Muscle weakness (generalized)     Problem List Patient Active Problem List   Diagnosis Date Noted  . DM (diabetes mellitus), type 2 with peripheral vascular complications (HCC) 04/24/2019  . Lacunar stroke (HCC) 04/12/2019    Ronn Melena, PT, DPT, NCS 05/03/2019, 7:20 AM  [facility] [address] [place], [place], [address] Phone: [phone]   Fax:  [phone]  Name: Orvell Careaga. MRN: [id number] Date of Birth: Nov 10, 1955

## 2019-05-08 ENCOUNTER — Ambulatory Visit: Payer: BC Managed Care – PPO | Admitting: Occupational Therapy

## 2019-05-08 ENCOUNTER — Other Ambulatory Visit: Payer: Self-pay

## 2019-05-08 ENCOUNTER — Encounter: Payer: Self-pay | Admitting: Occupational Therapy

## 2019-05-08 ENCOUNTER — Encounter: Payer: Self-pay | Admitting: Physical Therapy

## 2019-05-08 ENCOUNTER — Ambulatory Visit: Payer: BC Managed Care – PPO | Admitting: Physical Therapy

## 2019-05-08 DIAGNOSIS — R208 Other disturbances of skin sensation: Secondary | ICD-10-CM

## 2019-05-08 DIAGNOSIS — M6281 Muscle weakness (generalized): Secondary | ICD-10-CM

## 2019-05-08 DIAGNOSIS — R2689 Other abnormalities of gait and mobility: Secondary | ICD-10-CM | POA: Diagnosis not present

## 2019-05-08 DIAGNOSIS — R2681 Unsteadiness on feet: Secondary | ICD-10-CM

## 2019-05-08 DIAGNOSIS — R278 Other lack of coordination: Secondary | ICD-10-CM

## 2019-05-08 DIAGNOSIS — I69353 Hemiplegia and hemiparesis following cerebral infarction affecting right non-dominant side: Secondary | ICD-10-CM

## 2019-05-08 NOTE — Patient Instructions (Signed)
1. Grip Strengthening (Resistive Putty)  Use the GREEN for grip. Make sure you do long hard squeezes (count to 4) and then make sure you reform it into a fat hot dog between each one. It is ok if you need to rest while doing it.  Do one session in the morning and one session in the evening to make sure your hand isn't too fatigued.    Squeeze putty using thumb and all fingers. Repeat _20___ times. Do __2__ sessions per day.   2. Roll RED putty into tube on table and pinch along the tube. When you get to the end fold it in half and reform it.  Do 5 times.  Do 2 sessions.  Its ok to rest if you need to while you are doing. Do one session in the morning and one session at night to avoid your hand becoming too fatigued.    Coordination Activities  Perform the following activities for 15-20  minutes 1-2  times per day with right hand(s). Make sure you rest when you need to- we don't want to reinforce clumsy movement!!   Rotate ball in fingertips (clockwise and counter-clockwise).Take your time with this - don't rush it. This is about accuracy NOT speed.    Toss ball between hands.  Toss ball in air and catch with the same hand.  Flip cards 1 at a time - at first just get a good rhythm and focus on accuracy. As it gets easier you can focus on increasing your speed.   Pick up coins and place in container or coin bank. Focus on accuracy not speed. The speed will come as the hand gets more coordinated.   Pick up coins and make stacks of 3-4.Marland Kitchen  Pick up coins one at a time until you get 3 in your hand, then move coins from palm to fingertips to stack one at a time. For now just do a few of these as they are pretty difficult.   Screw together nuts and bolts, then unfasten. Make sure your left hand holds the nut, and the right hand does the turning!!! Use a nut and bolt of medium size like we did in the clinic.  When this gets easier you can switch to a smaller on.e      Copyright  VHI. All  rights reserved.

## 2019-05-08 NOTE — Patient Instructions (Signed)
Access Code: [email or web address]  URL: [email or web address]  Date: 05/08/2019  Prepared by: Sallyanne Kuster   Exercises Sit to/from Stand in Stride position - 5 reps - 2 sets - 1x daily - 5x weekly Side Stepping with Resistance at Thighs and Counter Support - 3 reps - 1 sets - 1x daily - 5x weekly Standing Single Leg Stance with Counter Support - 3 reps - 1 sets - 10 hold - 1x daily - 5x weekly

## 2019-05-08 NOTE — Therapy (Signed)
[facility] [address] [place], [place], [address] Phone: [phone]   Fax:  [phone]  Occupational Therapy Treatment  Patient Details  Name: Benjamin Rosales. MRN: [id number] Date of Birth: 1955-09-15 No data recorded  Encounter Date: 05/08/2019  OT End of Session - 05/08/19 1202    Visit Number  2    Number of Visits  17    Date for OT Re-Evaluation  07/02/19    Authorization Type  BC/BS [employer] - 20 VL each discipline    Authorization - Visit Number  2    Authorization - Number of Visits  20    OT Start Time  0801    OT Stop Time  0844    OT Time Calculation (min)  43 min    Activity Tolerance  Patient tolerated treatment well       Past Medical History:  Diagnosis Date  . Hypertension     Past Surgical History:  Procedure Laterality Date  . TONSILLECTOMY      There were no vitals filed for this visit.  Subjective Assessment - 05/08/19 0803    Subjective   I don't need a copy of my goals but I think they are fine.    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 PMH: obesity, T2DM, HTN, HLD, ETOH abuse, chronic small vessel ischemic dz    Limitations  driving    Currently in Pain?  No/denies                   OT Treatments/Exercises (OP) - 05/08/19 0001      ADLs   ADL Comments  Reviewed goals and POC - pt in agreement with goals and POC.  Pt declined written copy of goals.       Neurological Re-education Exercises   Other Exercises 1  Instructed pt in HEP for grip strength (using green putty) and pinch strength (using pink putty) as well as HEP for coordination. After instruction and practice pt able to return demonstrate all activities.              OT Education - 05/08/19 1201    Education Details  OT goals and POC, putty and coordination HEP    Person(s) Educated  Patient    Methods  Explanation;Demonstration;Handout    Comprehension   Verbalized understanding;Returned demonstration       OT Short Term Goals - 05/08/19 1201      OT SHORT TERM GOAL #1   Title  Independent with RUE strengthening HEP    Time  4    Period  Weeks    Status  On-going      OT SHORT TERM GOAL #2   Title  Independent with LE dressing using A/E PRN    Time  4    Period  Weeks    Status  On-going      OT SHORT TERM GOAL #3   Title  Independent with coordination and putty HEP for Rt hand    Time  4    Period  Weeks    Status  Achieved      OT SHORT TERM GOAL #4   Title  Pt to perform 10 min or more of dynamic standing w/o rest or LOB    Time  4    Period  Weeks    Status  On-going      OT SHORT TERM GOAL #5   Title  Improve  coordination Rt hand as evidenced by reducing speed on 9 hole peg test to 50 sec. or less    Baseline  60.66 sec    Time  4    Period  Weeks    Status  New      OT SHORT TERM GOAL #6   Title  Pt to cut food w/ A/E prn safely    Time  4    Period  Weeks    Status  On-going        OT Long Term Goals - 05/02/19 1329      OT LONG TERM GOAL #1   Title  Pt to perform full cooking tasks safely at mod I level w/ only 1 rest break    Time  8    Period  Weeks    Status  New      OT LONG TERM GOAL #2   Title  Pt to perform heavier cleaning tasks safely at mod I level w/ rest breaks prn (taking out trash, vacuuming, etc)    Time  8    Period  Weeks    Status  New      OT LONG TERM GOAL #3   Title  Pt to improve coordination as evidenced by reducing speed on 9 hole peg test to 40 sec. or less Rt hand    Baseline  60.66 sec    Time  8    Period  Weeks    Status  New      OT LONG TERM GOAL #4   Title  Grip strength Rt hand to be 50 lbs or greater    Baseline  35.9 lbs    Time  8    Period  Weeks    Status  New      OT LONG TERM GOAL #5   Title  Pt to demo sufficient strength RUE to retrieve/replace 5 lb object from high shelf w/o pain and min compensations    Time  8    Period  Weeks    Status   New      Long Term Additional Goals   Additional Long Term Goals  Yes      OT LONG TERM GOAL #6   Title  Pt to perform environmental scanning at 90% accuracy w/ simple physical task    Time  8    Period  Weeks    Status  New            Plan - 05/08/19 1201    Clinical Impression Statement  Pt in agreement with OT goals and POC. Pt progressing toward goals.    OT Occupational Profile and History  Problem Focused Assessment - Including review of records relating to presenting problem    Occupational performance deficits (Please refer to evaluation for details):  ADL's;IADL's;Work;Leisure    Body Structure / Function / Physical Skills  ADL;IADL;Balance;Body mechanics;Sensation;Mobility;Strength;Coordination;FMC;UE functional use;Decreased knowledge of use of DME    Rehab Potential  Good    Clinical Decision Making  Limited treatment options, no task modification necessary    Comorbidities Affecting Occupational Performance:  Presence of comorbidities impacting occupational performance    Comorbidities impacting occupational performance description:  obesity    Modification or Assistance to Complete Evaluation   No modification of tasks or assist necessary to complete eval    OT Frequency  2x / week    OT Duration  8 weeks    OT Treatment/Interventions  Self-care/ADL training;Therapeutic exercise;Functional  Mobility Training;Aquatic Therapy;Neuromuscular education;Manual Therapy;Therapeutic activities;Coping strategies training;DME and/or AE instruction;Passive range of motion;Patient/family education    Plan  practice donning/doffing shoes and socks w/ AE, work on proximal shoulder girdle strength    Consulted and Agree with Plan of Care  Patient       Patient will benefit from skilled therapeutic intervention in order to improve the following deficits and impairments:   Body Structure / Function / Physical Skills: ADL, IADL, Balance, Body mechanics, Sensation, Mobility, Strength,  Coordination, FMC, UE functional use, Decreased knowledge of use of DME       Visit Diagnosis: Hemiplegia and hemiparesis following cerebral infarction affecting right non-dominant side (HCC)  Other lack of coordination  Muscle weakness (generalized)  Unsteadiness on feet  Other disturbances of skin sensation    Problem List Patient Active Problem List   Diagnosis Date Noted  . DM (diabetes mellitus), type 2 with peripheral vascular complications ([place]) 16/11/9602  . Lacunar stroke ([place]) 04/12/2019    Quay Burow, OTR/L 05/08/2019, 12:04 PM  [facility] [address] [place], [place], [address] Phone: [phone]   Fax:  [phone]  Name: Benjamin Rosales. MRN: [id number] Date of Birth: 02-Sep-1955

## 2019-05-09 ENCOUNTER — Telehealth: Payer: Self-pay | Admitting: Family Medicine

## 2019-05-09 NOTE — Therapy (Signed)
[facility] [address] [place], [place], [address] Phone: [phone]   Fax:  [phone]  Physical Therapy Treatment  Patient Details  Name: Benjamin Rosales. MRN: [id number] Date of Birth: 08/08/1955 Referring Provider (PT): Referred by Pecola Leisure but will be following up with Dr. Leta Baptist   Encounter Date: 05/08/2019  PT End of Session - 05/08/19 0719    Visit Number  2    Number of Visits  17    Date for PT Re-Evaluation  07/01/19    Authorization Type  FOTO,   [employer] 20 PT visits but additional visits can be requested by patient through their nurse care coordinator    PT Start Time  0715    PT Stop Time  0800    PT Time Calculation (min)  45 min    Activity Tolerance  Patient tolerated treatment well    Behavior During Therapy  [facility] for tasks assessed/performed       Past Medical History:  Diagnosis Date  . Hypertension     Past Surgical History:  Procedure Laterality Date  . TONSILLECTOMY      There were no vitals filed for this visit.  Subjective Assessment - 05/08/19 0719    Subjective  No new complaints. No falls. A little "twinge" in the right knee, otherwise no pain.    Pertinent History  Symptoms started 3 days prior to going to hospital on 04/12/19 with right side weakness, slurred speech. Found to have acute lacunar infarct. Likely new onset DM.PMH: morbid obesity, HTN, alcholol abuse, hyperlipidemia    Patient Stated Goals  Pt would like to improve strength on right side and improve stamina.    Currently in Pain?  No/denies    Pain Score  0-No pain         [facility] PT Assessment - 05/08/19 0721      6 Minute Walk- Baseline   6 Minute Walk- Baseline  yes    BP (mmHg)  (!) 130/94    HR (bpm)  87    02 Sat (%RA)  98 %    Modified Borg Scale for Dyspnea  0- Nothing at all    Perceived Rate of Exertion (Borg)  6-      6 Minute walk- Post Test   6 Minute Walk Post Test  yes    BP  (mmHg)  151/87    HR (bpm)  85    02 Sat (%RA)  99 %    Modified Borg Scale for Dyspnea  2- Mild shortness of breath    Perceived Rate of Exertion (Borg)  12-      6 minute walk test results    Aerobic Endurance Distance Walked  735    Endurance additional comments  no AD, 1 brief stop for <5 seconds to point where it was sore on left side/LE.       Functional Gait  Assessment   Gait assessed   Yes    Gait Level Surface  Walks 20 ft, slow speed, abnormal gait pattern, evidence for imbalance or deviates 10-15 in outside of the 12 in walkway width. Requires more than 7 sec to ambulate 20 ft.   >10 sec's   Change in Gait Speed  Able to change speed, demonstrates mild gait deviations, deviates 6-10 in outside of the 12 in walkway width, or no gait deviations, unable to achieve a major change in velocity, or uses a change in velocity, or uses an  assistive device.    Gait with Horizontal Head Turns  Performs head turns smoothly with slight change in gait velocity (eg, minor disruption to smooth gait path), deviates 6-10 in outside 12 in walkway width, or uses an assistive device.    Gait with Vertical Head Turns  Performs task with moderate change in gait velocity, slows down, deviates 10-15 in outside 12 in walkway width but recovers, can continue to walk.    Gait and Pivot Turn  Pivot turns safely in greater than 3 sec and stops with no loss of balance, or pivot turns safely within 3 sec and stops with mild imbalance, requires small steps to catch balance.    Step Over Obstacle  Is able to step over one shoe box (4.5 in total height) but must slow down and adjust steps to clear box safely. May require verbal cueing.    Gait with Narrow Base of Support  Ambulates less than 4 steps heel to toe or cannot perform without assistance.    Gait with Eyes Closed  Cannot walk 20 ft without assistance, severe gait deviations or imbalance, deviates greater than 15 in outside 12 in walkway width or will not  attempt task.    Ambulating Backwards  Walks 20 ft, slow speed, abnormal gait pattern, evidence for imbalance, deviates 10-15 in outside 12 in walkway width.    Steps  Two feet to a stair, must use rail.    Total Score  11    FGA comment:  11/30= high risk for falls      Treatment continued: educated pt on and issued HEP to address strengthening and balance. Cues on correct form/technique with min guard assist for balance.   Access Code: [email or web address]  URL: [email or web address]  Date: 05/08/2019  Prepared by: Willow Ora   Exercises Sit to/from Stand in Stride position - 5 reps - 2 sets - 1x daily - 5x weekly Side Stepping with Resistance at Thighs and Counter Support - 3 reps - 1 sets - 1x daily - 5x weekly Standing Single Leg Stance with Counter Support - 3 reps - 1 sets - 10 hold - 1x daily - 5x weekly        PT Education - 05/08/19 0758    Education Details  results of 6 minute walk test and Functional Gait Assesment. HEP for strengthening and balance.    Person(s) Educated  Patient    Methods  Explanation;Demonstration;Verbal cues;Handout    Comprehension  Verbalized understanding;Returned demonstration;Verbal cues required;Need further instruction       PT Short Term Goals - 05/08/19 0803      PT SHORT TERM GOAL #1   Title  Pt will be independent with initial HEP for strengthening and balance to continue gains on own.    Time  4    Period  Weeks    Status  On-going    Target Date  06/02/19      PT SHORT TERM GOAL #2   Title  Pt's balance/gait will be further assessed with FGA testing and LTG written.    Baseline  05/08/19: met in session today with score of 11/30. Primary PT to set goals.    Status  Achieved    Target Date  06/02/19      PT SHORT TERM GOAL #3   Title  6 min walk test will be performed to further assess gait and activity tolerance to get baseline for LTG.    Baseline  05/08/19: met in session today with  distance of 735 feet no AD, one  brief standing rest break. Primary PT to set goals.    Status  Achieved    Target Date  06/02/19      PT SHORT TERM GOAL #4   Title  Pt will increase 30 sec sit to stand from 11 reps to 13 or more reps from standard chair for improved functional strength.    Baseline  11 reps on 05/02/19    Time  4    Period  Weeks    Status  On-going    Target Date  06/02/19      PT SHORT TERM GOAL #5   Title  Pt will report <6/10 pain in right knee with activities for improved function.    Time  4    Period  Weeks    Status  On-going    Target Date  06/02/19        PT Long Term Goals - 05/03/19 0712      PT LONG TERM GOAL #1   Title  Pt will be independent with progressive HEP for strength, balance and functional mobility to continue gains on own.    Time  8    Period  Weeks    Status  New    Target Date  07/02/19      PT LONG TERM GOAL #2   Title  Pt will increase gait speed from 0.13ms to >0.786m for improved gait safety in community.    Baseline  0.5694mon 05/02/19    Time  8    Period  Weeks    Status  New    Target Date  07/02/19      PT LONG TERM GOAL #3   Title  Pt will decrease TUG from 18.88 sec to <15 sec for improved balance and functional strength.    Baseline  18.88sec on 05/02/19    Time  8    Period  Weeks    Status  New    Target Date  07/02/19      PT LONG TERM GOAL #4   Title  Pt will ambulate >500' on varied surfaces independently for safe community distances.    Time  8    Period  Weeks    Status  New    Target Date  07/02/19      PT LONG TERM GOAL #5   Title  Pt will increase 6 min walk distance by 200' for improved gait and activity tolerance.    Time  8    Period  Weeks    Status  New    Target Date  07/02/19      Additional Long Term Goals   Additional Long Term Goals  Yes      PT LONG TERM GOAL #6   Title  Pt will ambulate up/down 4 steps without railing independently to safely enter/exit home.    Time  8    Period  Weeks    Status  New     Target Date  07/02/19            Plan - 05/08/19 0720    Clinical Impression Statement  Today's skilled session initially focused on establishing baselines for 6 minute walk test and Funtional Gait Assessment. Pt scored 11/30 on FGA, placing him in high fall risk category. Primary PT to update goals based on results. Remainder of session addressed issuing ex's to HEP to address LE strengthening and balance. No issues reported  with session. The pt is progressing and should benefit from continued PT to progress toward unmet goals.    Personal Factors and Comorbidities  Comorbidity 3+    Comorbidities  morbid obesity, HTN, alcholol abuse, hyperlipidemia    Examination-Activity Limitations  Locomotion Level;Stand;Stairs    Examination-Participation Restrictions  Yard Work;Community Activity   job   Stability/Clinical Decision Making  Evolving/Moderate complexity    Rehab Potential  Good    PT Frequency  2x / week    PT Duration  8 weeks    PT Treatment/Interventions  ADLs/Self Care Home Management;Cryotherapy;Moist Heat;Therapeutic exercise;Therapeutic activities;Functional mobility training;Stair training;Gait training;DME Instruction;Balance training;Neuromuscular re-education;Patient/family education;Manual techniques;Passive range of motion;Energy conservation    PT Next Visit Plan  add to HEP for strengthening/balance as indicated; continue to address LE strengthening in session, balance reactions on floor progressing to on complaint surfaces    PT Home Exercise Plan  Access Code: [email or web address]    Consulted and Agree with Plan of Care  Patient;Other (Comment)   girlfriend      Patient will benefit from skilled therapeutic intervention in order to improve the following deficits and impairments:  Abnormal gait, Cardiopulmonary status limiting activity, Decreased activity tolerance, Decreased balance, Decreased endurance, Decreased coordination, Decreased strength, Pain  Visit  Diagnosis: Muscle weakness (generalized)  Unsteadiness on feet  Hemiplegia and hemiparesis following cerebral infarction affecting right non-dominant side [facility])     Problem List Patient Active Problem List   Diagnosis Date Noted  . DM (diabetes mellitus), type 2 with peripheral vascular complications ([place]) 62/70/3500  . Lacunar stroke ([place]) 04/12/2019    Willow Ora, PTA, [facility] [address] [place], [place] [address] [phone] 05/09/19, 8:09 AM   Name: Benjamin Rosales. MRN: [id number] Date of Birth: Dec 30, 1955

## 2019-05-09 NOTE — Telephone Encounter (Signed)
Pt spouse is requesting medical notes from pt last visit sent to short term disability.   [facility]  Phone: [phone] Fax: [phone] Attn: Claim #[id number]

## 2019-05-10 NOTE — Telephone Encounter (Signed)
OV notes printed & faxed to info provided by pt's spouse.

## 2019-05-11 ENCOUNTER — Other Ambulatory Visit: Payer: Self-pay | Admitting: Family Medicine

## 2019-05-14 ENCOUNTER — Telehealth: Payer: Self-pay | Admitting: Family Medicine

## 2019-05-14 MED ORDER — ONETOUCH VERIO VI STRP: ORAL_STRIP | 12 refills | Status: AC

## 2019-05-14 NOTE — Telephone Encounter (Signed)
Rx sent in as requested. 

## 2019-05-14 NOTE — Telephone Encounter (Signed)
Medication Refill: One-touch Verio flex touch strips  Pharmacy: [facility] [address] FAX: [phone]

## 2019-05-15 ENCOUNTER — Other Ambulatory Visit: Payer: Self-pay

## 2019-05-15 ENCOUNTER — Ambulatory Visit: Payer: BC Managed Care – PPO | Admitting: Physical Therapy

## 2019-05-15 ENCOUNTER — Encounter: Payer: Self-pay | Admitting: Physical Therapy

## 2019-05-15 ENCOUNTER — Ambulatory Visit: Payer: BC Managed Care – PPO | Admitting: Occupational Therapy

## 2019-05-15 DIAGNOSIS — R2689 Other abnormalities of gait and mobility: Secondary | ICD-10-CM

## 2019-05-15 DIAGNOSIS — M6281 Muscle weakness (generalized): Secondary | ICD-10-CM

## 2019-05-15 DIAGNOSIS — I69353 Hemiplegia and hemiparesis following cerebral infarction affecting right non-dominant side: Secondary | ICD-10-CM

## 2019-05-15 NOTE — Patient Instructions (Signed)
Access Code: [email or web address] URL: [email or web address] Date: 05/15/2019 Prepared by: Sallyanne Kuster  Exercises Sit to/from Stand in Stride position - 1 x daily - 5 x weekly - 5 reps - 2 sets Side Stepping with Resistance at Thighs and Counter Support - 1 x daily - 5 x weekly - 3 reps - 1 sets Standing Single Leg Stance with Counter Support - 1 x daily - 5 x weekly - 3 reps - 1 sets - 10 hold Romberg Stance Eyes Closed on Foam Pad - 1 x daily - 5 x weekly - 1 sets - 3 reps - 30 hold Wide Stance with Eyes Closed and Head Rotation on Foam Pad - 1 x daily - 5 x weekly - 1 sets - 10 reps Wide Tandem Stance on Foam Pad with Eyes Closed - 1 x daily - 5 x weekly - 1 sets - 2-3 reps - 20 hold

## 2019-05-15 NOTE — Patient Instructions (Signed)
Cranial Flexion: Overhead Arm Extension - Supine ([facility])    Lie with knees bent, arms beyond head, holding __2__ lb dumb bell, shoe box or paper towel roll (palms facing). Pull ball up to above face. Repeat _10___ times per set. Do __1__ sets per session. Do _2___ sessions per day.  Circle (Supine)    Lie on back, holding _1-2_ dumb belll above chest in right hand at eye level. Move arm in small circles clockwise, then counterclockwise, arm straight, THUMB SIDE UP Repeat 10__ times.  Do _1_ sets per session. Do 2 sessions per day  STANDING: Wall Push Up    With arms slightly wider than shoulders, gently lean body to wall. Push body away from wall by straightening elbows. Keep shoulders down, keep weight even through both arms.  _10__ reps per set, _2__ sets per day

## 2019-05-15 NOTE — Therapy (Signed)
[facility] [address] [place], [place], [address] Phone: [phone]   Fax:  [phone]  Physical Therapy Treatment  Patient Details  Name: Benjamin Rosales. MRN: [id number] Date of Birth: [birth year] Referring Provider (PT): Referred by Pecola Leisure but will be following up with Dr. Leta Baptist   Encounter Date: 05/15/2019  PT End of Session - 05/15/19 0936    Visit Number  3    Number of Visits  17    Date for PT Re-Evaluation  07/01/19    Authorization Type  FOTO,   BCBS 20 PT visits but additional visits can be requested by patient through their nurse care coordinator    PT Start Time  [phone]    PT Stop Time  1013    PT Time Calculation (min)  40 min    Equipment Utilized During Treatment  Gait belt    Activity Tolerance  Patient tolerated treatment well    Behavior During Therapy  WFL for tasks assessed/performed       Past Medical History:  Diagnosis Date  . Hypertension     Past Surgical History:  Procedure Laterality Date  . TONSILLECTOMY      There were no vitals filed for this visit.  Subjective Assessment - 05/15/19 0936    Subjective  No new complaitns. No falls. Still with a little "twinge" in the right knee.    Pertinent History  Symptoms started 3 days prior to going to hospital on 04/12/19 with right side weakness, slurred speech. Found to have acute lacunar infarct. Likely new onset DM.PMH: morbid obesity, HTN, alcholol abuse, hyperlipidemia    Patient Stated Goals  Pt would like to improve strength on right side and improve stamina.    Currently in Pain?  No/denies    Pain Score  0-No pain           [facility] Adult PT Treatment/Exercise - 05/15/19 0937      Transfers   Transfers  Sit to Stand;Stand to Sit    Sit to Stand  5: Supervision;With upper extremity assist;From chair/3-in-1    Stand to Sit  5: Supervision;With upper extremity assist;To chair/3-in-1      Ambulation/Gait    Ambulation/Gait  Yes    Ambulation/Gait Assistance  5: Supervision    Ambulation/Gait Assistance Details  around gym with session. pt has a slow cadendce with wide base of support.     Assistive device  None    Gait Pattern  Step-through pattern;Decreased stance time - right;Decreased step length - left;Trendelenburg;Wide base of support;Decreased hip/knee flexion - right    Ambulation Surface  Level;Indoor      Neuro Re-ed    Neuro Re-ed Details   added balance ex's to HEP. Refer to [place] [facility] program for full details.       Knee/Hip Exercises: Aerobic   Other Aerobic  Scifit LE's, some UE's level 2.0 for 8 minutes with goal rpm 40-45 for strengthening and activity tolerance      Access Code: [email or web address] URL: [email or web address] Date: 05/15/2019 Prepared by: Willow Ora  Exercises Sit to/from Stand in Stride position - 1 x daily - 5 x weekly - 5 reps - 2 sets Side Stepping with Resistance at Thighs and Counter Support - 1 x daily - 5 x weekly - 3 reps - 1 sets Standing Single Leg Stance with Counter Support - 1 x daily - 5 x weekly - 3 reps - 1 sets - 10 hold  Added the  following to HEP today. Min guard assist for balance with cues on form/technique. Romberg Stance Eyes Closed on Foam Pad - 1 x daily - 5 x weekly - 1 sets - 3 reps - 30 hold Wide Stance with Eyes Closed and Head Rotation on Foam Pad - 1 x daily - 5 x weekly - 1 sets - 10 reps Wide Tandem Stance on Foam Pad with Eyes Closed - 1 x daily - 5 x weekly - 1 sets - 2-3 reps - 20 hold       PT Education - 05/15/19 1426    Education Details  balance ex's added to HEP in [employer]) Educated  Patient    Methods  Explanation;Demonstration;Verbal cues;Handout    Comprehension  Verbalized understanding;Returned demonstration;Verbal cues required;Need further instruction       PT Short Term Goals - 05/08/19 0803      PT SHORT TERM GOAL #1   Title  Pt will be independent with initial HEP for  strengthening and balance to continue gains on own.    Time  4    Period  Weeks    Status  On-going    Target Date  06/02/19      PT SHORT TERM GOAL #2   Title  Pt's balance/gait will be further assessed with FGA testing and LTG written.    Baseline  05/08/19: met in session today with score of 11/30. Primary PT to set goals.    Status  Achieved    Target Date  06/02/19      PT SHORT TERM GOAL #3   Title  6 min walk test will be performed to further assess gait and activity tolerance to get baseline for LTG.    Baseline  05/08/19: met in session today with distance of 735 feet no AD, one brief standing rest break. Primary PT to set goals.    Status  Achieved    Target Date  06/02/19      PT SHORT TERM GOAL #4   Title  Pt will increase 30 sec sit to stand from 11 reps to 13 or more reps from standard chair for improved functional strength.    Baseline  11 reps on 05/02/19    Time  4    Period  Weeks    Status  On-going    Target Date  06/02/19      PT SHORT TERM GOAL #5   Title  Pt will report <6/10 pain in right knee with activities for improved function.    Time  4    Period  Weeks    Status  On-going    Target Date  06/02/19        PT Long Term Goals - 05/03/19 0712      PT LONG TERM GOAL #1   Title  Pt will be independent with progressive HEP for strength, balance and functional mobility to continue gains on own.    Time  8    Period  Weeks    Status  New    Target Date  07/02/19      PT LONG TERM GOAL #2   Title  Pt will increase gait speed from 0.58ms to >0.759m for improved gait safety in community.    Baseline  0.5673mon 05/02/19    Time  8    Period  Weeks    Status  New    Target Date  07/02/19      PT  LONG TERM GOAL #3   Title  Pt will decrease TUG from 18.88 sec to <15 sec for improved balance and functional strength.    Baseline  18.88sec on 05/02/19    Time  8    Period  Weeks    Status  New    Target Date  07/02/19      PT LONG TERM GOAL #4    Title  Pt will ambulate >500' on varied surfaces independently for safe community distances.    Time  8    Period  Weeks    Status  New    Target Date  07/02/19      PT LONG TERM GOAL #5   Title  Pt will increase 6 min walk distance by 200' for improved gait and activity tolerance.    Time  8    Period  Weeks    Status  New    Target Date  07/02/19      Additional Long Term Goals   Additional Long Term Goals  Yes      PT LONG TERM GOAL #6   Title  Pt will ambulate up/down 4 steps without railing independently to safely enter/exit home.    Time  8    Period  Weeks    Status  New    Target Date  07/02/19            Plan - 05/15/19 4332    Clinical Impression Statement  Today's skilled session initially utilized Scifit to address strengthening and activity tolerance. Remainder of session focused on adding balance ex's to pt's HEP with rest breaks taken as needed. The pt is progressing toward goals and should benefit from continued PT to progress toward unmet goals.    Personal Factors and Comorbidities  Comorbidity 3+;Social Background    Comorbidities  morbid obesity, HTN, alcholol abuse, hyperlipidemia    Examination-Activity Limitations  Locomotion Level;Stand;Stairs    Examination-Participation Restrictions  Yard Work;Community Activity   job   Stability/Clinical Decision Making  Evolving/Moderate complexity    Rehab Potential  Good    PT Frequency  2x / week    PT Duration  8 weeks    PT Treatment/Interventions  ADLs/Self Care Home Management;Cryotherapy;Moist Heat;Therapeutic exercise;Therapeutic activities;Functional mobility training;Stair training;Gait training;DME Instruction;Balance training;Neuromuscular re-education;Patient/family education;Manual techniques;Passive range of motion;Energy conservation    PT Next Visit Plan  continue to address LE strengthening in session, balance reactions on floor progressing to on complaint surfaces    PT Home Exercise Plan   Access Code: [email or web address]    Consulted and Agree with Plan of Care  Patient;Other (Comment)   girlfriend      Patient will benefit from skilled therapeutic intervention in order to improve the following deficits and impairments:  Abnormal gait, Cardiopulmonary status limiting activity, Decreased activity tolerance, Decreased balance, Decreased endurance, Decreased coordination, Decreased strength, Pain  Visit Diagnosis: Other abnormalities of gait and mobility  Muscle weakness (generalized)     Problem List Patient Active Problem List   Diagnosis Date Noted  . DM (diabetes mellitus), type 2 with peripheral vascular complications ([place]) 60/63/0160  . Lacunar stroke ([place]) 04/12/2019    Willow Ora, PTA, [place] [address], [place],  [address] [phone] 05/15/19, 2:28 PM   Name: Lennard Capek. MRN: [id number] Date of Birth: [birth year]

## 2019-05-15 NOTE — Therapy (Signed)
[facility] [address] [place], [place], [address] Phone: [phone]   Fax:  [phone]  Occupational Therapy Treatment  Patient Details  Name: Benjamin Rosales. MRN: [id number] Date of Birth: 04-Mar-1955 No data recorded  Encounter Date: 05/15/2019  OT End of Session - 05/15/19 1057    Visit Number  3    Number of Visits  17    Date for OT Re-Evaluation  07/02/19    Authorization Type  BC/BS [employer] - 20 VL each discipline    Authorization - Visit Number  3    Authorization - Number of Visits  20    OT Start Time  1015    OT Stop Time  1100    OT Time Calculation (min)  45 min    Activity Tolerance  Patient tolerated treatment well    Behavior During Therapy  WFL for tasks assessed/performed       Past Medical History:  Diagnosis Date  . Hypertension     Past Surgical History:  Procedure Laterality Date  . TONSILLECTOMY      There were no vitals filed for this visit.  Subjective Assessment - 05/15/19 1016    Subjective   I have a little swelling in my Rt hand at times. Is this normal?    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 PMH: obesity, T2DM, HTN, HLD, ETOH abuse, chronic small vessel ischemic dz    Limitations  driving    Currently in Pain?  No/denies                   OT Treatments/Exercises (OP) - 05/15/19 1020      ADLs   LB Dressing  Pt encouraged to bring in socks and tie up shoes next session for practice    ADL Comments  Addressed edmea Rt hand (mild) and recommended using hand functionally and elevating when needed.       Exercises   Exercises  Shoulder      Shoulder Exercises: ROM/Strengthening   UBE (Upper Arm Bike)  X 8 min, level 1 for reciprocal movement pattern      Neurological Re-education Exercises   Other Exercises 1  Supine: BUE sh flexion w/ 2 lb weight (palms facing) for overhead sh flexion w/ cues for control and to prevent IR  and abd. Followed by sh circumduction ex's at 90* for control/sh girdle strengthening - issued both as part of HEP     Other Exercises 2  (Did not do any ex's in prone d/t pt's weight and large hernia)     Other Weight-Bearing Exercises 1  Quadraped: cat/cow stretch followed by A/P wt shifts w/ cues for Rt elbow extension - did not give this as part of HEP d/t difficulty getting up/down from floor at home and knee issues (but pt able to do on mat in clinic well)    Other Weight-Bearing Exercises 2  Wall push ups w/ initial cues to keep weight even b/t UE's and to keep shoulders down - after cues and reps,  pt performed correctly. Issued as part of HEP             OT Education - 05/15/19 1047    Education Details  RUE HEP    Person(s) Educated  Patient    Methods  Explanation;Demonstration;Handout    Comprehension  Verbalized understanding;Returned demonstration       OT Short Term Goals - 05/08/19 1201  OT SHORT TERM GOAL #1   Title  Independent with RUE strengthening HEP    Time  4    Period  Weeks    Status  On-going      OT SHORT TERM GOAL #2   Title  Independent with LE dressing using A/E PRN    Time  4    Period  Weeks    Status  On-going      OT SHORT TERM GOAL #3   Title  Independent with coordination and putty HEP for Rt hand    Time  4    Period  Weeks    Status  Achieved      OT SHORT TERM GOAL #4   Title  Pt to perform 10 min or more of dynamic standing w/o rest or LOB    Time  4    Period  Weeks    Status  On-going      OT SHORT TERM GOAL #5   Title  Improve coordination Rt hand as evidenced by reducing speed on 9 hole peg test to 50 sec. or less    Baseline  60.66 sec    Time  4    Period  Weeks    Status  New      OT SHORT TERM GOAL #6   Title  Pt to cut food w/ A/E prn safely    Time  4    Period  Weeks    Status  On-going        OT Long Term Goals - 05/02/19 1329      OT LONG TERM GOAL #1   Title  Pt to perform full cooking tasks  safely at mod I level w/ only 1 rest break    Time  8    Period  Weeks    Status  New      OT LONG TERM GOAL #2   Title  Pt to perform heavier cleaning tasks safely at mod I level w/ rest breaks prn (taking out trash, vacuuming, etc)    Time  8    Period  Weeks    Status  New      OT LONG TERM GOAL #3   Title  Pt to improve coordination as evidenced by reducing speed on 9 hole peg test to 40 sec. or less Rt hand    Baseline  60.66 sec    Time  8    Period  Weeks    Status  New      OT LONG TERM GOAL #4   Title  Grip strength Rt hand to be 50 lbs or greater    Baseline  35.9 lbs    Time  8    Period  Weeks    Status  New      OT LONG TERM GOAL #5   Title  Pt to demo sufficient strength RUE to retrieve/replace 5 lb object from high shelf w/o pain and min compensations    Time  8    Period  Weeks    Status  New      Long Term Additional Goals   Additional Long Term Goals  Yes      OT LONG TERM GOAL #6   Title  Pt to perform environmental scanning at 90% accuracy w/ simple physical task    Time  8    Period  Weeks    Status  New  Plan - 05/15/19 1058    Clinical Impression Statement  Pt progressing with RUE function and strength. Mild edema Rt hand    Occupational performance deficits (Please refer to evaluation for details):  ADL's;IADL's;Work;Leisure    Body Structure / Function / Physical Skills  ADL;IADL;Balance;Body mechanics;Sensation;Mobility;Strength;Coordination;FMC;UE functional use;Decreased knowledge of use of DME    Rehab Potential  Good    Comorbidities impacting occupational performance description:  obesity    OT Frequency  2x / week    OT Duration  8 weeks    OT Treatment/Interventions  Self-care/ADL training;Therapeutic exercise;Functional Mobility Training;Aquatic Therapy;Neuromuscular education;Manual Therapy;Therapeutic activities;Coping strategies training;DME and/or AE instruction;Passive range of motion;Patient/family education     Plan  practice donning/doffing shoes and socks w/ AE prn (pt to bring in socks and tie up shoes), continue to work on proximal shoulder girdle strength and review HEP prn, monitor edema Rt hand prn    Consulted and Agree with Plan of Care  Patient       Patient will benefit from skilled therapeutic intervention in order to improve the following deficits and impairments:   Body Structure / Function / Physical Skills: ADL, IADL, Balance, Body mechanics, Sensation, Mobility, Strength, Coordination, FMC, UE functional use, Decreased knowledge of use of DME       Visit Diagnosis: Hemiplegia and hemiparesis following cerebral infarction affecting right non-dominant side (HCC)  Muscle weakness (generalized)    Problem List Patient Active Problem List   Diagnosis Date Noted  . DM (diabetes mellitus), type 2 with peripheral vascular complications (HCC) 04/24/2019  . Lacunar stroke (HCC) 04/12/2019    Kelli Churn, OTR/L 05/15/2019, 10:59 AM  [facility] [address] [place], [place], [address] Phone: [phone]   Fax:  [phone]  Name: Ladarius Seubert. MRN: [id number] Date of Birth: [birth year]

## 2019-05-17 ENCOUNTER — Other Ambulatory Visit: Payer: Self-pay

## 2019-05-17 ENCOUNTER — Ambulatory Visit: Payer: BC Managed Care – PPO | Admitting: Occupational Therapy

## 2019-05-17 ENCOUNTER — Ambulatory Visit: Payer: BC Managed Care – PPO

## 2019-05-17 DIAGNOSIS — R278 Other lack of coordination: Secondary | ICD-10-CM

## 2019-05-17 DIAGNOSIS — M6281 Muscle weakness (generalized): Secondary | ICD-10-CM

## 2019-05-17 DIAGNOSIS — I69353 Hemiplegia and hemiparesis following cerebral infarction affecting right non-dominant side: Secondary | ICD-10-CM

## 2019-05-17 DIAGNOSIS — R2689 Other abnormalities of gait and mobility: Secondary | ICD-10-CM | POA: Diagnosis not present

## 2019-05-17 NOTE — Therapy (Signed)
[facility] [address] [place], [place], [address] Phone: [phone]   Fax:  [phone]  Occupational Therapy Treatment  Patient Details  Name: Benjamin Rosales. MRN: [id number] Date of Birth: 1955/04/12 No data recorded  Encounter Date: 05/17/2019  OT End of Session - 05/17/19 0815    Visit Number  4    Number of Visits  17    Date for OT Re-Evaluation  07/02/19    Authorization Type  BC/BS [employer] - 20 VL each discipline    Authorization - Visit Number  4    Authorization - Number of Visits  20    OT Start Time  [phone]    OT Stop Time  0845    OT Time Calculation (min)  39 min    Activity Tolerance  Patient tolerated treatment well    Behavior During Therapy  WFL for tasks assessed/performed       Past Medical History:  Diagnosis Date  . Hypertension     Past Surgical History:  Procedure Laterality Date  . TONSILLECTOMY      There were no vitals filed for this visit.  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 PMH: obesity, T2DM, HTN, HLD, ETOH abuse, chronic small vessel ischemic dz    Limitations  driving    Currently in Pain?  No/denies             Treatment: Reviewed HEP issued last visit, 15 reps each, min v.c Pt practiced doffing R shoe and donning in seated using footstool and long handled shoe horn, min v.c  Pt reports that he donned his shoes and socks modified independently today but he donned shoe in standing. Therapist recommends pt sits for safety. Copying small peg design with RUE for increased fine motor coordination, min v.c for design, then removing pegs with in hand manipulation, min difficulty/ drops.                OT Short Term Goals - 05/08/19 1201      OT SHORT TERM GOAL #1   Title  Independent with RUE strengthening HEP    Time  4    Period  Weeks    Status  On-going       OT SHORT TERM GOAL #2   Title  Independent with LE dressing using A/E PRN    Time  4    Period  Weeks    Status  On-going      OT SHORT TERM GOAL #3   Title  Independent with coordination and putty HEP for Rt hand    Time  4    Period  Weeks    Status  Achieved      OT SHORT TERM GOAL #4   Title  Pt to perform 10 min or more of dynamic standing w/o rest or LOB    Time  4    Period  Weeks    Status  On-going      OT SHORT TERM GOAL #5   Title  Improve coordination Rt hand as evidenced by reducing speed on 9 hole peg test to 50 sec. or less    Baseline  60.66 sec    Time  4    Period  Weeks    Status  New      OT SHORT TERM GOAL #6   Title  Pt to cut food w/ A/E prn safely    Time  4    Period  Weeks    Status  On-going        OT Long Term Goals - 05/02/19 1329      OT LONG TERM GOAL #1   Title  Pt to perform full cooking tasks safely at mod I level w/ only 1 rest break    Time  8    Period  Weeks    Status  New      OT LONG TERM GOAL #2   Title  Pt to perform heavier cleaning tasks safely at mod I level w/ rest breaks prn (taking out trash, vacuuming, etc)    Time  8    Period  Weeks    Status  New      OT LONG TERM GOAL #3   Title  Pt to improve coordination as evidenced by reducing speed on 9 hole peg test to 40 sec. or less Rt hand    Baseline  60.66 sec    Time  8    Period  Weeks    Status  New      OT LONG TERM GOAL #4   Title  Grip strength Rt hand to be 50 lbs or greater    Baseline  35.9 lbs    Time  8    Period  Weeks    Status  New      OT LONG TERM GOAL #5   Title  Pt to demo sufficient strength RUE to retrieve/replace 5 lb object from high shelf w/o pain and min compensations    Time  8    Period  Weeks    Status  New      Long Term Additional Goals   Additional Long Term Goals  Yes      OT LONG TERM GOAL #6   Title  Pt to perform environmental scanning at 90% accuracy w/ simple physical task    Time  8    Period  Weeks     Status  New            Plan - 05/17/19 0815    Clinical Impression Statement  Pt progressing towards goals with RUE function and strength.    Occupational performance deficits (Please refer to evaluation for details):  ADL's;IADL's;Work;Leisure    Body Structure / Function / Physical Skills  ADL;IADL;Balance;Body mechanics;Sensation;Mobility;Strength;Coordination;FMC;UE functional use;Decreased knowledge of use of DME    Rehab Potential  Good    Comorbidities impacting occupational performance description:  obesity    OT Frequency  2x / week    OT Duration  8 weeks    OT Treatment/Interventions  Self-care/ADL training;Therapeutic exercise;Functional Mobility Training;Aquatic Therapy;Neuromuscular education;Manual Therapy;Therapeutic activities;Coping strategies training;DME and/or AE instruction;Passive range of motion;Patient/family education    Plan  continue to work on proximal shoulder girdle strength and review HEP prn, monitor edema Rt hand prn    Consulted and Agree with Plan of Care  Patient       Patient will benefit from skilled therapeutic intervention in order to improve the following deficits and impairments:   Body Structure / Function / Physical Skills: ADL, IADL, Balance, Body mechanics, Sensation, Mobility, Strength, Coordination, FMC, UE functional use, Decreased knowledge of use of DME       Visit Diagnosis: Hemiplegia and hemiparesis following cerebral infarction affecting right non-dominant side (HCC)  Muscle weakness (generalized)  Other abnormalities of gait and mobility  Other lack of coordination    Problem List Patient Active Problem List   Diagnosis Date Noted  . DM (diabetes mellitus), type 2 with peripheral vascular complications (HCC) 04/24/2019  . Lacunar stroke (HCC) 04/12/2019    Bernese Doffing 05/17/2019, 8:40 AM  [facility] [address] [place], [place], [address] Phone:  [phone]   Fax:  [phone]  Name: Rodolphe Edmonston. MRN: [id number] Date of Birth: 09-27-1955

## 2019-05-17 NOTE — Patient Instructions (Signed)
Access Code: [email or web address] URL: [email or web address] Date: 05/17/2019 Prepared by: Elmer Bales  Exercises Sit to/from Stand in Stride position - 1 x daily - 5 x weekly - 5 reps - 2 sets Side Stepping with Resistance at Thighs and Counter Support - 1 x daily - 5 x weekly - 3 reps - 1 sets Standing Single Leg Stance with Counter Support - 1 x daily - 5 x weekly - 3 reps - 1 sets - 10 hold Romberg Stance Eyes Closed on Foam Pad - 1 x daily - 5 x weekly - 1 sets - 3 reps - 30 hold Wide Stance with Eyes Closed and Head Rotation on Foam Pad - 1 x daily - 5 x weekly - 1 sets - 10 reps Wide Tandem Stance on Foam Pad with Eyes Closed - 1 x daily - 5 x weekly - 1 sets - 2-3 reps - 20 hold Clamshell with Resistance - 1 x daily - 5 x weekly - 2 sets - 10 reps Sidelying Hip Abduction with Resistance at Thighs - 1 x daily - 5 x weekly - 1 sets - 10 reps Mini Squat with Counter Support - 1 x daily - 5 x weekly - 2 sets - 10 reps

## 2019-05-17 NOTE — Therapy (Signed)
[facility] [address] [place], [place], [address] Phone: [phone]   Fax:  [phone]  Physical Therapy Treatment  Patient Details  Name: Benjamin Rosales. MRN: [id number] Date of Birth: [birth year] Referring Provider (PT): Referred by Pecola Leisure but will be following up with Dr. Leta Baptist   Encounter Date: 05/17/2019  PT End of Session - 05/17/19 0851    Visit Number  4    Number of Visits  17    Date for PT Re-Evaluation  07/01/19    Authorization Type  FOTO,   [employer] 20 PT visits but additional visits can be requested by patient through their nurse care coordinator    PT Start Time  [phone]    PT Stop Time  0932    PT Time Calculation (min)  43 min    Equipment Utilized During Treatment  Gait belt    Activity Tolerance  Patient tolerated treatment well    Behavior During Therapy  WFL for tasks assessed/performed       Past Medical History:  Diagnosis Date  . Hypertension     Past Surgical History:  Procedure Laterality Date  . TONSILLECTOMY      There were no vitals filed for this visit.  Subjective Assessment - 05/17/19 0850    Subjective  Pt reports he is doing ok. Reports exercises are going but slow.    Pertinent History  Symptoms started 3 days prior to going to hospital on 04/12/19 with right side weakness, slurred speech. Found to have acute lacunar infarct. Likely new onset DM.PMH: morbid obesity, HTN, alcholol abuse, hyperlipidemia    Patient Stated Goals  Pt would like to improve strength on right side and improve stamina.    Currently in Pain?  Yes    Pain Score  0-No pain   up to 5-6/31 if steps certain ways   Pain Location  Knee    Pain Orientation  Right;Medial    Pain Descriptors / Indicators  Sharp    Pain Type  Chronic pain                       [facility] Adult PT Treatment/Exercise - 05/17/19 0854      Ambulation/Gait   Ambulation/Gait  Yes     Ambulation/Gait Assistance  5: Supervision    Ambulation/Gait Assistance Details  Around gym for activities. Pt has wide BOS with increase lateral sway and slight right antalgic gait.    Assistive device  None    Gait Pattern  Step-through pattern;Decreased stance time - right;Wide base of support;Antalgic      Neuro Re-ed    Neuro Re-ed Details   Standing balance exercises in corner on pillows: feet apart eyes closed without UE support x 30 sec, eyes closed with head turns left/right x 10, feet together eyes closed x 30 sec, feet together eyes open with head turns left/right x 10 and up/down x 10 then repeated with eyes closed. Pt had increased sway with narrow BOS with eyes closed but able to perform without touching anything. Pt performed marching in place on pillows without UE support x 10 with verbal cues to tighten gluts on stance side. Less steady with right SLS.       Exercises   Exercises  Other Exercises    Other Exercises   Sidelying right clamshell  with red theraband x 20, right sidelying hip abd with red theraband x 10. Decreased control with hip abd  especially with eccentric lowering. Step-to 2nd step x 10 then x 10 with 2# ankle weight on right, step-ups on 6" step x 3 with right LE but increased pain and trouble to control with right knee so stopped, mini-squats x 10 with verbal cues for form. BP= 178/92 after             PT Education - 05/17/19 1054    Education Details  Added more right hip strengthening to HEP    Person(s) Educated  Patient    Methods  Explanation;Demonstration;Handout    Comprehension  Verbalized understanding;Returned demonstration       PT Short Term Goals - 05/08/19 0803      PT SHORT TERM GOAL #1   Title  Pt will be independent with initial HEP for strengthening and balance to continue gains on own.    Time  4    Period  Weeks    Status  On-going    Target Date  06/02/19      PT SHORT TERM GOAL #2   Title  Pt's balance/gait will be  further assessed with FGA testing and LTG written.    Baseline  05/08/19: met in session today with score of 11/30. Primary PT to set goals.    Status  Achieved    Target Date  06/02/19      PT SHORT TERM GOAL #3   Title  6 min walk test will be performed to further assess gait and activity tolerance to get baseline for LTG.    Baseline  05/08/19: met in session today with distance of 735 feet no AD, one brief standing rest break. Primary PT to set goals.    Status  Achieved    Target Date  06/02/19      PT SHORT TERM GOAL #4   Title  Pt will increase 30 sec sit to stand from 11 reps to 13 or more reps from standard chair for improved functional strength.    Baseline  11 reps on 05/02/19    Time  4    Period  Weeks    Status  On-going    Target Date  06/02/19      PT SHORT TERM GOAL #5   Title  Pt will report <6/10 pain in right knee with activities for improved function.    Time  4    Period  Weeks    Status  On-going    Target Date  06/02/19        PT Long Term Goals - 05/17/19 1056      PT LONG TERM GOAL #1   Title  Pt will be independent with progressive HEP for strength, balance and functional mobility to continue gains on own.    Time  8    Period  Weeks    Status  New      PT LONG TERM GOAL #2   Title  Pt will increase gait speed from 0.101ms to >0.782m for improved gait safety in community.    Baseline  0.5684mon 05/02/19    Time  8    Period  Weeks    Status  New      PT LONG TERM GOAL #3   Title  Pt will decrease TUG from 18.88 sec to <15 sec for improved balance and functional strength.    Baseline  18.88sec on 05/02/19    Time  8    Period  Weeks    Status  New  PT LONG TERM GOAL #4   Title  Pt will ambulate >500' on varied surfaces independently for safe community distances.    Time  8    Period  Weeks    Status  New      PT LONG TERM GOAL #5   Title  Pt will increase 6 min walk distance from 735' to 935' for improved gait and activity tolerance.     Baseline  735' on 05/08/19 with 1 standing rest break    Time  8    Period  Weeks    Status  Revised      PT LONG TERM GOAL #6   Title  Pt will ambulate up/down 4 steps without railing independently to safely enter/exit home.    Time  8    Period  Weeks    Status  New      PT LONG TERM GOAL #7   Title  Pt will increase FGA from 11/30 to 16/30 or more for improved balance and gait.    Baseline  11/30 on 05/08/19    Time  8    Period  Weeks    Status  New    Target Date  07/02/19            Plan - 05/17/19 0852    Clinical Impression Statement  PT updated LTG with 6 min based on 6 min ans FGA scores from prior session. Pt had less sway with corner balance activities today. Had been unable to try yet at home. Focused on right hip strengthening today. Modified exercises as needed based on right knee pain.    Personal Factors and Comorbidities  Comorbidity 3+;Social Background    Comorbidities  morbid obesity, HTN, alcholol abuse, hyperlipidemia    Examination-Activity Limitations  Locomotion Level;Stand;Stairs    Examination-Participation Restrictions  Yard Work;Community Activity   job   Stability/Clinical Decision Making  Evolving/Moderate complexity    Rehab Potential  Good    PT Frequency  2x / week    PT Duration  8 weeks    PT Treatment/Interventions  ADLs/Self Care Home Management;Cryotherapy;Moist Heat;Therapeutic exercise;Therapeutic activities;Functional mobility training;Stair training;Gait training;DME Instruction;Balance training;Neuromuscular re-education;Patient/family education;Manual techniques;Passive range of motion;Energy conservation    PT Next Visit Plan  continue to address LE strengthening in session, balance reactions on floor progressing to on complaint surfaces, aerobic activities. Monitor BP as was a little elevated at end of session.    PT Home Exercise Plan  Access Code: [email or web address]    Consulted and Agree with Plan of Care  Patient;Other (Comment)    girlfriend      Patient will benefit from skilled therapeutic intervention in order to improve the following deficits and impairments:  Abnormal gait, Cardiopulmonary status limiting activity, Decreased activity tolerance, Decreased balance, Decreased endurance, Decreased coordination, Decreased strength, Pain  Visit Diagnosis: Muscle weakness (generalized)  Other abnormalities of gait and mobility     Problem List Patient Active Problem List   Diagnosis Date Noted  . DM (diabetes mellitus), type 2 with peripheral vascular complications ([facility]) 79/04/4095  . Lacunar stroke ([place]) 04/12/2019    Electa Sniff, PT, DPT, NCS 05/17/2019, 11:27 AM  [facility] [address] [place], [place], [address] Phone: [phone]   Fax:  [phone]  Name: Benjamin Rosales. MRN: [id number] Date of Birth: [birth year]

## 2019-05-20 ENCOUNTER — Encounter: Payer: Self-pay | Admitting: Diagnostic Neuroimaging

## 2019-05-20 ENCOUNTER — Other Ambulatory Visit: Payer: Self-pay

## 2019-05-20 ENCOUNTER — Ambulatory Visit (INDEPENDENT_AMBULATORY_CARE_PROVIDER_SITE_OTHER): Payer: BC Managed Care – PPO | Admitting: Diagnostic Neuroimaging

## 2019-05-20 VITALS — BP 150/83 | HR 92 | Temp 98.2°F | Ht 67.0 in | Wt 290.4 lb

## 2019-05-20 DIAGNOSIS — I639 Cerebral infarction, unspecified: Secondary | ICD-10-CM

## 2019-05-20 DIAGNOSIS — I6350 Cerebral infarction due to unspecified occlusion or stenosis of unspecified cerebral artery: Secondary | ICD-10-CM | POA: Diagnosis not present

## 2019-05-20 NOTE — Progress Notes (Signed)
[facility]  PATIENT: Benjamin Rosales. DOB: 12/19/1955  REFERRING CLINICIAN: Jonita Albee, MD HISTORY FROM: patient and SO REASON FOR VISIT: new consult     HISTORICAL  CHIEF COMPLAINT:  Chief Complaint  Patient presents with  . Cerebrovascular Accident    rm 6 hospital FU, sig other- [facility], "in neuro rehab for PT/OT"    HISTORY OF PRESENT ILLNESS:   64 year old male here for evaluation of stroke.  History of hypertension, diabetes, hyperlipidemia, obesity, alcohol abuse.  04/12/2019 patient woke up with right-sided weakness and slurred speech.  Patient to the hospital for evaluation.  He was diagnosed with left pontine ischemic infarction.  Patient had been drinking alcohol regularly and had stopped that week.  He was having trouble sleeping the few days prior to the stroke.  Since that time patient has been taking his medications.  He has been going through physical and occupational therapy.  Symptoms are slightly improving over time.   REVIEW OF SYSTEMS: Full 14 system review of systems performed and negative with exception of: As per HPI.  ALLERGIES: No Known Allergies  HOME MEDICATIONS: Outpatient Medications Prior to Visit  Medication Sig Dispense Refill  . amLODipine (NORVASC) 10 MG tablet Take 1 tablet (10 mg total) by mouth daily. 30 tablet 11  . aspirin 81 MG chewable tablet Chew 1 tablet (81 mg total) by mouth daily. 30 tablet 11  . atorvastatin (LIPITOR) 40 MG tablet Take 1 tablet (40 mg total) by mouth daily at 6 PM. 30 tablet 11  . Cyanocobalamin (VITAMIN B12 PO) Take 1 tablet by mouth daily.    . folic acid (FOLVITE) 1 MG tablet TAKE 1 TABLET BY MOUTH EVERY DAY 90 tablet 2  . glucose blood (ONETOUCH VERIO) test strip Use to test 1-2 times daily. 200 each 12  . hydrALAZINE (APRESOLINE) 25 MG tablet Take 1 tablet (25 mg total) by mouth 3 (three) times daily. 90 tablet 5  . hydrochlorothiazide (HYDRODIURIL) 12.5 MG tablet Take 2  tablets (25 mg total) by mouth daily. 180 tablet 1  . metFORMIN (GLUCOPHAGE) 500 MG tablet Take 1 tablet (500 mg total) by mouth 2 (two) times daily with a meal. 60 tablet 11  . Multiple Vitamin (MULTIVITAMIN) tablet Take 1 tablet by mouth daily.    Marland Kitchen thiamine 100 MG tablet Take 1 tablet (100 mg total) by mouth daily. 30 tablet 5   No facility-administered medications prior to visit.    PAST MEDICAL HISTORY: Past Medical History:  Diagnosis Date  . Diabetes mellitus without complication (HCC)   . Hypercholesteremia   . Hypertension   . Stroke [facility]) 04/2019    PAST SURGICAL HISTORY: Past Surgical History:  Procedure Laterality Date  . TONSILLECTOMY      FAMILY HISTORY: Family History  Problem Relation Age of Onset  . High blood pressure Father   . Heart disease Father   . High blood pressure Brother   . Diabetes Brother     SOCIAL HISTORY: Social History   Socioeconomic History  . Marital status: Single    Spouse name: Not on file  . Number of children: 0  . Years of education: 36  . Highest education level: Not on file  Occupational History    Comment: NA  Tobacco Use  . Smoking status: Never Smoker  . Smokeless tobacco: Never Used  Substance and Sexual Activity  . Alcohol use: Not Currently    Alcohol/week: 28.0 standard drinks    Types: 28  Shots of liquor per week    Comment: 1 pint of liquor per day, 05/20/19 none  . Drug use: Never  . Sexual activity: Not on file  Other Topics Concern  . Not on file  Social History Narrative      Caffeine- none   Social Determinants of Health   Financial Resource Strain:   . Difficulty of Paying Living Expenses:   Food Insecurity:   . Worried About Charity fundraiser in the Last Year:   . Arboriculturist in the Last Year:   Transportation Needs:   . Film/video editor (Medical):   Marland Kitchen Lack of Transportation (Non-Medical):   Physical Activity:   . Days of Exercise per Week:   . Minutes of Exercise per  Session:   Stress:   . Feeling of Stress :   Social Connections:   . Frequency of Communication with Friends and Family:   . Frequency of Social Gatherings with Friends and Family:   . Attends Religious Services:   . Active Member of Clubs or Organizations:   . Attends Archivist Meetings:   Marland Kitchen Marital Status:   Intimate Partner Violence:   . Fear of Current or Ex-Partner:   . Emotionally Abused:   Marland Kitchen Physically Abused:   . Sexually Abused:      PHYSICAL EXAM  GENERAL EXAM/CONSTITUTIONAL: Vitals:  Vitals:   05/20/19 1017  BP: (!) 150/83  Pulse: 92  Temp: 98.2 F (36.8 C)  Weight: 290 lb 6.4 oz (131.7 kg)  Height: 5\' 7"  (1.702 m)     Body mass index is 45.48 kg/m. Wt Readings from Last 3 Encounters:  05/20/19 290 lb 6.4 oz (131.7 kg)  04/24/19 297 lb (134.7 kg)  04/12/19 285 lb 11.5 oz (129.6 kg)     Patient is in no distress; well developed, nourished and groomed; neck is supple  CARDIOVASCULAR:  Examination of carotid arteries is normal; no carotid bruits  Regular rate and rhythm, no murmurs  Examination of peripheral vascular system by observation and palpation is normal  EYES:  Ophthalmoscopic exam of optic discs and posterior segments is normal; no papilledema or hemorrhages  No exam data present  MUSCULOSKELETAL:  Gait, strength, tone, movements noted in Neurologic exam below  NEUROLOGIC: MENTAL STATUS:  No flowsheet data found.  awake, alert, oriented to person, place and time  recent and remote memory intact  normal attention and concentration  language fluent, comprehension intact, naming intact  fund of knowledge appropriate  CRANIAL NERVE:   2nd - no papilledema on fundoscopic exam  2nd, 3rd, 4th, 6th - pupils equal and reactive to light, visual fields full to confrontation, extraocular muscles intact, no nystagmus  5th - facial sensation symmetric  7th - facial strength symmetric  8th - hearing intact  9th -  palate elevates symmetrically, uvula midline  11th - shoulder shrug symmetric  12th - tongue protrusion midline  MOTOR:   normal bulk and tone, full strength in the BUE, BLE; EXCEPT SLIGHTLY DECR IN RIGHT TRICEPS AND FINGER ABDUCTION (4+) AND RIGHT HIP FLEXION (4)  SENSORY:   normal and symmetric to light touch, temperature, vibration  COORDINATION:   finger-nose-finger, fine finger movements SLOW ON RIGHT SIDE  REFLEXES:   deep tendon reflexes present and symmetric; EXCEPT SLIGHTLY INCREASED IN RIGHT ARM  GAIT/STATION:   RIGHT HEMIPARETIC GAIT     DIAGNOSTIC DATA (LABS, IMAGING, TESTING) - I reviewed patient records, labs, notes, testing and imaging myself where  available.  Lab Results  Component Value Date   WBC 5.9 04/14/2019   HGB 14.6 04/14/2019   HCT 41.6 04/14/2019   MCV 89.1 04/14/2019   PLT 185 04/14/2019      Component Value Date/Time   NA 138 04/14/2019 0452   K 3.9 04/14/2019 0452   CL 103 04/14/2019 0452   CO2 26 04/14/2019 0452   GLUCOSE 140 (H) 04/14/2019 0452   BUN 11 04/14/2019 0452   CREATININE 0.94 04/14/2019 0452   CALCIUM 9.3 04/14/2019 0452   PROT 6.1 (L) 04/14/2019 0452   ALBUMIN 3.6 04/14/2019 0452   AST 29 04/14/2019 0452   ALT 52 (H) 04/14/2019 0452   ALKPHOS 58 04/14/2019 0452   BILITOT 1.0 04/14/2019 0452   GFRNONAA >60 04/14/2019 0452   GFRAA >60 04/14/2019 0452   Lab Results  Component Value Date   CHOL 230 (H) 04/13/2019   HDL 49 04/13/2019   LDLCALC 145 (H) 04/13/2019   TRIG 181 (H) 04/13/2019   CHOLHDL 4.7 04/13/2019   Lab Results  Component Value Date   HGBA1C 6.8 (H) 04/13/2019   No results found for: VITAMINB12 No results found for: TSH  STROKE WORKUP (Feb 2021)  MRI head - 2.2 x 1.1 x 0.7 mm acute ischemic nonhemorrhagic left paramedian pontine infarct. Underlying mild cerebral atrophy with moderate chronic small vessel ischemic disease.   CTA H&N - sites of moderate/severe stenosis within a proximal  right M2 branch and proximal to mid A2 left anterior cerebral artery.   Carotid Doppler - CTA neck performed - carotid dopplers not indicated.  2D Echo - EF 60 - 65%. No cardiac source of emboli identified.    ASSESSMENT AND PLAN  64 y.o. year old male here with:  Dx:  1. Left pontine stroke (HCC)     PLAN:  LEFT PONTINE ISCHEMIC INFARCTION (small vessel thrombosis due to HTN, DM, hyperlipidemia) - continue aspirin, statin, DM control, BP control - follow OSA evaluation - optimize nutrition, phys activity - gradually increase activity; may return to work pending OT evaluation and recs (works as Orthoptist / Biochemist, clinical at [employer])  Return for return to PCP, pending if symptoms worsen or fail to improve.    Suanne Marker, MD 05/20/2019, 10:47 AM Certified in Neurology, Neurophysiology and Neuroimaging  [facility] [address] [place], [place] [address] [phone]

## 2019-05-20 NOTE — Patient Instructions (Addendum)
LEFT PONTINE ISCHEMIC INFARCTION (small vessel thrombosis due to HTN, DM, hyperlipidemia) - continue aspirin, statin, DM control, BP control - follow sleep apnea evaluation - optimize nutrition, phys activity - gradually increase activity; may return to work pending OT evaluation and recs

## 2019-05-21 ENCOUNTER — Ambulatory Visit: Payer: BC Managed Care – PPO | Admitting: Occupational Therapy

## 2019-05-21 ENCOUNTER — Other Ambulatory Visit: Payer: Self-pay

## 2019-05-21 ENCOUNTER — Encounter: Payer: Self-pay | Admitting: Physical Therapy

## 2019-05-21 ENCOUNTER — Ambulatory Visit: Payer: BC Managed Care – PPO | Admitting: Physical Therapy

## 2019-05-21 VITALS — BP 155/81 | HR 78

## 2019-05-21 DIAGNOSIS — M6281 Muscle weakness (generalized): Secondary | ICD-10-CM

## 2019-05-21 DIAGNOSIS — R278 Other lack of coordination: Secondary | ICD-10-CM

## 2019-05-21 DIAGNOSIS — I69353 Hemiplegia and hemiparesis following cerebral infarction affecting right non-dominant side: Secondary | ICD-10-CM

## 2019-05-21 DIAGNOSIS — R2689 Other abnormalities of gait and mobility: Secondary | ICD-10-CM | POA: Diagnosis not present

## 2019-05-21 NOTE — Therapy (Signed)
[facility] [address] [place], [place], [address] Phone: [phone]   Fax:  [phone]  Physical Therapy Treatment  Patient Details  Name: Benjamin Rosales. MRN: [id number] Date of Birth: 07-20-1955 Referring Provider (PT): Referred by Pecola Leisure but will be following up with Dr. Leta Baptist   Encounter Date: 05/21/2019  PT End of Session - 05/21/19 1105    Visit Number  5    Number of Visits  17    Date for PT Re-Evaluation  07/01/19    Authorization Type  FOTO,   BCBS 20 PT visits but additional visits can be requested by patient through their nurse care coordinator    PT Start Time  1101    PT Stop Time  1142    PT Time Calculation (min)  41 min    Equipment Utilized During Treatment  Gait belt    Activity Tolerance  Patient tolerated treatment well    Behavior During Therapy  [facility] for tasks assessed/performed       Past Medical History:  Diagnosis Date  . Diabetes mellitus without complication ([place])   . Hypercholesteremia   . Hypertension   . Stroke [facility]) 04/2019    Past Surgical History:  Procedure Laterality Date  . TONSILLECTOMY      Vitals:   05/21/19 1105  BP: (!) 155/81  Pulse: 78    Subjective Assessment - 05/21/19 1105    Subjective  No new complaints. No falls or pain to report. New strengthening ex's for HEP are going well.    Pertinent History  Symptoms started 3 days prior to going to hospital on 04/12/19 with right side weakness, slurred speech. Found to have acute lacunar infarct. Likely new onset DM.PMH: morbid obesity, HTN, alcholol abuse, hyperlipidemia    Patient Stated Goals  Pt would like to improve strength on right side and improve stamina.    Currently in Pain?  No/denies    Pain Score  0-No pain            [facility] Adult PT Treatment/Exercise - 05/21/19 1111      Transfers   Transfers  Sit to Stand;Stand to Sit    Sit to Stand  5: Supervision;With upper  extremity assist;From chair/3-in-1    Stand to Sit  5: Supervision;With upper extremity assist;To chair/3-in-1      Ambulation/Gait   Ambulation/Gait  Yes    Ambulation/Gait Assistance  5: Supervision    Ambulation/Gait Assistance Details  around gym with session.     Assistive device  None    Gait Pattern  Step-through pattern;Decreased stance time - right;Wide base of support;Antalgic    Ambulation Surface  Level;Indoor      High Level Balance   High Level Balance Activities  Side stepping;Marching forwards;Marching backwards;Tandem walking    High Level Balance Comments  on red mat in parallel bars with light UE support on bars: 3 laps each with cues on form and technique. min guard assist for balance.        Knee/Hip Exercises: Aerobic   Other Aerobic  Scifit LE's, some UE's level 2.5 for 6 minutes with goal rpm 40-45 for strengthening and activity tolerance      Knee/Hip Exercises: Standing   Forward Step Up  Both;1 set;5 reps;10 reps;Hand Hold: 2;Step Height: 6";Limitations    Forward Step Up Limitations  with left foot on step- pt lifted right LE up for march/back to floor for 10 reps; with right foot on  step- pt stepped left foot up to step/back down for 5 reps before needing to stop due to pain/fatigue in right LE. bil UE support needed with increased time/effort with right LE step ups. min guard assist for safety.           Balance Exercises - 05/21/19 1131      Balance Exercises: Standing   Rockerboard  Anterior/posterior;Head turns;EO;EC;30 seconds;10 reps;Limitations    Rockerboard Limitations  on board in ant/post direction only, light to no UE support: rocking the board with EO, progressing to [facility]; then holding the board steady for EC no head movements, progressing to EC head movements left<>right, up<>down. min guard to min assist with cues on posture and weight shifting for balance assistance.            PT Short Term Goals - 05/08/19 0803      PT SHORT TERM  GOAL #1   Title  Pt will be independent with initial HEP for strengthening and balance to continue gains on own.    Time  4    Period  Weeks    Status  On-going    Target Date  06/02/19      PT SHORT TERM GOAL #2   Title  Pt's balance/gait will be further assessed with FGA testing and LTG written.    Baseline  05/08/19: met in session today with score of 11/30. Primary PT to set goals.    Status  Achieved    Target Date  06/02/19      PT SHORT TERM GOAL #3   Title  6 min walk test will be performed to further assess gait and activity tolerance to get baseline for LTG.    Baseline  05/08/19: met in session today with distance of 735 feet no AD, one brief standing rest break. Primary PT to set goals.    Status  Achieved    Target Date  06/02/19      PT SHORT TERM GOAL #4   Title  Pt will increase 30 sec sit to stand from 11 reps to 13 or more reps from standard chair for improved functional strength.    Baseline  11 reps on 05/02/19    Time  4    Period  Weeks    Status  On-going    Target Date  06/02/19      PT SHORT TERM GOAL #5   Title  Pt will report <6/10 pain in right knee with activities for improved function.    Time  4    Period  Weeks    Status  On-going    Target Date  06/02/19        PT Long Term Goals - 05/17/19 1056      PT LONG TERM GOAL #1   Title  Pt will be independent with progressive HEP for strength, balance and functional mobility to continue gains on own.    Time  8    Period  Weeks    Status  New      PT LONG TERM GOAL #2   Title  Pt will increase gait speed from 0.34ms to >0.739m for improved gait safety in community.    Baseline  0.5647mon 05/02/19    Time  8    Period  Weeks    Status  New      PT LONG TERM GOAL #3   Title  Pt will decrease TUG from 18.88 sec to <15 sec for improved  balance and functional strength.    Baseline  18.88sec on 05/02/19    Time  8    Period  Weeks    Status  New      PT LONG TERM GOAL #4   Title  Pt will  ambulate >500' on varied surfaces independently for safe community distances.    Time  8    Period  Weeks    Status  New      PT LONG TERM GOAL #5   Title  Pt will increase 6 min walk distance from 735' to 935' for improved gait and activity tolerance.    Baseline  735' on 05/08/19 with 1 standing rest break    Time  8    Period  Weeks    Status  Revised      PT LONG TERM GOAL #6   Title  Pt will ambulate up/down 4 steps without railing independently to safely enter/exit home.    Time  8    Period  Weeks    Status  New      PT LONG TERM GOAL #7   Title  Pt will increase FGA from 11/30 to 16/30 or more for improved balance and gait.    Baseline  11/30 on 05/08/19    Time  8    Period  Weeks    Status  New    Target Date  07/02/19            Plan - 05/21/19 1106    Clinical Impression Statement  Today's skilled session continued to focus on LE strengthening and balance reactions with rest breaks taken as needed. Increased knee pain/fatigue with step ups that was alleviated by rest. No other issues reported with session with other rest breaks taken as needed. The pt is progressing toward goals and should benefit from continued PT to progress toward unmet goals.    Personal Factors and Comorbidities  Comorbidity 3+;Social Background    Comorbidities  morbid obesity, HTN, alcholol abuse, hyperlipidemia    Examination-Activity Limitations  Locomotion Level;Stand;Stairs    Examination-Participation Restrictions  Yard Work;Community Activity   job   Stability/Clinical Decision Making  Evolving/Moderate complexity    Rehab Potential  Good    PT Frequency  2x / week    PT Duration  8 weeks    PT Treatment/Interventions  ADLs/Self Care Home Management;Cryotherapy;Moist Heat;Therapeutic exercise;Therapeutic activities;Functional mobility training;Stair training;Gait training;DME Instruction;Balance training;Neuromuscular re-education;Patient/family education;Manual techniques;Passive  range of motion;Energy conservation    PT Next Visit Plan  continue to address LE strengthening in session, balance reactions on floor progressing to on complaint surfaces, aerobic activities. Monitor BP as was a little elevated at end of session.    PT Home Exercise Plan  Access Code: [email or web address]    Consulted and Agree with Plan of Care  Patient;Other (Comment)   girlfriend      Patient will benefit from skilled therapeutic intervention in order to improve the following deficits and impairments:  Abnormal gait, Cardiopulmonary status limiting activity, Decreased activity tolerance, Decreased balance, Decreased endurance, Decreased coordination, Decreased strength, Pain  Visit Diagnosis: Muscle weakness (generalized)  Other abnormalities of gait and mobility     Problem List Patient Active Problem List   Diagnosis Date Noted  . DM (diabetes mellitus), type 2 with peripheral vascular complications ([place]) 54/49/2010  . Lacunar stroke ([place]) 04/12/2019    Willow Ora, PTA, [facility] [address] [place], [place] [address] [phone] 05/21/19, 7:42 PM  Name: Benjamin Rosales. MRN: [id number] Date of Birth: 1955/12/20

## 2019-05-21 NOTE — Therapy (Signed)
[facility] [address] [place], [place], [address] Phone: [phone]   Fax:  [phone]  Occupational Therapy Treatment  Patient Details  Name: Benjamin Rosales. MRN: [id number] Date of Birth: 02-12-1956 No data recorded  Encounter Date: 05/21/2019  OT End of Session - 05/21/19 1146    Visit Number  5    Number of Visits  17    Date for OT Re-Evaluation  07/02/19    Authorization Type  BC/BS [employer] - 20 VL each discipline    Authorization - Visit Number  5    Authorization - Number of Visits  20    OT Start Time  1145    OT Stop Time  1230    OT Time Calculation (min)  45 min    Activity Tolerance  Patient tolerated treatment well    Behavior During Therapy  WFL for tasks assessed/performed       Past Medical History:  Diagnosis Date  . Diabetes mellitus without complication (HCC)   . Hypercholesteremia   . Hypertension   . Stroke [facility]) 04/2019    Past Surgical History:  Procedure Laterality Date  . TONSILLECTOMY      There were no vitals filed for this visit.  Subjective Assessment - 05/21/19 1145    Subjective   I'm using my Rt hand more now to wash dishes, help open containers, etc. I'm Lt handed    Pertinent History  lacunar stroke w/ Rt hemiparesis 04/12/19. PMH: obesity, T2DM, HTN, HLD, ETOH abuse, chronic small vessel ischemic dz    Limitations  driving    Currently in Pain?  No/denies      Quadraped: cat/cow stretch, followed by A/P wt shifts. Progressed to disengaging alternate UE/LE for core strength and scapula strengthening. Standing leaning over counter: sh extension RUE for posterior sh strengthening. Standing, Rt sh maintaining flexion at 90* w/ hand against ball along vertical surface for small circumduction ex's followed by BUE high level sh flexion along ball on wall.  Seated: worked on in Scientist, physiological Rt hand with medium sized pegs (up to 3 at a time) w/ mod difficulty  and drops to copy peg design                       OT Short Term Goals - 05/08/19 1201      OT SHORT TERM GOAL #1   Title  Independent with RUE strengthening HEP    Time  4    Period  Weeks    Status  On-going      OT SHORT TERM GOAL #2   Title  Independent with LE dressing using A/E PRN    Time  4    Period  Weeks    Status  On-going      OT SHORT TERM GOAL #3   Title  Independent with coordination and putty HEP for Rt hand    Time  4    Period  Weeks    Status  Achieved      OT SHORT TERM GOAL #4   Title  Pt to perform 10 min or more of dynamic standing w/o rest or LOB    Time  4    Period  Weeks    Status  On-going      OT SHORT TERM GOAL #5   Title  Improve coordination Rt hand as evidenced by reducing speed on 9 hole peg test to 50 sec.  or less    Baseline  60.66 sec    Time  4    Period  Weeks    Status  New      OT SHORT TERM GOAL #6   Title  Pt to cut food w/ A/E prn safely    Time  4    Period  Weeks    Status  On-going        OT Long Term Goals - 05/02/19 1329      OT LONG TERM GOAL #1   Title  Pt to perform full cooking tasks safely at mod I level w/ only 1 rest break    Time  8    Period  Weeks    Status  New      OT LONG TERM GOAL #2   Title  Pt to perform heavier cleaning tasks safely at mod I level w/ rest breaks prn (taking out trash, vacuuming, etc)    Time  8    Period  Weeks    Status  New      OT LONG TERM GOAL #3   Title  Pt to improve coordination as evidenced by reducing speed on 9 hole peg test to 40 sec. or less Rt hand    Baseline  60.66 sec    Time  8    Period  Weeks    Status  New      OT LONG TERM GOAL #4   Title  Grip strength Rt hand to be 50 lbs or greater    Baseline  35.9 lbs    Time  8    Period  Weeks    Status  New      OT LONG TERM GOAL #5   Title  Pt to demo sufficient strength RUE to retrieve/replace 5 lb object from high shelf w/o pain and min compensations    Time  8    Period   Weeks    Status  New      Long Term Additional Goals   Additional Long Term Goals  Yes      OT LONG TERM GOAL #6   Title  Pt to perform environmental scanning at 90% accuracy w/ simple physical task    Time  8    Period  Weeks    Status  New            Plan - 05/21/19 1148    Clinical Impression Statement  Pt progressing towards goals with RUE function and strength.    Occupational performance deficits (Please refer to evaluation for details):  ADL's;IADL's;Work;Leisure    Body Structure / Function / Physical Skills  ADL;IADL;Balance;Body mechanics;Sensation;Mobility;Strength;Coordination;FMC;UE functional use;Decreased knowledge of use of DME    Rehab Potential  Good    Comorbidities impacting occupational performance description:  obesity    OT Frequency  2x / week    OT Duration  8 weeks    OT Treatment/Interventions  Self-care/ADL training;Therapeutic exercise;Functional Mobility Training;Aquatic Therapy;Neuromuscular education;Manual Therapy;Therapeutic activities;Coping strategies training;DME and/or AE instruction;Passive range of motion;Patient/family education    Plan  Dynamic standing, coordination (in hand manipulation), grip strength    Consulted and Agree with Plan of Care  Patient       Patient will benefit from skilled therapeutic intervention in order to improve the following deficits and impairments:   Body Structure / Function / Physical Skills: ADL, IADL, Balance, Body mechanics, Sensation, Mobility, Strength, Coordination, FMC, UE functional use, Decreased knowledge of use of  DME       Visit Diagnosis: Hemiplegia and hemiparesis following cerebral infarction affecting right non-dominant side (HCC)  Muscle weakness (generalized)  Other lack of coordination    Problem List Patient Active Problem List   Diagnosis Date Noted  . DM (diabetes mellitus), type 2 with peripheral vascular complications (HCC) 04/24/2019  . Lacunar stroke (HCC) 04/12/2019     Kelli Churn, OTR/L 05/21/2019, 12:17 PM  [facility] [address] [place], [place], [address] Phone: [phone]   Fax:  [phone]  Name: Benjamin Rosales. MRN: [id number] Date of Birth: 1955-12-15

## 2019-05-22 ENCOUNTER — Encounter: Payer: Self-pay | Admitting: Family Medicine

## 2019-05-22 ENCOUNTER — Ambulatory Visit (INDEPENDENT_AMBULATORY_CARE_PROVIDER_SITE_OTHER): Payer: BC Managed Care – PPO | Admitting: Family Medicine

## 2019-05-22 VITALS — BP 130/74 | HR 86 | Resp 16 | Ht 67.0 in | Wt 290.1 lb

## 2019-05-22 DIAGNOSIS — R7401 Elevation of levels of liver transaminase levels: Secondary | ICD-10-CM

## 2019-05-22 DIAGNOSIS — G4733 Obstructive sleep apnea (adult) (pediatric): Secondary | ICD-10-CM | POA: Diagnosis not present

## 2019-05-22 DIAGNOSIS — I10 Essential (primary) hypertension: Secondary | ICD-10-CM | POA: Insufficient documentation

## 2019-05-22 DIAGNOSIS — E1151 Type 2 diabetes mellitus with diabetic peripheral angiopathy without gangrene: Secondary | ICD-10-CM | POA: Diagnosis not present

## 2019-05-22 DIAGNOSIS — I6381 Other cerebral infarction due to occlusion or stenosis of small artery: Secondary | ICD-10-CM | POA: Diagnosis not present

## 2019-05-22 NOTE — Patient Instructions (Addendum)
  A few things to remember from today's visit:   DM (diabetes mellitus), type 2 with peripheral vascular complications (HCC)  OSA (obstructive sleep apnea)  Lacunar stroke (HCC)  You are doing great!, lost some wt and recovering from the stroke. Continue following a healthful diet. I will complete FMLA.  I will check your liver next visit as we planned.   Please be sure medication list is accurate. If a new problem present, please set up appointment sooner than planned today.   Referral  enter on [facility]  Sent to work [facility] - office will contact pt to schedule directly  Pulmonologist in [place], [place] Address: [address] [place], [place], [place] [address] Phone: [phone]    Referral uploaded on  proficient  [facility],  Address: [address], [place], [place] [address] Phone: [phone] Fax [phone] Their office will contact pt to schedule directly

## 2019-05-22 NOTE — Progress Notes (Signed)
HPI:   [name] is a 64 y.o. male, who is here today for follow up.   He was last seen on 02/21/2020 for hospital follow-up and establishing care. No new problems since his last visit. S/P CVA, lacunar stoke on 04/09/19, he presented to the ER on 04/12/19. Residual right-sided weakness and some slurred speech.  At the time of his last visit the speech was improving. He is on atorvastatin 40 mg daily and Aspirin 81 mg.  He has an appointment already arranged to see neurologist.  He needs FMLA fill out.  Since his last visit he has been following with physical therapist. He feels like he is getting stronger and coordination is improving. He is not using assistance for transfer. ADLs and IADLs independent. He is not driving at this time and has not returned to work. He is a Product manager, he stands all day.  DM2: Currently he is on Metformin 500 mg twice daily. Tolerating medication well. Fasting BS: From 100-141, 183 x 1, pm.  Lab Results  Component Value Date   HGBA1C 6.8 (H) 04/13/2019   Negative for olydipsia,polyuria, or polyphagia.   His friend is still noticing episodes of apnea while he is asleep but the episodes seem to be less frequent, which she attributes to weight loss. He was referred to pulmonology last visit, he had not received information at this time. He is following dietary recommendations.  Hypertension: Currently he is on amlodipine 10 mg daily, hydralazine 25 mg 3 times daily, and HCTZ 12.5 mg daily. Negative for severe/frequent headache, visual changes, chest pain, dyspnea, palpitation, new focal weakness, or edema.  Lab Results  Component Value Date   CREATININE 0.94 04/14/2019   BUN 11 04/14/2019   NA 138 04/14/2019   K 3.9 04/14/2019   CL 103 04/14/2019   CO2 26 04/14/2019   Last LFT was mildly abnormal, elevated ALT. He has not drunken alcohol for about 3 months. Negative for abdominal pain, jaundice, nausea, or  vomiting  Lab Results  Component Value Date   ALT 52 (H) 04/14/2019   AST 29 04/14/2019   ALKPHOS 58 04/14/2019   BILITOT 1.0 04/14/2019    Review of Systems  Constitutional: Negative for activity change, appetite change, fatigue and fever.  HENT: Negative for nosebleeds, sore throat and trouble swallowing.   Respiratory: Negative for cough and wheezing.   Gastrointestinal:       No changes in bowel habits.  Genitourinary: Negative for decreased urine volume, dysuria and hematuria.  Skin: Negative for rash and wound.  Neurological: Negative for syncope and facial asymmetry.  Psychiatric/Behavioral: Negative for confusion and hallucinations.  Rest of ROS, see pertinent positives sand negatives in HPI  Current Outpatient Medications on File Prior to Visit  Medication Sig Dispense Refill  . amLODipine (NORVASC) 10 MG tablet Take 1 tablet (10 mg total) by mouth daily. 30 tablet 11  . aspirin 81 MG chewable tablet Chew 1 tablet (81 mg total) by mouth daily. 30 tablet 11  . atorvastatin (LIPITOR) 40 MG tablet Take 1 tablet (40 mg total) by mouth daily at 6 PM. 30 tablet 11  . Cyanocobalamin (VITAMIN B12 PO) Take 1 tablet by mouth daily.    . folic acid (FOLVITE) 1 MG tablet TAKE 1 TABLET BY MOUTH EVERY DAY 90 tablet 2  . glucose blood (ONETOUCH VERIO) test strip Use to test 1-2 times daily. 200 each 12  . hydrALAZINE (APRESOLINE) 25 MG tablet Take  1 tablet (25 mg total) by mouth 3 (three) times daily. 90 tablet 5  . hydrochlorothiazide (HYDRODIURIL) 12.5 MG tablet Take 2 tablets (25 mg total) by mouth daily. 180 tablet 1  . metFORMIN (GLUCOPHAGE) 500 MG tablet Take 1 tablet (500 mg total) by mouth 2 (two) times daily with a meal. 60 tablet 11  . Multiple Vitamin (MULTIVITAMIN) tablet Take 1 tablet by mouth daily.    Marland Kitchen thiamine 100 MG tablet Take 1 tablet (100 mg total) by mouth daily. 30 tablet 5   No current facility-administered medications on file prior to visit.   Past Medical  History:  Diagnosis Date  . Diabetes mellitus without complication (HCC)   . Hypercholesteremia   . Hypertension   . Stroke [facility]) 04/2019   No Known Allergies  Social History   Socioeconomic History  . Marital status: Single    Spouse name: Not on file  . Number of children: 0  . Years of education: 58  . Highest education level: Not on file  Occupational History    Comment: NA  Tobacco Use  . Smoking status: Never Smoker  . Smokeless tobacco: Never Used  Substance and Sexual Activity  . Alcohol use: Not Currently    Alcohol/week: 28.0 standard drinks    Types: 28 Shots of liquor per week    Comment: 1 pint of liquor per day, 05/20/19 none  . Drug use: Never  . Sexual activity: Not on file  Other Topics Concern  . Not on file  Social History Narrative   Lives alone   Caffeine- none   Social Determinants of Health   Financial Resource Strain:   . Difficulty of Paying Living Expenses:   Food Insecurity:   . Worried About Programme researcher, broadcasting/film/video in the Last Year:   . Barista in the Last Year:   Transportation Needs:   . Freight forwarder (Medical):   Marland Kitchen Lack of Transportation (Non-Medical):   Physical Activity:   . Days of Exercise per Week:   . Minutes of Exercise per Session:   Stress:   . Feeling of Stress :   Social Connections:   . Frequency of Communication with Friends and Family:   . Frequency of Social Gatherings with Friends and Family:   . Attends Religious Services:   . Active Member of Clubs or Organizations:   . Attends Banker Meetings:   Marland Kitchen Marital Status:    Vitals:   05/22/19 1026  BP: 130/74  Pulse: 86  Resp: 16  SpO2: 97%   Wt Readings from Last 3 Encounters:  05/22/19 290 lb 2 oz (131.6 kg)  05/20/19 290 lb 6.4 oz (131.7 kg)  04/24/19 297 lb (134.7 kg)    Body mass index is 45.44 kg/m.   Physical Exam  Nursing note and vitals reviewed. Constitutional: He is oriented to person, place, and time. He appears  well-developed. No distress.  HENT:  Head: Normocephalic and atraumatic.  Mouth/Throat: Oropharynx is clear and moist and mucous membranes are normal.  Eyes: Pupils are equal, round, and reactive to light. Conjunctivae are normal.  Cardiovascular: Normal rate and regular rhythm.  No murmur heard. Pulses:      Dorsalis pedis pulses are 2+ on the right side and 2+ on the left side.  Respiratory: Effort normal and breath sounds normal. No respiratory distress.  GI: Soft. He exhibits no mass. There is no hepatomegaly. There is no abdominal tenderness. A hernia is present.  Musculoskeletal:        General: Edema (Trace pitting LE edema, bilateral.) present.  Neurological: He is alert and oriented to person, place, and time. He has normal strength. No cranial nerve deficit.  Mildly unstable gait, not assisted.  Skin: Skin is warm. No rash noted. No erythema.  Psychiatric: He has a normal mood and affect. Cognition and memory are normal.  Well groomed, good eye contact.    ASSESSMENT AND PLAN:   Mr. Kwame Ryland. was seen today for follow-up.  No orders of the defined types were placed in this encounter.   DM (diabetes mellitus), type 2 with peripheral vascular complications (HCC) Problem seems to be well controlled based on reported BS. Continue Metformin 500 mg twice daily. Continue following dietary recommendations and engaging in regular physical activity We will plan on checking A1c every 07/2019.  Lacunar stroke ([place]) Recovering well. Continue physical therapy. Adequate BP and glucose control. Continue atorvastatin 40 mg daily and Aspirin 81 mg daily. Keep appointment with neurologist.   Hypertension, essential, benign BP adequately controlled. Continue amlodipine 10 mg daily and HCTZ 25 mg daily. Low-salt diet is also recommended.  Morbid obesity ([place]) She has lost about 7 pounds in the past 3 to 4 weeks. Encouraged to continue following a healthful diet.  Regular physical activity is also recommended. Weight loss will help tremendously with better control BP, glucose, and OSA.  OSA (obstructive sleep apnea) Referral information given, so he can call and ask for appointment status. We reviewed adverse effects of sleep apnea. Congratulated for weight loss.  Elevated ALT measurement Mildly elevated and asymptomatic. He would like to hold on labs today, which I feel is appropriate. We discussed some side effects of statins. We will plan on checking LFTs next visit. Continue avoiding alcohol intake.  In my opinion he will be able to return to work if he has a chair, so he can sit down as needed. We will wait for him to complete PT. Starting date for FMLA/short disability 04/12/2019.  Return in about 12 weeks (around 08/14/2019) for Fasting labs, DM II,HLD.    G. [place], MD  [facility]. [place] office.  A few things to remember from today's visit:   DM (diabetes mellitus), type 2 with peripheral vascular complications ([place])  OSA (obstructive sleep apnea)  Lacunar stroke ([place])  You are doing great!, lost some wt and recovering from the stroke. Continue following a healthful diet. I will complete FMLA.  I will check your liver next visit as we planned.   Please be sure medication list is accurate. If a new problem present, please set up appointment sooner than planned today.

## 2019-05-22 NOTE — Assessment & Plan Note (Signed)
She has lost about 7 pounds in the past 3 to 4 weeks. Encouraged to continue following a healthful diet. Regular physical activity is also recommended. Weight loss will help tremendously with better control BP, glucose, and OSA.

## 2019-05-22 NOTE — Assessment & Plan Note (Signed)
Problem seems to be well controlled based on reported BS. Continue Metformin 500 mg twice daily. Continue following dietary recommendations and engaging in regular physical activity We will plan on checking A1c every 07/2019.

## 2019-05-22 NOTE — Assessment & Plan Note (Addendum)
Recovering well. Continue physical therapy. Adequate BP and glucose control. Continue atorvastatin 40 mg daily and Aspirin 81 mg daily. Keep appointment with neurologist.

## 2019-05-22 NOTE — Assessment & Plan Note (Signed)
BP adequately controlled. Continue amlodipine 10 mg daily and HCTZ 25 mg daily. Low-salt diet is also recommended.

## 2019-05-24 ENCOUNTER — Encounter: Payer: Self-pay | Admitting: Physical Therapy

## 2019-05-24 ENCOUNTER — Encounter: Payer: Self-pay | Admitting: Occupational Therapy

## 2019-05-24 ENCOUNTER — Other Ambulatory Visit: Payer: Self-pay

## 2019-05-24 ENCOUNTER — Ambulatory Visit: Payer: BC Managed Care – PPO | Admitting: Physical Therapy

## 2019-05-24 ENCOUNTER — Ambulatory Visit: Payer: BC Managed Care – PPO | Admitting: Occupational Therapy

## 2019-05-24 DIAGNOSIS — R2689 Other abnormalities of gait and mobility: Secondary | ICD-10-CM

## 2019-05-24 DIAGNOSIS — M6281 Muscle weakness (generalized): Secondary | ICD-10-CM

## 2019-05-24 DIAGNOSIS — I69353 Hemiplegia and hemiparesis following cerebral infarction affecting right non-dominant side: Secondary | ICD-10-CM

## 2019-05-24 DIAGNOSIS — R2681 Unsteadiness on feet: Secondary | ICD-10-CM

## 2019-05-24 DIAGNOSIS — R208 Other disturbances of skin sensation: Secondary | ICD-10-CM

## 2019-05-24 DIAGNOSIS — R278 Other lack of coordination: Secondary | ICD-10-CM

## 2019-05-24 NOTE — Therapy (Signed)
[facility] [address] [place], [place], [address] Phone: [phone]   Fax:  [phone]  Occupational Therapy Treatment  Patient Details  Name: Benjamin Rosales. MRN: [id number] Date of Birth: Nov 21, 1955 No data recorded  Encounter Date: 05/24/2019  OT End of Session - 05/24/19 1151    Visit Number  6    Number of Visits  17    Date for OT Re-Evaluation  07/02/19    Authorization Type  BC/BS [employer] - 20 VL each discipline    Authorization - Visit Number  6    Authorization - Number of Visits  20    OT Start Time  5885    OT Stop Time  1230    OT Time Calculation (min)  43 min    Activity Tolerance  Patient tolerated treatment well    Behavior During Therapy  WFL for tasks assessed/performed       Past Medical History:  Diagnosis Date  . Diabetes mellitus without complication ([place])   . Hypercholesteremia   . Hypertension   . Stroke [facility]) 04/2019    Past Surgical History:  Procedure Laterality Date  . TONSILLECTOMY      There were no vitals filed for this visit.  Subjective Assessment - 05/24/19 1146    Subjective   I have more control of my arm now, still some pins and needles    Pertinent History  lacunar stroke w/ Rt hemiparesis 04/12/19. PMH: obesity, T2DM, HTN, HLD, ETOH abuse, chronic small vessel ischemic dz    Limitations  driving    Currently in Pain?  Yes    Pain Score  1     Pain Location  Knee    Pain Orientation  Right;Medial    Pain Descriptors / Indicators  Dull;Sharp    Pain Type  Chronic pain    Pain Frequency  Intermittent    Aggravating Factors   bending of knee in wt. bearing    Pain Relieving Factors  "warming up"        Picking up blocks using gripper on level 3 (black spring) for sustained grip strength with min-mod difficulty.   Picking up 5 coins in R hand and manipulate in hand to place in coin bank 1 at a time for in-hand manipulation.  Placing small pegs in  pegboard to copy design with min difficulty for coordination, good accuracy copying and removing with each finger/thumb alternating for incr coordination, min difficulty.  Shoulder flex AAROM BUEs with ball along wall with min cueing for positioning and then with shoulder flex off ball, alternating at end range for incr scapular stability and depression with min cueing/difficulty.    Standing, performing functional reaching to place/remove clothespins with 1-8lb resistance on vertical pole requiring trunk rotation and wt shift to reach outside base of support.       OT Short Term Goals - 05/08/19 1201      OT SHORT TERM GOAL #1   Title  Independent with RUE strengthening HEP    Time  4    Period  Weeks    Status  On-going      OT SHORT TERM GOAL #2   Title  Independent with LE dressing using A/E PRN    Time  4    Period  Weeks    Status  On-going      OT SHORT TERM GOAL #3   Title  Independent with coordination and putty HEP for Rt hand  Time  4    Period  Weeks    Status  Achieved      OT SHORT TERM GOAL #4   Title  Pt to perform 10 min or more of dynamic standing w/o rest or LOB    Time  4    Period  Weeks    Status  On-going      OT SHORT TERM GOAL #5   Title  Improve coordination Rt hand as evidenced by reducing speed on 9 hole peg test to 50 sec. or less    Baseline  60.66 sec    Time  4    Period  Weeks    Status  New      OT SHORT TERM GOAL #6   Title  Pt to cut food w/ A/E prn safely    Time  4    Period  Weeks    Status  On-going        OT Long Term Goals - 05/02/19 1329      OT LONG TERM GOAL #1   Title  Pt to perform full cooking tasks safely at mod I level w/ only 1 rest break    Time  8    Period  Weeks    Status  New      OT LONG TERM GOAL #2   Title  Pt to perform heavier cleaning tasks safely at mod I level w/ rest breaks prn (taking out trash, vacuuming, etc)    Time  8    Period  Weeks    Status  New      OT LONG TERM GOAL #3    Title  Pt to improve coordination as evidenced by reducing speed on 9 hole peg test to 40 sec. or less Rt hand    Baseline  60.66 sec    Time  8    Period  Weeks    Status  New      OT LONG TERM GOAL #4   Title  Grip strength Rt hand to be 50 lbs or greater    Baseline  35.9 lbs    Time  8    Period  Weeks    Status  New      OT LONG TERM GOAL #5   Title  Pt to demo sufficient strength RUE to retrieve/replace 5 lb object from high shelf w/o pain and min compensations    Time  8    Period  Weeks    Status  New      Long Term Additional Goals   Additional Long Term Goals  Yes      OT LONG TERM GOAL #6   Title  Pt to perform environmental scanning at 90% accuracy w/ simple physical task    Time  8    Period  Weeks    Status  New            Plan - 05/24/19 1151    Clinical Impression Statement  Pt progressing towards goals with RUE function and strength and control.    Occupational performance deficits (Please refer to evaluation for details):  ADL's;IADL's;Work;Leisure    Body Structure / Function / Physical Skills  ADL;IADL;Balance;Body mechanics;Sensation;Mobility;Strength;Coordination;FMC;UE functional use;Decreased knowledge of use of DME    Rehab Potential  Good    Comorbidities impacting occupational performance description:  obesity    OT Frequency  2x / week    OT Duration  8 weeks    OT  Treatment/Interventions  Self-care/ADL training;Therapeutic exercise;Functional Mobility Training;Aquatic Therapy;Neuromuscular education;Manual Therapy;Therapeutic activities;Coping strategies training;DME and/or AE instruction;Passive range of motion;Patient/family education    Plan  continue with Dynamic standing, coordination, strength and shoulder stability    Consulted and Agree with Plan of Care  Patient       Patient will benefit from skilled therapeutic intervention in order to improve the following deficits and impairments:   Body Structure / Function / Physical  Skills: ADL, IADL, Balance, Body mechanics, Sensation, Mobility, Strength, Coordination, FMC, UE functional use, Decreased knowledge of use of DME       Visit Diagnosis: 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 Hypertension, essential, benign 05/22/2019  . Morbid obesity (HCC) 05/22/2019  . DM (diabetes mellitus), type 2 with peripheral vascular complications (HCC) 04/24/2019  . Lacunar stroke [facility]) 04/12/2019    [facility] 05/24/2019, 11:52 AM  [facility] [address] [place], [place], [address] Phone: [phone]   Fax:  [phone]  Name: Benjamin Rosales. MRN: [id number] Date of Birth: 03-Feb-1956   Willa Frater, OTR/L [facility] [address] [place], [place]  [address] [phone] phone [phone] 05/24/19 11:54 AM

## 2019-05-24 NOTE — Therapy (Signed)
[facility] [address] [place], [place], [address] Phone: [phone]   Fax:  [phone]  Physical Therapy Treatment  Patient Details  Name: Benjamin Rosales. MRN: [id number] Date of Birth: 01/24/1956 Referring Provider (PT): Referred by Pecola Leisure but will be following up with Dr. Leta Baptist   Encounter Date: 05/24/2019  PT End of Session - 05/24/19 1105    Visit Number  6    Number of Visits  17    Date for PT Re-Evaluation  07/01/19    Authorization Type  FOTO,   BCBS 20 PT visits but additional visits can be requested by patient through their nurse care coordinator    PT Start Time  1101    PT Stop Time  1142    PT Time Calculation (min)  41 min    Equipment Utilized During Treatment  Gait belt    Activity Tolerance  Patient tolerated treatment well    Behavior During Therapy  [facility] for tasks assessed/performed       Past Medical History:  Diagnosis Date  . Diabetes mellitus without complication ([place])   . Hypercholesteremia   . Hypertension   . Stroke [facility]) 04/2019    Past Surgical History:  Procedure Laterality Date  . TONSILLECTOMY      There were no vitals filed for this visit.  Subjective Assessment - 05/24/19 1103    Subjective  No new compaints. No falls to report. Knee was a little sore after last session, better today with 2/10 as pain rating.    Pertinent History  Symptoms started 3 days prior to going to hospital on 04/12/19 with right side weakness, slurred speech. Found to have acute lacunar infarct. Likely new onset DM.PMH: morbid obesity, HTN, alcholol abuse, hyperlipidemia    Patient Stated Goals  Pt would like to improve strength on right side and improve stamina.    Currently in Pain?  Yes    Pain Score  2     Pain Location  Knee    Pain Orientation  Right;Medial    Pain Descriptors / Indicators  Dull;Sharp    Pain Type  Chronic pain    Pain Onset  More than a month ago     Pain Frequency  Intermittent    Aggravating Factors   bending the knee in weight bearing             [facility] Adult PT Treatment/Exercise - 05/24/19 1106      Transfers   Transfers  Sit to Stand;Stand to Sit    Sit to Stand  5: Supervision;With upper extremity assist;From chair/3-in-1    Stand to Sit  5: Supervision;With upper extremity assist;To chair/3-in-1      Ambulation/Gait   Ambulation/Gait  Yes    Ambulation/Gait Assistance  5: Supervision;4: Min guard    Ambulation/Gait Assistance Details  gait around track working on scanning enviroment all directions randomly and on speed changes with min guard assist, minor veering noted and decreased gait speed with scanning.     Assistive device  None    Gait Pattern  Step-through pattern;Decreased stance time - right;Wide base of support;Antalgic;Decreased stride length;Shuffle   shuffles steps at times   Ambulation Surface  Level;Indoor      Knee/Hip Exercises: Aerobic   Other Aerobic  Scifit LE's, some UE's level 3.0 for 6 minutes with goal rpm 40-45 for strengthening and activity tolerance          Balance Exercises - 05/24/19  1130      Balance Exercises: Standing   Standing Eyes Closed  Narrow base of support (BOS);Wide (BOA);Head turns;Foam/compliant surface;Other reps (comment);30 secs;Limitations    Standing Eyes Closed Limitations  on airex with feet hip width apart, no UE support: EC no head movements, progressing to EC head movements left<>right, up<>down and diagonals both ways for 10 reps each. min guard to min assist for balance, cues on posture and weight shifting to assist with balance.      Balance Beam  standing across blue foam beam: alternating fwd stepping to floor/back onto beam, then alternating bwd stepping to floor/back onto beam for 10 reps each, light UE support on bars with min guard assist. cues for increased step length to clear the beam surface.    Tandem Gait  Forward;Retro;Intermittent upper  extremity support;3 reps;Limitations    Tandem Gait Limitations  on blue foam beam for 3 laps each way with light UE support on bars, cues for form/technique.     Sidestepping  Foam/compliant support;3 reps;Limitations    Sidestepping Limitations  on blue foam beam with light UE support with cues on posture and to lift foot each time/not slide it, min guard assist.           PT Short Term Goals - 05/08/19 0803      PT SHORT TERM GOAL #1   Title  Pt will be independent with initial HEP for strengthening and balance to continue gains on own.    Time  4    Period  Weeks    Status  On-going    Target Date  06/02/19      PT SHORT TERM GOAL #2   Title  Pt's balance/gait will be further assessed with FGA testing and LTG written.    Baseline  05/08/19: met in session today with score of 11/30. Primary PT to set goals.    Status  Achieved    Target Date  06/02/19      PT SHORT TERM GOAL #3   Title  6 min walk test will be performed to further assess gait and activity tolerance to get baseline for LTG.    Baseline  05/08/19: met in session today with distance of 735 feet no AD, one brief standing rest break. Primary PT to set goals.    Status  Achieved    Target Date  06/02/19      PT SHORT TERM GOAL #4   Title  Pt will increase 30 sec sit to stand from 11 reps to 13 or more reps from standard chair for improved functional strength.    Baseline  11 reps on 05/02/19    Time  4    Period  Weeks    Status  On-going    Target Date  06/02/19      PT SHORT TERM GOAL #5   Title  Pt will report <6/10 pain in right knee with activities for improved function.    Time  4    Period  Weeks    Status  On-going    Target Date  06/02/19        PT Long Term Goals - 05/17/19 1056      PT LONG TERM GOAL #1   Title  Pt will be independent with progressive HEP for strength, balance and functional mobility to continue gains on own.    Time  8    Period  Weeks    Status  New  PT LONG TERM  GOAL #2   Title  Pt will increase gait speed from 0.51ms to >0.758m for improved gait safety in community.    Baseline  0.5622mon 05/02/19    Time  8    Period  Weeks    Status  New      PT LONG TERM GOAL #3   Title  Pt will decrease TUG from 18.88 sec to <15 sec for improved balance and functional strength.    Baseline  18.88sec on 05/02/19    Time  8    Period  Weeks    Status  New      PT LONG TERM GOAL #4   Title  Pt will ambulate >500' on varied surfaces independently for safe community distances.    Time  8    Period  Weeks    Status  New      PT LONG TERM GOAL #5   Title  Pt will increase 6 min walk distance from 735' to 935' for improved gait and activity tolerance.    Baseline  735' on 05/08/19 with 1 standing rest break    Time  8    Period  Weeks    Status  Revised      PT LONG TERM GOAL #6   Title  Pt will ambulate up/down 4 steps without railing independently to safely enter/exit home.    Time  8    Period  Weeks    Status  New      PT LONG TERM GOAL #7   Title  Pt will increase FGA from 11/30 to 16/30 or more for improved balance and gait.    Baseline  11/30 on 05/08/19    Time  8    Period  Weeks    Status  New    Target Date  07/02/19            Plan - 05/24/19 1106    Clinical Impression Statement  Today's skilled session continued to focus on strengthening and balance with rest breaks taken as needed due to fatigue. The pt is progressing toward goals and should benefit from continued PT to progress toward unmet goals.    Personal Factors and Comorbidities  Comorbidity 3+;Social Background    Comorbidities  morbid obesity, HTN, alcholol abuse, hyperlipidemia    Examination-Activity Limitations  Locomotion Level;Stand;Stairs    Examination-Participation Restrictions  Yard Work;Community Activity   job   Stability/Clinical Decision Making  Evolving/Moderate complexity    Rehab Potential  Good    PT Frequency  2x / week    PT Duration  8 weeks     PT Treatment/Interventions  ADLs/Self Care Home Management;Cryotherapy;Moist Heat;Therapeutic exercise;Therapeutic activities;Functional mobility training;Stair training;Gait training;DME Instruction;Balance training;Neuromuscular re-education;Patient/family education;Manual techniques;Passive range of motion;Energy conservation    PT Next Visit Plan  continue to address LE strengthening in session, balance reactions on floor progressing to on complaint surfaces, aerobic activities. Monitor BP as was a little elevated at end of session.    PT Home Exercise Plan  Access Code: [email or web address] Consulted and Agree with Plan of Care  Patient;Other (Comment)   girlfriend      Patient will benefit from skilled therapeutic intervention in order to improve the following deficits and impairments:  Abnormal gait, Cardiopulmonary status limiting activity, Decreased activity tolerance, Decreased balance, Decreased endurance, Decreased coordination, Decreased strength, Pain  Visit Diagnosis: Other abnormalities of gait and mobility  Muscle weakness (generalized)  Problem List Patient Active Problem List   Diagnosis Date Noted  . Hypertension, essential, benign 05/22/2019  . Morbid obesity ([place]) 05/22/2019  . DM (diabetes mellitus), type 2 with peripheral vascular complications ([place]) 01/00/7121  . Lacunar stroke ([place]) 04/12/2019    Willow Ora, PTA, [facility] [address] [place], [place] [address] [phone] 05/24/19, 12:15 PM   Name: Benjamin Rosales. MRN: [id number] Date of Birth: [birth year]

## 2019-05-27 ENCOUNTER — Other Ambulatory Visit: Payer: Self-pay

## 2019-05-27 ENCOUNTER — Ambulatory Visit: Payer: BC Managed Care – PPO | Admitting: Occupational Therapy

## 2019-05-27 ENCOUNTER — Ambulatory Visit: Payer: BC Managed Care – PPO

## 2019-05-27 VITALS — BP 163/86

## 2019-05-27 DIAGNOSIS — I69353 Hemiplegia and hemiparesis following cerebral infarction affecting right non-dominant side: Secondary | ICD-10-CM

## 2019-05-27 DIAGNOSIS — R278 Other lack of coordination: Secondary | ICD-10-CM

## 2019-05-27 DIAGNOSIS — R2689 Other abnormalities of gait and mobility: Secondary | ICD-10-CM | POA: Diagnosis not present

## 2019-05-27 DIAGNOSIS — M6281 Muscle weakness (generalized): Secondary | ICD-10-CM

## 2019-05-27 NOTE — Therapy (Signed)
[facility] [address] [place], [place], [address] Phone: [phone]   Fax:  [phone]  Occupational Therapy Treatment  Patient Details  Name: Benjamin Rosales. MRN: [id number] Date of Birth: Jan 21, 1956 No data recorded  Encounter Date: 05/27/2019  OT End of Session - 05/27/19 0717    Visit Number  7    Number of Visits  17    Date for OT Re-Evaluation  07/02/19    Authorization Type  BC/BS [employer] - 20 VL each discipline    Authorization - Visit Number  7    Authorization - Number of Visits  20    OT Start Time  [phone]    OT Stop Time  0757    OT Time Calculation (min)  40 min    Activity Tolerance  Patient tolerated treatment well    Behavior During Therapy  [facility] for tasks assessed/performed       Past Medical History:  Diagnosis Date  . Diabetes mellitus without complication ([place])   . Hypercholesteremia   . Hypertension   . Stroke [facility]) 04/2019    Past Surgical History:  Procedure Laterality Date  . TONSILLECTOMY      There were no vitals filed for this visit.  Subjective Assessment - 05/27/19 0717    Subjective   Pt reports he was quite active this weekend    Pertinent History  lacunar stroke w/ Rt hemiparesis 04/12/19. PMH: obesity, T2DM, HTN, HLD, ETOH abuse, chronic small vessel ischemic dz    Limitations  driving    Currently in Pain?  No/denies             Treatment: Standing to copy small peg design with RUE for standing balance and fine motor coordination, min-mod difficulty/ drops,    removing pegs with in hand manipulation, mod difficulty, v.c to watch hand due to impaired sensation.  Gripper set at level 3 to pick up 1 inch blocks, min-mod difficulty / v.c for hand placement  Arm bike x 6 mins level 3 for conditioning.  Pt reports burning food on stove the other day because he got distracted, therapist encouraged pt to stay with his food when  cooking.               OT Short Term Goals - 05/08/19 1201      OT SHORT TERM GOAL #1   Title  Independent with RUE strengthening HEP    Time  4    Period  Weeks    Status  On-going      OT SHORT TERM GOAL #2   Title  Independent with LE dressing using A/E PRN    Time  4    Period  Weeks    Status  On-going      OT SHORT TERM GOAL #3   Title  Independent with coordination and putty HEP for Rt hand    Time  4    Period  Weeks    Status  Achieved      OT SHORT TERM GOAL #4   Title  Pt to perform 10 min or more of dynamic standing w/o rest or LOB    Time  4    Period  Weeks    Status  On-going      OT SHORT TERM GOAL #5   Title  Improve coordination Rt hand as evidenced by reducing speed on 9 hole peg test to 50 sec. or less    Baseline  60.66 sec    Time  4    Period  Weeks    Status  New      OT SHORT TERM GOAL #6   Title  Pt to cut food w/ A/E prn safely    Time  4    Period  Weeks    Status  On-going        OT Long Term Goals - 05/02/19 1329      OT LONG TERM GOAL #1   Title  Pt to perform full cooking tasks safely at mod I level w/ only 1 rest break    Time  8    Period  Weeks    Status  New      OT LONG TERM GOAL #2   Title  Pt to perform heavier cleaning tasks safely at mod I level w/ rest breaks prn (taking out trash, vacuuming, etc)    Time  8    Period  Weeks    Status  New      OT LONG TERM GOAL #3   Title  Pt to improve coordination as evidenced by reducing speed on 9 hole peg test to 40 sec. or less Rt hand    Baseline  60.66 sec    Time  8    Period  Weeks    Status  New      OT LONG TERM GOAL #4   Title  Grip strength Rt hand to be 50 lbs or greater    Baseline  35.9 lbs    Time  8    Period  Weeks    Status  New      OT LONG TERM GOAL #5   Title  Pt to demo sufficient strength RUE to retrieve/replace 5 lb object from high shelf w/o pain and min compensations    Time  8    Period  Weeks    Status  New      Long  Term Additional Goals   Additional Long Term Goals  Yes      OT LONG TERM GOAL #6   Title  Pt to perform environmental scanning at 90% accuracy w/ simple physical task    Time  8    Period  Weeks    Status  New              Patient will benefit from skilled therapeutic intervention in order to improve the following deficits and impairments:           Visit Diagnosis: Hemiplegia and hemiparesis following cerebral infarction affecting right non-dominant side (HCC)  Muscle weakness (generalized)  Other lack of coordination    Problem List Patient Active Problem List   Diagnosis Date Noted  . Hypertension, essential, benign 05/22/2019  . Morbid obesity (HCC) 05/22/2019  . DM (diabetes mellitus), type 2 with peripheral vascular complications (HCC) 04/24/2019  . Lacunar stroke (HCC) 04/12/2019    Liara Holm 05/27/2019, 7:47 AM  [facility] [address] [place], [place], [address] Phone: [phone]   Fax:  [phone]  Name: Benjamin Rosales. MRN: [id number] Date of Birth: 06-01-1955

## 2019-05-27 NOTE — Therapy (Signed)
[facility] [address] [place], [place], [address] Phone: [phone]   Fax:  [phone]  Physical Therapy Treatment  Patient Details  Name: Benjamin Rosales. MRN: [id number] Date of Birth: 02/12/1956 Referring Provider (PT): Referred by Pecola Leisure but will be following up with Dr. Leta Baptist   Encounter Date: 05/27/2019  PT End of Session - 05/27/19 0847    Visit Number  7    Number of Visits  17    Date for PT Re-Evaluation  07/01/19    Authorization Type  FOTO,   BCBS 20 PT visits but additional visits can be requested by patient through their nurse care coordinator    PT Start Time  [phone]    PT Stop Time  0930    PT Time Calculation (min)  44 min    Equipment Utilized During Treatment  Gait belt    Activity Tolerance  Patient tolerated treatment well    Behavior During Therapy  [facility] for tasks assessed/performed       Past Medical History:  Diagnosis Date  . Diabetes mellitus without complication ([place])   . Hypercholesteremia   . Hypertension   . Stroke [facility]) 04/2019    Past Surgical History:  Procedure Laterality Date  . TONSILLECTOMY      There were no vitals filed for this visit.  Subjective Assessment - 05/27/19 0848    Subjective  No complaints today, feels well. No falls since last session. No knee pain today. Pt reporting he was able to take a shower this morning.    Pertinent History  Symptoms started 3 days prior to going to hospital on 04/12/19 with right side weakness, slurred speech. Found to have acute lacunar infarct. Likely new onset DM.PMH: morbid obesity, HTN, alcholol abuse, hyperlipidemia    Patient Stated Goals  Pt would like to improve strength on right side and improve stamina.    Currently in Pain?  No/denies    Pain Onset  More than a month ago                       [facility] Adult PT Treatment/Exercise - 05/27/19 0001      Transfers   Transfers  Sit to  Stand    Sit to Stand  5: Supervision    Sit to Stand Details (indicate cue type and reason)  Completed 30 second sit to stand, pt completing 10 reps without UE support.       Ambulation/Gait   Ambulation/Gait  Yes    Ambulation/Gait Assistance  5: Supervision    Ambulation/Gait Assistance Details  gait around gym, demo slow cadence with wide base of support    Ambulation Distance (Feet)  230 Feet    Assistive device  None    Ambulation Surface  Level;Indoor      Exercises   Other Exercises   Completed standing balance activites including FT on firm surface 2 x 1 min, demoing improved balance. Progressed to FT on foam, 2 x1 min. increased A/P sway, requiring UE support at times from // bar, completed 2 x 1 minute each. Completed tandem stance 2 x 1 minute, alternating leading leg, increased sway and instability noted. Standing on foam completed alternating toe taps to cones with focus on improved balance and control with R knee.       Knee/Hip Exercises: Standing   Other Standing Knee Exercises  Mini Squats on foam in // bars, w/ light support, 2  x10 reps. Completed side stepping x 4 in // bars with red tband.                PT Short Term Goals - 05/27/19 0854      PT SHORT TERM GOAL #1   Title  Pt will be independent with initial HEP for strengthening and balance to continue gains on own.    Time  4    Period  Weeks    Status  Achieved    Target Date  06/02/19      PT SHORT TERM GOAL #2   Title  Pt's balance/gait will be further assessed with FGA testing and LTG written.    Baseline  05/08/19: met in session today with score of 11/30. Primary PT to set goals.    Status  Achieved    Target Date  06/02/19      PT SHORT TERM GOAL #3   Title  6 min walk test will be performed to further assess gait and activity tolerance to get baseline for LTG.    Baseline  05/08/19: met in session today with distance of 735 feet no AD, one brief standing rest break. Primary PT to set goals.     Status  Achieved    Target Date  06/02/19      PT SHORT TERM GOAL #4   Title  Pt will increase 30 sec sit to stand from 11 reps to 13 or more reps from standard chair for improved functional strength.    Baseline  10 reps on 3/29, w/o use of UE.    Time  4    Period  Weeks    Status  On-going    Target Date  06/02/19      PT SHORT TERM GOAL #5   Title  Pt will report <6/10 pain in right knee with activities for improved function.    Time  4    Period  Weeks    Status  Achieved    Target Date  06/02/19        PT Long Term Goals - 05/27/19 0857      PT LONG TERM GOAL #1   Title  Pt will be independent with progressive HEP for strength, balance and functional mobility to continue gains on own.    Time  8    Period  Weeks    Status  New      PT LONG TERM GOAL #2   Title  Pt will increase gait speed from 0.91ms to >0.766m for improved gait safety in community.    Baseline  0.5647mon 05/02/19    Time  8    Period  Weeks    Status  New      PT LONG TERM GOAL #3   Title  Pt will decrease TUG from 18.88 sec to <15 sec for improved balance and functional strength.    Baseline  18.88sec on 05/02/19    Time  8    Period  Weeks    Status  New      PT LONG TERM GOAL #4   Title  Pt will ambulate >500' on varied surfaces independently for safe community distances.    Time  8    Period  Weeks    Status  New      PT LONG TERM GOAL #5   Title  Pt will increase 6 min walk distance from 735' to 935' for improved gait and activity tolerance.  Baseline  735' on 05/08/19 with 1 standing rest break    Time  8    Period  Weeks    Status  Revised      PT LONG TERM GOAL #6   Title  Pt will ambulate up/down 4 steps without railing independently to safely enter/exit home.    Time  8    Period  Weeks    Status  New      PT LONG TERM GOAL #7   Title  Pt will increase FGA from 11/30 to 16/30 or more for improved balance and gait.    Baseline  11/30 on 05/08/19    Time  8    Period   Weeks    Status  New            Plan - 05/27/19 9741    Clinical Impression Statement  Today's session focused on a progression of strengthening and balance activities. Pt demoing improved balance with certain positons, however still demo increased instability on uneven surfaces. Patient making progress toward goals, pt completed 11 reps with UE support 30 sec sit to stand prior and was able to complete 10 reps without UE support today. Demonstrating improvement in LE strength with less UE assistance required.  Pt will benefit from skilled therapy to further improve balance and strength and progress toward goals.    Personal Factors and Comorbidities  Comorbidity 3+;Social Background    Comorbidities  morbid obesity, HTN, alcholol abuse, hyperlipidemia    Examination-Activity Limitations  Locomotion Level;Stand;Stairs    Examination-Participation Restrictions  Yard Work;Community Activity   job   Stability/Clinical Decision Making  Evolving/Moderate complexity    Rehab Potential  Good    PT Frequency  2x / week    PT Duration  8 weeks    PT Treatment/Interventions  ADLs/Self Care Home Management;Cryotherapy;Moist Heat;Therapeutic exercise;Therapeutic activities;Functional mobility training;Stair training;Gait training;DME Instruction;Balance training;Neuromuscular re-education;Patient/family education;Manual techniques;Passive range of motion;Energy conservation    PT Next Visit Plan  continue to address LE strengthening in session, balance reactions on floor progressing to on complaint surfaces, aerobic activities. Monitor BP as was a little elevated at end of session.    PT Home Exercise Plan  Access Code: [email or web address]    Consulted and Agree with Plan of Care  Patient;Other (Comment)   girlfriend      Patient will benefit from skilled therapeutic intervention in order to improve the following deficits and impairments:  Abnormal gait, Cardiopulmonary status limiting activity, Decreased  activity tolerance, Decreased balance, Decreased endurance, Decreased coordination, Decreased strength, Pain  Visit Diagnosis: Hemiplegia and hemiparesis following cerebral infarction affecting right non-dominant side (HCC)  Muscle weakness (generalized)     Problem List Patient Active Problem List   Diagnosis Date Noted  . Hypertension, essential, benign 05/22/2019  . Morbid obesity ([place]) 05/22/2019  . DM (diabetes mellitus), type 2 with peripheral vascular complications ([place]) 68/04/2120  . Lacunar stroke ([place]) 04/12/2019   Treatment provided and documented by Guillermina City.  Gu[place]ina [place], PT, DPT  Electa Sniff, PT, DPT, NCS 05/27/2019, 9:53 AM  [facility] [address] [place], [place], [address] Phone: [phone]   Fax:  [phone]  Name: Benjamin Rosales. MRN: [id number] Date of Birth: 19-Nov-1955

## 2019-06-03 ENCOUNTER — Other Ambulatory Visit: Payer: Self-pay

## 2019-06-03 ENCOUNTER — Encounter: Payer: Self-pay | Admitting: Family Medicine

## 2019-06-03 ENCOUNTER — Ambulatory Visit (INDEPENDENT_AMBULATORY_CARE_PROVIDER_SITE_OTHER): Payer: BC Managed Care – PPO | Admitting: Family Medicine

## 2019-06-03 DIAGNOSIS — M25561 Pain in right knee: Secondary | ICD-10-CM | POA: Diagnosis not present

## 2019-06-03 NOTE — Progress Notes (Signed)
Office Visit Note   Patient: Benjamin Rosales.           Date of Birth: [birth year]           MRN: [id number] Visit Date: 06/03/2019 Requested by: Martinique, Betty G, MD [address] [place],  [place] [address] PCP: Martinique, Betty G, MD  Subjective: Chief Complaint  Patient presents with  . Right Knee - Pain    Sharp pain in the medial aspect of knee and around the patella, with weightbearing activities since early February. Has had problems with the knee x 10 years. Recent history of stroke, involving right side 04/12/19.    HPI: He is here with right knee pain.  He has had intermittent problems with his knee for about 10 years, but in February he had a stroke affecting his right side.  Has been doing rehab since then and he started noticing increasing pain on the medial side of his knee when going up and down steps.  Sometimes it feels like a skin to give out but it has not.  It pops intermittently but does not lock.  He does not take anything for the pain because it is usually very brief.  He is not on anticoagulants related to his stroke.               ROS:   All other systems were reviewed and are negative.  Objective: Vital Signs: There were no vitals taken for this visit.  Physical Exam:  General:  Alert and oriented, in no acute distress. Pulm:  Breathing unlabored. Psy:  Normal mood, congruent affect. Skin: No erythema Right knee: 1+ effusion with no warmth.  He is tender on the medial joint line and has pain but no palpable click with McMurray's.  Full active extension, flexion of 120 degrees.  Imaging: None today  Assessment & Plan: 1.  Right knee pain with effusion, suspect osteoarthritis with possible degenerative medial meniscus tear. -Discussed options with him, since he is trying to progress with his rehab, elected to inject with cortisone to try to get quicker relief. -He will also start taking glucosamine and turmeric. -If symptoms persist, then  x-rays and possibly MRI scan.     Procedures: Right knee steroid injection: After sterile prep with Betadine, injected 5 cc 1% lidocaine without epinephrine and 40 mg methylprednisolone from superolateral approach, a flash of clear yellow synovial fluid was obtained prior to injection.    PMFS History: Patient Active Problem List   Diagnosis Date Noted  . Hypertension, essential, benign 05/22/2019  . Morbid obesity ([place]) 05/22/2019  . DM (diabetes mellitus), type 2 with peripheral vascular complications ([place]) 29/92/4268  . Lacunar stroke ([place]) 04/12/2019   Past Medical History:  Diagnosis Date  . Diabetes mellitus without complication ([place])   . Hypercholesteremia   . Hypertension   . Stroke [facility]) 04/2019    Family History  Problem Relation Age of Onset  . High blood pressure Father   . Heart disease Father   . High blood pressure Brother   . Diabetes Brother     Past Surgical History:  Procedure Laterality Date  . TONSILLECTOMY     Social History   Occupational History    Comment: NA  Tobacco Use  . Smoking status: Never Smoker  . Smokeless tobacco: Never Used  Substance and Sexual Activity  . Alcohol use: Not Currently    Alcohol/week: 28.0 standard drinks    Types: 28 Shots of liquor per  week    Comment: 1 pint of liquor per day, 05/20/19 none  . Drug use: Never  . Sexual activity: Not on file

## 2019-06-03 NOTE — Patient Instructions (Signed)
   Glucosamine Sulfate:  1,000 mg twice daily  Turmeric:  500 mg twice daily   

## 2019-06-04 ENCOUNTER — Encounter: Payer: Self-pay | Admitting: Physical Therapy

## 2019-06-04 ENCOUNTER — Ambulatory Visit: Payer: BC Managed Care – PPO | Admitting: Occupational Therapy

## 2019-06-04 ENCOUNTER — Ambulatory Visit: Payer: BC Managed Care – PPO | Attending: Family Medicine | Admitting: Physical Therapy

## 2019-06-04 VITALS — BP 158/82

## 2019-06-04 DIAGNOSIS — I69353 Hemiplegia and hemiparesis following cerebral infarction affecting right non-dominant side: Secondary | ICD-10-CM

## 2019-06-04 DIAGNOSIS — M6281 Muscle weakness (generalized): Secondary | ICD-10-CM | POA: Diagnosis not present

## 2019-06-04 DIAGNOSIS — R2689 Other abnormalities of gait and mobility: Secondary | ICD-10-CM | POA: Insufficient documentation

## 2019-06-04 DIAGNOSIS — R278 Other lack of coordination: Secondary | ICD-10-CM | POA: Diagnosis present

## 2019-06-04 DIAGNOSIS — R2681 Unsteadiness on feet: Secondary | ICD-10-CM | POA: Insufficient documentation

## 2019-06-04 NOTE — Patient Instructions (Signed)
  Strengthening: Resisted Flexion   Hold tubing with _right____ arm(s) at side. Pull forward and up. Move shoulder through pain-free range of motion. Repeat __10__ times per set.  Do _1-2_ sessions per day , every other day   Strengthening: Resisted Extension   Hold tubing in _right____ hand(s), arm forward. Pull arm back, elbow straight. Repeat _10___ times per set. Do _1-2___ sessions per day, every other day.   Resisted Horizontal Abduction: Bilateral- perform seated, keep arms low   Sit or stand, tubing in both hands, arms out in front. Keeping arms straight, pinch shoulder blades together and stretch arms out. Repeat _10___ times per set. Do _1-2___ sessions per day, every other day.   Elbow Flexion: Resisted   With tubing held in __right____ hand(s) and other end secured under foot, curl arm up as far as possible. Repeat _10___ times per set. Do _1-2___ sessions per day, every other day.    Elbow Extension: Resisted   Sit in chair with resistive band in one hand and hold with other hand with  __right_____ elbow bent. Straighten elbow. Repeat _10___ times per set.  Do _1-2___ sessions per day, every other day.   Copyright  VHI. All rights reserved.

## 2019-06-04 NOTE — Therapy (Signed)
[facility] [address] [place], [place], [address] Phone: [phone]   Fax:  [phone]  Occupational Therapy Treatment  Patient Details  Name: Benjamin Rosales. MRN: [id number] Date of Birth: 1955/06/01 No data recorded  Encounter Date: 06/04/2019  OT End of Session - 06/04/19 1026    Visit Number  8    Number of Visits  17    Date for OT Re-Evaluation  07/02/19    Authorization Type  BC/BS [employer] - 20 VL each discipline    Authorization - Visit Number  8    Authorization - Number of Visits  20    OT Start Time  1020    OT Stop Time  1100    OT Time Calculation (min)  40 min    Activity Tolerance  Patient tolerated treatment well    Behavior During Therapy  WFL for tasks assessed/performed       Past Medical History:  Diagnosis Date  . Diabetes mellitus without complication (HCC)   . Hypercholesteremia   . Hypertension   . Stroke [facility]) 04/2019    Past Surgical History:  Procedure Laterality Date  . TONSILLECTOMY      There were no vitals filed for this visit.  Subjective Assessment - 06/04/19 1225    Currently in Pain?  No/denies              Treatment: Purdue pegboard with RUE for increased fine motor coordination, removing with in hand manipulation, min difficulty. Gripper set at level 3 to pick up 1 inch blocks for sustained grip, min difficulty.             OT Education - 06/04/19 1224    Education Details  Yellow theraband HEP see pt instructions    Person(s) Educated  Patient    Methods  Explanation;Demonstration;Handout    Comprehension  Verbalized understanding;Returned demonstration       OT Short Term Goals - 06/04/19 1024      OT SHORT TERM GOAL #1   Title  Independent with RUE strengthening HEP    Time  4    Period  Weeks    Status  On-going      OT SHORT TERM GOAL #2   Title  Independent with LE dressing using A/E PRN    Time  4    Period  Weeks     Status  Achieved      OT SHORT TERM GOAL #3   Title  Independent with coordination and putty HEP for Rt hand    Time  4    Period  Weeks    Status  Achieved      OT SHORT TERM GOAL #4   Title  Pt to perform 10 min or more of dynamic standing w/o rest or LOB    Time  4    Period  Weeks    Status  On-going      OT SHORT TERM GOAL #5   Title  Improve coordination Rt hand as evidenced by reducing speed on 9 hole peg test to 50 sec. or less    Baseline  60.66 sec    Time  4    Period  Weeks    Status  On-going      OT SHORT TERM GOAL #6   Title  Pt to cut food w/ A/E prn safely    Time  4    Period  Weeks    Status  On-going        OT Long Term Goals - 05/02/19 1329      OT LONG TERM GOAL #1   Title  Pt to perform full cooking tasks safely at mod I level w/ only 1 rest break    Time  8    Period  Weeks    Status  New      OT LONG TERM GOAL #2   Title  Pt to perform heavier cleaning tasks safely at mod I level w/ rest breaks prn (taking out trash, vacuuming, etc)    Time  8    Period  Weeks    Status  New      OT LONG TERM GOAL #3   Title  Pt to improve coordination as evidenced by reducing speed on 9 hole peg test to 40 sec. or less Rt hand    Baseline  60.66 sec    Time  8    Period  Weeks    Status  New      OT LONG TERM GOAL #4   Title  Grip strength Rt hand to be 50 lbs or greater    Baseline  35.9 lbs    Time  8    Period  Weeks    Status  New      OT LONG TERM GOAL #5   Title  Pt to demo sufficient strength RUE to retrieve/replace 5 lb object from high shelf w/o pain and min compensations    Time  8    Period  Weeks    Status  New      Long Term Additional Goals   Additional Long Term Goals  Yes      OT LONG TERM GOAL #6   Title  Pt to perform environmental scanning at 90% accuracy w/ simple physical task    Time  8    Period  Weeks    Status  New            Plan - 06/04/19 1034    Clinical Impression Statement  Pt progressing  towards goals with improving RUE fine motor coordination and strength    Occupational performance deficits (Please refer to evaluation for details):  ADL's;IADL's;Work;Leisure    Body Structure / Function / Physical Skills  ADL;IADL;Balance;Body mechanics;Sensation;Mobility;Strength;Coordination;FMC;UE functional use;Decreased knowledge of use of DME    Rehab Potential  Good    Comorbidities impacting occupational performance description:  obesity    OT Frequency  2x / week    OT Duration  8 weeks    OT Treatment/Interventions  Self-care/ADL training;Therapeutic exercise;Functional Mobility Training;Aquatic Therapy;Neuromuscular education;Manual Therapy;Therapeutic activities;Coping strategies training;DME and/or AE instruction;Passive range of motion;Patient/family education    Plan  dynamic standing balance, start checking short term goals    Consulted and Agree with Plan of Care  Patient       Patient will benefit from skilled therapeutic intervention in order to improve the following deficits and impairments:   Body Structure / Function / Physical Skills: ADL, IADL, Balance, Body mechanics, Sensation, Mobility, Strength, Coordination, FMC, UE functional use, Decreased knowledge of use of DME       Visit Diagnosis: Hemiplegia and hemiparesis following cerebral infarction affecting right non-dominant side (HCC)  Muscle weakness (generalized)  Other lack of coordination  Other abnormalities of gait and mobility    Problem List Patient Active Problem List   Diagnosis Date Noted  . Hypertension, essential, benign 05/22/2019  . Morbid obesity (HCC) 05/22/2019  .  DM (diabetes mellitus), type 2 with peripheral vascular complications ([place]) 33/54/5625  . Lacunar stroke (Ben[facility] Rosales) 04/12/2019    Benjamin Rosales  Benjamin Rosales [address] [place], [place], [address] Phone: [phone]   Fax:   [phone]  Name: Benjamin Rosales. MRN: [id number] Date of Birth: 1955-08-16

## 2019-06-05 NOTE — Therapy (Signed)
[facility] [address] [place], [place], [address] Phone: [phone]   Fax:  [phone]  Physical Therapy Treatment  Patient Details  Name: Benjamin Rosales. MRN: [id number] Date of Birth: 04-Jan-1956 Referring Provider (PT): Referred by Pecola Leisure but will be following up with Dr. Leta Baptist   Encounter Date: 06/04/2019  PT End of Session - 06/04/19 1102    Visit Number  8    Number of Visits  17    Date for PT Re-Evaluation  07/01/19    Authorization Type  FOTO,   BCBS 20 PT visits but additional visits can be requested by patient through their nurse care coordinator    PT Start Time  1100    PT Stop Time  1146    PT Time Calculation (min)  46 min    Equipment Utilized During Treatment  Gait belt    Activity Tolerance  Patient tolerated treatment well    Behavior During Therapy  [facility] for tasks assessed/performed       Past Medical History:  Diagnosis Date  . Diabetes mellitus without complication ([place])   . Hypercholesteremia   . Hypertension   . Stroke [facility]) 04/2019    Past Surgical History:  Procedure Laterality Date  . TONSILLECTOMY      Vitals:   06/04/19 1101  BP: (!) 158/82    Subjective Assessment - 06/04/19 1101    Subjective  Had a cortisone injection in right knee yesterday with Dr. Junius Roads. Was told it may take a day or two before he feels a difference. No falls.    Pertinent History  Symptoms started 3 days prior to going to hospital on 04/12/19 with right side weakness, slurred speech. Found to have acute lacunar infarct. Likely new onset DM.PMH: morbid obesity, HTN, alcholol abuse, hyperlipidemia    Patient Stated Goals  Pt would like to improve strength on right side and improve stamina.    Currently in Pain?  No/denies    Pain Score  0-No pain           [facility] Adult PT Treatment/Exercise - 06/04/19 1105      Transfers   Transfers  Sit to Stand    Sit to Stand  5:  Supervision    Stand to Sit  5: Supervision      Ambulation/Gait   Ambulation/Gait  Yes    Ambulation/Gait Assistance  5: Supervision;4: Min guard;4: Min assist    Ambulation/Gait Assistance Details  increased assistance needed for balance on gravel and grass surfaces (min guard to min assist), otherwise min guard to supervision with gait on paved surfaces. supervision around gym with session.    Ambulation Distance (Feet)  500 Feet    Assistive device  None    Gait Pattern  Step-through pattern;Decreased stance time - right;Wide base of support;Antalgic;Decreased stride length;Shuffle    Ambulation Surface  Level;Unlevel;Indoor;Outdoor;Paved;Gravel;Grass      High Level Balance   High Level Balance Activities  Side stepping;Marching forwards;Marching backwards;Tandem walking   tandem fwd/bwd   High Level Balance Comments  on blue mat in parallel bars with light UE support- 3 laps each with min guard assist for balance with cues on form/technique.      Neuro Re-ed    Neuro Re-ed Details   for balance/muscle re-ed: on doubled blue foam with feet close together for EC no head movements, progressing to feet hip width apart for EC head movements left<>right, up<>down. min guard to min assist  for balance with no UE support.       Knee/Hip Exercises: Aerobic   Other Aerobic  Scifit LE's, some UE's level 3.0 for 6 minutes with goal rpm 40-45 for strengthening and activity tolerance          PT Short Term Goals - 06/04/19 1104      PT SHORT TERM GOAL #1   Title  Pt will be independent with initial HEP for strengthening and balance to continue gains on own.    Baseline  met    Status  Achieved      PT SHORT TERM GOAL #2   Title  Pt's balance/gait will be further assessed with FGA testing and LTG written.    Baseline  05/08/19: met in session today with score of 11/30. Primary PT to set goals.    Status  Achieved      PT SHORT TERM GOAL #3   Title  6 min walk test will be performed to  further assess gait and activity tolerance to get baseline for LTG.    Baseline  05/08/19: met in session today with distance of 735 feet no AD, one brief standing rest break. Primary PT to set goals.    Status  Achieved      PT SHORT TERM GOAL #4   Title  Pt will increase 30 sec sit to stand from 11 reps to 13 or more reps from standard chair for improved functional strength.    Baseline  10 reps on 3/29, w/o use of UE, improved just not to goal level    Status  Partially Met      PT SHORT TERM GOAL #5   Title  Pt will report <6/10 pain in right knee with activities for improved function.    Baseline  met    Status  Achieved        PT Long Term Goals - 05/27/19 0857      PT LONG TERM GOAL #1   Title  Pt will be independent with progressive HEP for strength, balance and functional mobility to continue gains on own.    Time  8    Period  Weeks    Status  New      PT LONG TERM GOAL #2   Title  Pt will increase gait speed from 0.51ms to >0.742m for improved gait safety in community.    Baseline  0.5679mon 05/02/19    Time  8    Period  Weeks    Status  New      PT LONG TERM GOAL #3   Title  Pt will decrease TUG from 18.88 sec to <15 sec for improved balance and functional strength.    Baseline  18.88sec on 05/02/19    Time  8    Period  Weeks    Status  New      PT LONG TERM GOAL #4   Title  Pt will ambulate >500' on varied surfaces independently for safe community distances.    Time  8    Period  Weeks    Status  New      PT LONG TERM GOAL #5   Title  Pt will increase 6 min walk distance from 735' to 935' for improved gait and activity tolerance.    Baseline  735' on 05/08/19 with 1 standing rest break    Time  8    Period  Weeks    Status  Revised  PT LONG TERM GOAL #6   Title  Pt will ambulate up/down 4 steps without railing independently to safely enter/exit home.    Time  8    Period  Weeks    Status  New      PT LONG TERM GOAL #7   Title  Pt will increase  FGA from 11/30 to 16/30 or more for improved balance and gait.    Baseline  11/30 on 05/08/19    Time  8    Period  Weeks    Status  New            Plan - 06/04/19 1103    Clinical Impression Statement  Today's skilled session continued to address LE strengthening, activity tolerance and balance training on compliant surfaces. Initiated gait training on outdoor compliant surfaces with up to min assist needed with 2 balance losses. The pt improved with less overall assistance needed on compliant surfaces indoors with activiites. The pt is progressing toward goals and should benefit from continued PT to progress toward unmet goals.    Personal Factors and Comorbidities  Comorbidity 3+;Social Background    Comorbidities  morbid obesity, HTN, alcholol abuse, hyperlipidemia    Examination-Activity Limitations  Locomotion Level;Stand;Stairs    Examination-Participation Restrictions  Yard Work;Community Activity   job   Stability/Clinical Decision Making  Evolving/Moderate complexity    Rehab Potential  Good    PT Frequency  2x / week    PT Duration  8 weeks    PT Treatment/Interventions  ADLs/Self Care Home Management;Cryotherapy;Moist Heat;Therapeutic exercise;Therapeutic activities;Functional mobility training;Stair training;Gait training;DME Instruction;Balance training;Neuromuscular re-education;Patient/family education;Manual techniques;Passive range of motion;Energy conservation    PT Next Visit Plan  continue to work on LE strengthening, balance reactions on compliant surfaces, activity tolerance and gait on various surfaces. Pt hopes to return to work, will need to be able to stand for prolonged periods of time (is a Tourist information centre manager at [employer])    [place]: [id number]    Consulted and Agree with Plan of Care  Patient;Other (Comment)   girlfriend      Patient will benefit from skilled therapeutic intervention in order to improve the following deficits and impairments:   Abnormal gait, Cardiopulmonary status limiting activity, Decreased activity tolerance, Decreased balance, Decreased endurance, Decreased coordination, Decreased strength, Pain  Visit Diagnosis: Muscle weakness (generalized)  Other abnormalities of gait and mobility     Problem List Patient Active Problem List   Diagnosis Date Noted  . Hypertension, essential, benign 05/22/2019  . Morbid obesity ([place]) 05/22/2019  . DM (diabetes mellitus), type 2 with peripheral vascular complications ([place]) 78/46/9629  . Lacunar stroke ([place]) 04/12/2019    Willow Ora, PTA, [place] [address], [place], [place] [address] [phone] 06/05/19, 7:31 AM   Name: Benjamin Rosales. MRN: [id number] Date of Birth: [birth year]

## 2019-06-06 ENCOUNTER — Ambulatory Visit: Payer: BC Managed Care – PPO | Attending: Internal Medicine

## 2019-06-06 ENCOUNTER — Ambulatory Visit: Payer: BC Managed Care – PPO | Admitting: Occupational Therapy

## 2019-06-06 ENCOUNTER — Ambulatory Visit: Payer: BC Managed Care – PPO | Admitting: Physical Therapy

## 2019-06-06 ENCOUNTER — Encounter: Payer: Self-pay | Admitting: Physical Therapy

## 2019-06-06 ENCOUNTER — Other Ambulatory Visit: Payer: Self-pay

## 2019-06-06 DIAGNOSIS — M6281 Muscle weakness (generalized): Secondary | ICD-10-CM | POA: Diagnosis not present

## 2019-06-06 DIAGNOSIS — Z23 Encounter for immunization: Secondary | ICD-10-CM

## 2019-06-06 DIAGNOSIS — I69353 Hemiplegia and hemiparesis following cerebral infarction affecting right non-dominant side: Secondary | ICD-10-CM

## 2019-06-06 DIAGNOSIS — R2681 Unsteadiness on feet: Secondary | ICD-10-CM

## 2019-06-06 DIAGNOSIS — R2689 Other abnormalities of gait and mobility: Secondary | ICD-10-CM

## 2019-06-06 DIAGNOSIS — R278 Other lack of coordination: Secondary | ICD-10-CM

## 2019-06-06 NOTE — Progress Notes (Signed)
   [id number]  Name:  Jalin Alicea.    MRN: [id number] DOB: 1955/12/10  06/06/2019  Mr. Roker was observed post Covid-19 immunization for 15 minutes without incident. He was provided with Vaccine Information Sheet and instruction to access the V-Safe system.   Mr. Vaeth was instructed to call 911 with any severe reactions post vaccine: Marland Kitchen Difficulty breathing  . Swelling of face and throat  . A fast heartbeat  . A bad rash all over body  . Dizziness and weakness   Immunizations Administered    Name Date Dose VIS Date Route   Pfizer COVID-19 Vaccine 06/06/2019  1:50 PM 0.3 mL 02/08/2019 Intramuscular   Manufacturer: [employer], [employer]   Lot: [id number]   NDC: [address]-0

## 2019-06-06 NOTE — Therapy (Signed)
[facility] [address] [place], [place], [address] Phone: [phone]   Fax:  [phone]  Occupational Therapy Treatment  Patient Details  Name: Benjamin Rosales. MRN: [id number] Date of Birth: [birth year] No data recorded  Encounter Date: 06/06/2019  OT End of Session - 06/06/19 1221    Visit Number  9    Number of Visits  17    Date for OT Re-Evaluation  07/02/19    Authorization Type  BC/BS [employer] - 20 VL each discipline    Authorization - Visit Number  9    Authorization - Number of Visits  20    OT Start Time  1150    OT Stop Time  1230    OT Time Calculation (min)  40 min    Activity Tolerance  Patient tolerated treatment well    Behavior During Therapy  WFL for tasks assessed/performed       Past Medical History:  Diagnosis Date  . Diabetes mellitus without complication ([place])   . Hypercholesteremia   . Hypertension   . Stroke [facility]) 04/2019    Past Surgical History:  Procedure Laterality Date  . TONSILLECTOMY      There were no vitals filed for this visit.  Subjective Assessment - 06/06/19 1151    Subjective   I got a cortisone injection for my Rt knee on Monday    Pertinent History  lacunar stroke w/ Rt hemiparesis 04/12/19. PMH: obesity, T2DM, HTN, HLD, ETOH abuse, chronic small vessel ischemic dz    Limitations  driving    Currently in Pain?  Yes    Pain Location  Knee    Pain Orientation  Right    Pain Descriptors / Indicators  Dull    Pain Type  Chronic pain    Pain Onset  More than a month ago    Pain Frequency  Intermittent       Assessed goals and progress to date. Pt progressing well towards goals. 9 hole peg test Rt hand = 31.97 sec, grip strength = 45 lbs.  Pt standing for > 15 min to perform IADLS including: making bed, sweeping, and getting things off floor w/ one hand countertop support. Pt shown alternative ways for sweeping dirt into dustpan if pt's balance is not good  for fall prevention.  Pt performing high level reaching RUE to retrieve/replace cones Gripper set at level 4 resistance to pick up blocks Rt hand with 2 rest breaks needed.                      OT Short Term Goals - 06/06/19 1222      OT SHORT TERM GOAL #1   Title  Independent with RUE strengthening HEP    Time  4    Period  Weeks    Status  Achieved      OT SHORT TERM GOAL #2   Title  Independent with LE dressing using A/E PRN    Time  4    Period  Weeks    Status  Achieved      OT SHORT TERM GOAL #3   Title  Independent with coordination and putty HEP for Rt hand    Time  4    Period  Weeks    Status  Achieved      OT SHORT TERM GOAL #4   Title  Pt to perform 10 min or more of dynamic standing w/o rest or  LOB    Time  4    Period  Weeks    Status  Achieved   15 minutes     OT SHORT TERM GOAL #5   Title  Improve coordination Rt hand as evidenced by reducing speed on 9 hole peg test to 50 sec. or less    Baseline  60.66 sec    Time  4    Period  Weeks    Status  Achieved   31.97 sec     OT SHORT TERM GOAL #6   Title  Pt to cut food w/ A/E prn safely    Time  4    Period  Weeks    Status  Achieved        OT Long Term Goals - 06/06/19 1223      OT LONG TERM GOAL #1   Title  Pt to perform full cooking tasks safely at mod I level w/ only 1 rest break    Time  8    Period  Weeks    Status  On-going      OT LONG TERM GOAL #2   Title  Pt to perform heavier cleaning tasks safely at mod I level w/ rest breaks prn (taking out trash, vacuuming, etc)    Time  8    Period  Weeks    Status  On-going   Pt reports taking out trash     OT LONG TERM GOAL #3   Title  Pt to improve coordination as evidenced by reducing speed on 9 hole peg test to 40 sec. or less Rt hand    Baseline  60.66 sec    Time  8    Period  Weeks    Status  Achieved   31.97 sec     OT LONG TERM GOAL #4   Title  Grip strength Rt hand to be 50 lbs or greater    Baseline   35.9 lbs    Time  8    Period  Weeks    Status  On-going   45 lbs     OT LONG TERM GOAL #5   Title  Pt to demo sufficient strength RUE to retrieve/replace 5 lb object from high shelf w/o pain and min compensations    Time  8    Period  Weeks    Status  On-going      OT LONG TERM GOAL #6   Title  Pt to perform environmental scanning at 90% accuracy w/ simple physical task    Time  8    Period  Weeks    Status  New            Plan - 06/06/19 1224    Clinical Impression Statement  Pt met all STG's and 1 LTG and approximating remaining goals. Pt with improved grip strength and coordination Rt hand. Pt also with overall improved balance during functional tasks.    Occupational performance deficits (Please refer to evaluation for details):  ADL's;IADL's;Work;Leisure    Body Structure / Function / Physical Skills  ADL;IADL;Balance;Body mechanics;Sensation;Mobility;Strength;Coordination;FMC;UE functional use;Decreased knowledge of use of DME    Rehab Potential  Good    Comorbidities impacting occupational performance description:  obesity    OT Frequency  2x / week    OT Duration  8 weeks    OT Treatment/Interventions  Self-care/ADL training;Therapeutic exercise;Functional Mobility Training;Aquatic Therapy;Neuromuscular education;Manual Therapy;Therapeutic activities;Coping strategies training;DME and/or AE instruction;Passive range of motion;Patient/family education  Plan  continue RUE strength, coordination, dynamic standing    Consulted and Agree with Plan of Care  Patient       Patient will benefit from skilled therapeutic intervention in order to improve the following deficits and impairments:   Body Structure / Function / Physical Skills: ADL, IADL, Balance, Body mechanics, Sensation, Mobility, Strength, Coordination, FMC, UE functional use, Decreased knowledge of use of DME       Visit Diagnosis: Hemiplegia and hemiparesis following cerebral infarction affecting right  non-dominant side (HCC)  Muscle weakness (generalized)  Unsteadiness on feet  Other lack of coordination    Problem List Patient Active Problem List   Diagnosis Date Noted  . Hypertension, essential, benign 05/22/2019  . Morbid obesity ([place]) 05/22/2019  . DM (diabetes mellitus), type 2 with peripheral vascular complications ([place]) 24/19/9144  . Lacunar stroke ([place]) 04/12/2019    Carey Bullocks, OTR/L 06/06/2019, 1:58 PM  [facility] [address] [place], [place], [address] Phone: [phone]   Fax:  [phone]  Name: Logon Uttech. MRN: [id number] Date of Birth: 1955-10-31

## 2019-06-07 NOTE — Therapy (Signed)
[facility] [address] [place], [place], [address] Phone: [phone]   Fax:  [phone]  Physical Therapy Treatment  Patient Details  Name: Benjamin Rosales. MRN: [id number] Date of Birth: [birth year] Referring Provider (PT): Referred by Pecola Leisure but will be following up with Dr. Leta Baptist   Encounter Date: 06/06/2019  PT End of Session - 06/06/19 1233    Visit Number  9    Number of Visits  17    Date for PT Re-Evaluation  07/01/19    Authorization Type  FOTO,   BCBS 20 PT visits but additional visits can be requested by patient through their nurse care coordinator    PT Start Time  1231    PT Stop Time  1315    PT Time Calculation (min)  44 min    Equipment Utilized During Treatment  Gait belt    Activity Tolerance  Patient tolerated treatment well    Behavior During Therapy  [facility] for tasks assessed/performed       Past Medical History:  Diagnosis Date  . Diabetes mellitus without complication ([place])   . Hypercholesteremia   . Hypertension   . Stroke [facility]) 04/2019    Past Surgical History:  Procedure Laterality Date  . TONSILLECTOMY      There were no vitals filed for this visit.  Subjective Assessment - 06/06/19 1232    Subjective  No new complaints. No falls. "Knees are okay".    Pertinent History  Symptoms started 3 days prior to going to hospital on 04/12/19 with right side weakness, slurred speech. Found to have acute lacunar infarct. Likely new onset DM.PMH: morbid obesity, HTN, alcholol abuse, hyperlipidemia    Patient Stated Goals  Pt would like to improve strength on right side and improve stamina.    Currently in Pain?  No/denies    Pain Score  0-No pain          [facility] Adult PT Treatment/Exercise - 06/06/19 1234      Transfers   Transfers  Sit to Stand;Stand to Sit    Sit to Stand  5: Supervision;With upper extremity assist;Without upper extremity assist;From bed;From  chair/3-in-1    Stand to Sit  5: Supervision;With upper extremity assist;Without upper extremity assist;To bed;To chair/3-in-1      Ambulation/Gait   Ambulation/Gait  Yes    Ambulation/Gait Assistance  5: Supervision;4: Min guard    Ambulation/Gait Assistance Details  min guard assist on rubber mulch and grass, otherwise close supervision    Ambulation Distance (Feet)  500 Feet   x1, plus around gym with session   Assistive device  None    Gait Pattern  Step-through pattern;Decreased stance time - right;Wide base of support;Antalgic;Decreased stride length;Shuffle    Ambulation Surface  Level;Indoor;Unlevel;Outdoor;Paved;Grass;Other (comment)   rubber mulch     High Level Balance   High Level Balance Activities  Negotiating over obstacles    High Level Balance Comments  on blue mat next to counter with 4 small hurdles on it: forward reciprocal stepping over hurdles for 6 laps with UE support on counter, cues for increased hip/knee flexion. then lateral stepping over hurdles for 3 laps toward each direction with support on counter, cues for increased hip/knee flexion and larger step length.                            Neuro Re-ed    Neuro Re-ed Details  for balance/muscle re-ed: gait around track working on speed changes, scanning all directions with min guard assist with up direction, otherwise close superivsion.       Knee/Hip Exercises: Aerobic   Other Aerobic  Scifit LE's, some UE's level 3.0 for 6 minutes with goal rpm 40-45 for strengthening and activity tolerance          Balance Exercises - 06/06/19 1308      Balance Exercises: Standing   Standing Eyes Closed  Narrow base of support (BOS);Foam/compliant surface;Other reps (comment);Head turns;30 secs;Limitations    Standing Eyes Closed Limitations  on dense open blue foam in corner with chair in front for safety: feet hip width apart for EC no head movements, progressing to EC head movements left<>right, up<>down with min  guard to min assist for balance, no UE support. cues for posture and weight shifting for balance assist.     SLS with Vectors  Foam/compliant surface;Upper extremity assist 2;Other reps (comment);Limitations    SLS with Vectors Limitations  4 cones along edges of blue mat next to counter with bil fingertip support for balance: bil foot taps to each cone with side stepping left<>right for 2 laps each way.           PT Short Term Goals - 06/04/19 1104      PT SHORT TERM GOAL #1   Title  Pt will be independent with initial HEP for strengthening and balance to continue gains on own.    Baseline  met    Status  Achieved      PT SHORT TERM GOAL #2   Title  Pt's balance/gait will be further assessed with FGA testing and LTG written.    Baseline  05/08/19: met in session today with score of 11/30. Primary PT to set goals.    Status  Achieved      PT SHORT TERM GOAL #3   Title  6 min walk test will be performed to further assess gait and activity tolerance to get baseline for LTG.    Baseline  05/08/19: met in session today with distance of 735 feet no AD, one brief standing rest break. Primary PT to set goals.    Status  Achieved      PT SHORT TERM GOAL #4   Title  Pt will increase 30 sec sit to stand from 11 reps to 13 or more reps from standard chair for improved functional strength.    Baseline  10 reps on 3/29, w/o use of UE, improved just not to goal level    Status  Partially Met      PT SHORT TERM GOAL #5   Title  Pt will report <6/10 pain in right knee with activities for improved function.    Baseline  met    Status  Achieved        PT Long Term Goals - 05/27/19 0857      PT LONG TERM GOAL #1   Title  Pt will be independent with progressive HEP for strength, balance and functional mobility to continue gains on own.    Time  8    Period  Weeks    Status  New      PT LONG TERM GOAL #2   Title  Pt will increase gait speed from 0.12ms to >0.723m for improved gait safety in  community.    Baseline  0.5680mon 05/02/19    Time  8    Period  Weeks  Status  New      PT LONG TERM GOAL #3   Title  Pt will decrease TUG from 18.88 sec to <15 sec for improved balance and functional strength.    Baseline  18.88sec on 05/02/19    Time  8    Period  Weeks    Status  New      PT LONG TERM GOAL #4   Title  Pt will ambulate >500' on varied surfaces independently for safe community distances.    Time  8    Period  Weeks    Status  New      PT LONG TERM GOAL #5   Title  Pt will increase 6 min walk distance from 735' to 935' for improved gait and activity tolerance.    Baseline  735' on 05/08/19 with 1 standing rest break    Time  8    Period  Weeks    Status  Revised      PT LONG TERM GOAL #6   Title  Pt will ambulate up/down 4 steps without railing independently to safely enter/exit home.    Time  8    Period  Weeks    Status  New      PT LONG TERM GOAL #7   Title  Pt will increase FGA from 11/30 to 16/30 or more for improved balance and gait.    Baseline  11/30 on 05/08/19    Time  8    Period  Weeks    Status  New            Plan - 06/06/19 1233    Clinical Impression Statement  Today's skilled session continued to focus on strengthening, activity tolerance and balance reactions with rest breaks taken as needed. The pt is making steady progress toward goals and should benefit from continued PT to progress toward unmet goals.    Personal Factors and Comorbidities  Comorbidity 3+;Social Background    Comorbidities  morbid obesity, HTN, alcholol abuse, hyperlipidemia    Examination-Activity Limitations  Locomotion Level;Stand;Stairs    Examination-Participation Restrictions  Yard Work;Community Activity   job   Stability/Clinical Decision Making  Evolving/Moderate complexity    Rehab Potential  Good    PT Frequency  2x / week    PT Duration  8 weeks    PT Treatment/Interventions  ADLs/Self Care Home Management;Cryotherapy;Moist Heat;Therapeutic  exercise;Therapeutic activities;Functional mobility training;Stair training;Gait training;DME Instruction;Balance training;Neuromuscular re-education;Patient/family education;Manual techniques;Passive range of motion;Energy conservation    PT Next Visit Plan  continue to work on LE strengthening, balance reactions on compliant surfaces, activity tolerance and gait on various surfaces. Pt hopes to return to work, will need to be able to stand for prolonged periods of time (is a Tourist information centre manager at [employer])    [place]: [id number]    Consulted and Agree with Plan of Care  Patient;Other (Comment)   girlfriend      Patient will benefit from skilled therapeutic intervention in order to improve the following deficits and impairments:  Abnormal gait, Cardiopulmonary status limiting activity, Decreased activity tolerance, Decreased balance, Decreased endurance, Decreased coordination, Decreased strength, Pain  Visit Diagnosis: Muscle weakness (generalized)  Other abnormalities of gait and mobility     Problem List Patient Active Problem List   Diagnosis Date Noted  . Hypertension, essential, benign 05/22/2019  . Morbid obesity ([place]) 05/22/2019  . DM (diabetes mellitus), type 2 with peripheral vascular complications ([place]) 54/10/8117  . Lacunar stroke ([place])  04/12/2019   Willow Ora, PTA, [facility] [address] [place], [place] [address] [phone] 06/07/19, 1:10 PM   Name: Benjamin Rosales. MRN: [id number] Date of Birth: 1955-05-08

## 2019-06-10 ENCOUNTER — Ambulatory Visit: Payer: BC Managed Care – PPO | Admitting: Occupational Therapy

## 2019-06-10 ENCOUNTER — Other Ambulatory Visit: Payer: Self-pay

## 2019-06-10 ENCOUNTER — Ambulatory Visit: Payer: BC Managed Care – PPO

## 2019-06-10 DIAGNOSIS — M6281 Muscle weakness (generalized): Secondary | ICD-10-CM

## 2019-06-10 DIAGNOSIS — I69353 Hemiplegia and hemiparesis following cerebral infarction affecting right non-dominant side: Secondary | ICD-10-CM

## 2019-06-10 DIAGNOSIS — R2681 Unsteadiness on feet: Secondary | ICD-10-CM

## 2019-06-10 NOTE — Therapy (Signed)
[facility] [address] [place], [place], [address] Phone: [phone]   Fax:  [phone]  Occupational Therapy Treatment  Patient Details  Name: Benjamin Rosales. MRN: [id number] Date of Birth: [birth year] No data recorded  Encounter Date: 06/10/2019  OT End of Session - 06/10/19 1036    Visit Number  10    Number of Visits  17    Date for OT Re-Evaluation  07/02/19    Authorization Type  BC/BS [employer] - 20 VL each discipline    Authorization - Visit Number  10    Authorization - Number of Visits  20    OT Start Time  5852    OT Stop Time  1100    OT Time Calculation (min)  45 min    Activity Tolerance  Patient tolerated treatment well    Behavior During Therapy  WFL for tasks assessed/performed       Past Medical History:  Diagnosis Date  . Diabetes mellitus without complication ([place])   . Hypercholesteremia   . Hypertension   . Stroke [facility]) 04/2019    Past Surgical History:  Procedure Laterality Date  . TONSILLECTOMY      There were no vitals filed for this visit.  Subjective Assessment - 06/10/19 1016    Pertinent History  lacunar stroke w/ Rt hemiparesis 04/12/19. PMH: obesity, T2DM, HTN, HLD, ETOH abuse, chronic small vessel ischemic dz    Limitations  driving    Currently in Pain?  No/denies      Pt placing 3 lb object on high shelf x 5 reps, followed by placing/retrieving 5 lb object on high self x 5 reps all w/ RUE.  Standing: bilateral sh flexion, then abduction x 10 reps each w/ 2 lb weight in each UE and cues to prevent sh hiking.  Functional ambulation carrying plate Rt hand and cup of water Lt hand with min difficulty, then again switching cup of water to Rt hand and plate to Lt hand w/ mod difficulty and 1 near spill Rt hand.   UBE x 10 min, level 5 resistance for UB conditioning/endurance (5 min forward, 5 min backward)  Gripper set at level 4 resistance to pick up blocks RT hand  for sustained grip strength w/ mod difficulty and 2 rest breaks needed.                        OT Short Term Goals - 06/06/19 1222      OT SHORT TERM GOAL #1   Title  Independent with RUE strengthening HEP    Time  4    Period  Weeks    Status  Achieved      OT SHORT TERM GOAL #2   Title  Independent with LE dressing using A/E PRN    Time  4    Period  Weeks    Status  Achieved      OT SHORT TERM GOAL #3   Title  Independent with coordination and putty HEP for Rt hand    Time  4    Period  Weeks    Status  Achieved      OT SHORT TERM GOAL #4   Title  Pt to perform 10 min or more of dynamic standing w/o rest or LOB    Time  4    Period  Weeks    Status  Achieved   15 minutes  OT SHORT TERM GOAL #5   Title  Improve coordination Rt hand as evidenced by reducing speed on 9 hole peg test to 50 sec. or less    Baseline  60.66 sec    Time  4    Period  Weeks    Status  Achieved   31.97 sec     OT SHORT TERM GOAL #6   Title  Pt to cut food w/ A/E prn safely    Time  4    Period  Weeks    Status  Achieved        OT Long Term Goals - 06/10/19 1041      OT LONG TERM GOAL #1   Title  Pt to perform full cooking tasks safely at mod I level w/ only 1 rest break    Time  8    Period  Weeks    Status  On-going      OT LONG TERM GOAL #2   Title  Pt to perform heavier cleaning tasks safely at mod I level w/ rest breaks prn (taking out trash, vacuuming, etc)    Time  8    Period  Weeks    Status  On-going   Pt reports taking out trash     OT LONG TERM GOAL #3   Title  Pt to improve coordination as evidenced by reducing speed on 9 hole peg test to 40 sec. or less Rt hand    Baseline  60.66 sec    Time  8    Period  Weeks    Status  Achieved   31.97 sec     OT LONG TERM GOAL #4   Title  Grip strength Rt hand to be 50 lbs or greater    Baseline  35.9 lbs    Time  8    Period  Weeks    Status  On-going   45 lbs     OT LONG TERM GOAL #5    Title  Pt to demo sufficient strength RUE to retrieve/replace 5 lb object from high shelf w/o pain and min compensations    Time  8    Period  Weeks    Status  Achieved      OT LONG TERM GOAL #6   Title  Pt to perform environmental scanning at 90% accuracy w/ simple physical task    Time  8    Period  Weeks    Status  New            Plan - 06/10/19 1036    Clinical Impression Statement  Pt progressing with endurance and RUE strength. Pt met LTG #5.    Occupational performance deficits (Please refer to evaluation for details):  ADL's;IADL's;Work;Leisure    Body Structure / Function / Physical Skills  ADL;IADL;Balance;Body mechanics;Sensation;Mobility;Strength;Coordination;FMC;UE functional use;Decreased knowledge of use of DME    Rehab Potential  Good    Comorbidities impacting occupational performance description:  obesity    OT Frequency  2x / week    OT Duration  8 weeks    OT Treatment/Interventions  Self-care/ADL training;Therapeutic exercise;Functional Mobility Training;Aquatic Therapy;Neuromuscular education;Manual Therapy;Therapeutic activities;Coping strategies training;DME and/or AE instruction;Passive range of motion;Patient/family education    Plan  continue RUE strength, coordination, dynamic standing, work on LTG #6       Patient will benefit from skilled therapeutic intervention in order to improve the following deficits and impairments:   Body Structure / Function / Physical Skills: ADL, IADL,  Balance, Body mechanics, Sensation, Mobility, Strength, Coordination, FMC, UE functional use, Decreased knowledge of use of DME       Visit Diagnosis: Hemiplegia and hemiparesis following cerebral infarction affecting right non-dominant side (HCC)  Muscle weakness (generalized)  Unsteadiness on feet    Problem List Patient Active Problem List   Diagnosis Date Noted  . Hypertension, essential, benign 05/22/2019  . Morbid obesity ([place]) 05/22/2019  . DM (diabetes  mellitus), type 2 with peripheral vascular complications ([place]) 68/59/9234  . Lacunar stroke ([place]) 04/12/2019    Carey Bullocks, OTR/L 06/10/2019, 10:42 AM  [facility] [address] [place], [place], [address] Phone: [phone]   Fax:  [phone]  Name: Aaren Atallah. MRN: [id number] Date of Birth: 1955/11/16

## 2019-06-12 ENCOUNTER — Ambulatory Visit (INDEPENDENT_AMBULATORY_CARE_PROVIDER_SITE_OTHER): Payer: BC Managed Care – PPO | Admitting: Family Medicine

## 2019-06-12 ENCOUNTER — Other Ambulatory Visit: Payer: Self-pay

## 2019-06-12 ENCOUNTER — Encounter: Payer: Self-pay | Admitting: Family Medicine

## 2019-06-12 VITALS — BP 158/70 | HR 86 | Temp 98.6°F | Wt 284.6 lb

## 2019-06-12 DIAGNOSIS — L03316 Cellulitis of umbilicus: Secondary | ICD-10-CM

## 2019-06-12 MED ORDER — MUPIROCIN 2 % EX OINT: 1.0000 "application " | TOPICAL_OINTMENT | Freq: Two times a day (BID) | CUTANEOUS | 0 refills | Status: DC

## 2019-06-12 NOTE — Progress Notes (Signed)
   Subjective:    Patient ID: Benjamin Rosales., male    DOB: Aug 14, 1955, 64 y.o.   MRN: [id number]  HPI Here for 4 days of light bleeding from the navel. He has had an umbilical hernia for many years, but has finally decided to have this repaired. He is scheduled to see Dr. Rayburn Ma on 06-21-19 to discuss a surgery. There is no pain. Now for 4 days a bloody fluid has been seeping out of a crevasse above the umbilicus. This is not painful.    Review of Systems  Constitutional: Negative.   Respiratory: Negative.   Cardiovascular: Negative.   Gastrointestinal: Negative for abdominal pain, anal bleeding, blood in stool, constipation, diarrhea, nausea, rectal pain and vomiting.       Objective:   Physical Exam Constitutional:      General: He is not in acute distress.    Appearance: He is obese.  Cardiovascular:     Rate and Rhythm: Normal rate and regular rhythm.     Pulses: Normal pulses.     Heart sounds: Normal heart sounds.  Pulmonary:     Effort: Pulmonary effort is normal.     Breath sounds: Normal breath sounds.  Abdominal:     Comments: He is obese. There is a large non-tender non-reducible umbilical hernia. Just above this is a tight skin fold with dried blood on the edge. This was gently retracted and purulent fluid was seen.   Neurological:     Mental Status: He is alert.           Assessment & Plan:  Periumbilical cellulitis. A sample of this was sent for culture. Treat with Bactroban ointment BID.  Gershon Crane, MD

## 2019-06-13 ENCOUNTER — Ambulatory Visit: Payer: BC Managed Care – PPO | Admitting: Occupational Therapy

## 2019-06-13 ENCOUNTER — Ambulatory Visit: Payer: BC Managed Care – PPO

## 2019-06-13 DIAGNOSIS — M6281 Muscle weakness (generalized): Secondary | ICD-10-CM | POA: Diagnosis not present

## 2019-06-13 DIAGNOSIS — R2681 Unsteadiness on feet: Secondary | ICD-10-CM

## 2019-06-13 DIAGNOSIS — I69353 Hemiplegia and hemiparesis following cerebral infarction affecting right non-dominant side: Secondary | ICD-10-CM

## 2019-06-13 NOTE — Therapy (Signed)
[facility] [address] [place], [place], [address] Phone: [phone]   Fax:  [phone]  Occupational Therapy Treatment  Patient Details  Name: Benjamin Rosales. MRN: [id number] Date of Birth: 03-Apr-1955 No data recorded  Encounter Date: 06/13/2019  OT End of Session - 06/13/19 1139    Visit Number  11    Number of Visits  17    Date for OT Re-Evaluation  07/02/19    Authorization Type  BC/BS [employer] - 20 VL each discipline    Authorization - Visit Number  11    Authorization - Number of Visits  20    OT Start Time  1100    OT Stop Time  1145    OT Time Calculation (min)  45 min    Activity Tolerance  Patient tolerated treatment well    Behavior During Therapy  WFL for tasks assessed/performed       Past Medical History:  Diagnosis Date  . Diabetes mellitus without complication ([place])   . Hypercholesteremia   . Hypertension   . Stroke [facility]) 04/2019    Past Surgical History:  Procedure Laterality Date  . TONSILLECTOMY      There were no vitals filed for this visit.  Subjective Assessment - 06/13/19 1106    Subjective   My Rt knee was a little sore yesterday    Pertinent History  lacunar stroke w/ Rt hemiparesis 04/12/19. PMH: obesity, T2DM, HTN, HLD, ETOH abuse, chronic small vessel ischemic dz    Limitations  driving    Currently in Pain?  No/denies                   OT Treatments/Exercises (OP) - 06/13/19 0001      ADLs   Functional Mobility  Walking with plate of food in Lt hand and cup of water in Rt hand w/ slower ambulation and more attn to Rt hand    ADL Comments  Practiced pouring cup full of water from one cup to another for accuracy/control w/ min difficulty and simulated pouring 1/2 gallon of milk w/ 4 lb weight      Exercises   Exercises  Hand      Shoulder Exercises: ROM/Strengthening   UBE (Upper Arm Bike)  x 10 min, level 5 resistance      Hand Exercises   Other Hand Exercises  Gripper set at level 4 resistance to pick up blocks Rt hand w/ mod difficulty and 2 rest breaks      Visual/Perceptual Exercises   Scanning  Environmental    Scanning - Environmental  Pt performing environmental scanning with ambulation while carrying plate of "food" Rt hand and found 9/13 items on first pass with mod difficulty keeping plate level. Pt missed 2 in higher quadrants, 1 in lower Rt and 1 in lower Lt. Pt found remaining 4 items on 2nd pass and 1 cue needed to keep plate level.                OT Short Term Goals - 06/06/19 1222      OT SHORT TERM GOAL #1   Title  Independent with RUE strengthening HEP    Time  4    Period  Weeks    Status  Achieved      OT SHORT TERM GOAL #2   Title  Independent with LE dressing using A/E PRN    Time  4    Period  Weeks  Status  Achieved      OT SHORT TERM GOAL #3   Title  Independent with coordination and putty HEP for Rt hand    Time  4    Period  Weeks    Status  Achieved      OT SHORT TERM GOAL #4   Title  Pt to perform 10 min or more of dynamic standing w/o rest or LOB    Time  4    Period  Weeks    Status  Achieved   15 minutes     OT SHORT TERM GOAL #5   Title  Improve coordination Rt hand as evidenced by reducing speed on 9 hole peg test to 50 sec. or less    Baseline  60.66 sec    Time  4    Period  Weeks    Status  Achieved   31.97 sec     OT SHORT TERM GOAL #6   Title  Pt to cut food w/ A/E prn safely    Time  4    Period  Weeks    Status  Achieved        OT Long Term Goals - 06/13/19 1140      OT LONG TERM GOAL #1   Title  Pt to perform full cooking tasks safely at mod I level w/ only 1 rest break    Time  8    Period  Weeks    Status  On-going      OT LONG TERM GOAL #2   Title  Pt to perform heavier cleaning tasks safely at mod I level w/ rest breaks prn (taking out trash, vacuuming, etc)    Time  8    Period  Weeks    Status  On-going   Pt reports taking out  trash     OT LONG TERM GOAL #3   Title  Pt to improve coordination as evidenced by reducing speed on 9 hole peg test to 40 sec. or less Rt hand    Baseline  60.66 sec    Time  8    Period  Weeks    Status  Achieved   31.97 sec     OT LONG TERM GOAL #4   Title  Grip strength Rt hand to be 50 lbs or greater    Baseline  35.9 lbs    Time  8    Period  Weeks    Status  On-going   45 lbs     OT LONG TERM GOAL #5   Title  Pt to demo sufficient strength RUE to retrieve/replace 5 lb object from high shelf w/o pain and min compensations    Time  8    Period  Weeks    Status  Achieved      OT LONG TERM GOAL #6   Title  Pt to perform environmental scanning at 90% accuracy w/ simple physical task    Time  8    Period  Weeks    Status  On-going   currently 9/13 (70%)           Plan - 06/13/19 1141    Clinical Impression Statement  Pt progressing with RUE strength, endurance, and functional ambulation    Occupational performance deficits (Please refer to evaluation for details):  ADL's;IADL's;Work;Leisure    Body Structure / Function / Physical Skills  ADL;IADL;Balance;Body mechanics;Sensation;Mobility;Strength;Coordination;FMC;UE functional use;Decreased knowledge of use of DME    Rehab Potential  Good    Comorbidities impacting occupational performance description:  obesity    OT Frequency  2x / week    OT Duration  8 weeks    OT Treatment/Interventions  Self-care/ADL training;Therapeutic exercise;Functional Mobility Training;Aquatic Therapy;Neuromuscular education;Manual Therapy;Therapeutic activities;Coping strategies training;DME and/or AE instruction;Passive range of motion;Patient/family education    Plan  continue RUE strength, coordination, dynamic standing    Consulted and Agree with Plan of Care  Patient       Patient will benefit from skilled therapeutic intervention in order to improve the following deficits and impairments:   Body Structure / Function / Physical  Skills: ADL, IADL, Balance, Body mechanics, Sensation, Mobility, Strength, Coordination, FMC, UE functional use, Decreased knowledge of use of DME       Visit Diagnosis: Hemiplegia and hemiparesis following cerebral infarction affecting right non-dominant side (HCC)  Muscle weakness (generalized)  Unsteadiness on feet    Problem List Patient Active Problem List   Diagnosis Date Noted  . Hypertension, essential, benign 05/22/2019  . Morbid obesity (HCC) 05/22/2019  . DM (diabetes mellitus), type 2 with peripheral vascular complications (HCC) 04/24/2019  . Lacunar stroke (HCC) 04/12/2019    Kelli Churn, OTR/L 06/13/2019, 11:42 AM  [facility] [address] [place], [place], [address] Phone: [phone]   Fax:  [phone]  Name: Benjamin Rosales. MRN: [id number] Date of Birth: Oct 02, 1955

## 2019-06-15 LAB — WOUND CULTURE
MICRO NUMBER:: 10362863
SPECIMEN QUALITY:: ADEQUATE

## 2019-06-18 ENCOUNTER — Other Ambulatory Visit: Payer: Self-pay

## 2019-06-18 ENCOUNTER — Ambulatory Visit: Payer: BC Managed Care – PPO | Admitting: Occupational Therapy

## 2019-06-18 ENCOUNTER — Ambulatory Visit: Payer: BC Managed Care – PPO

## 2019-06-18 VITALS — BP 148/82

## 2019-06-18 DIAGNOSIS — I69353 Hemiplegia and hemiparesis following cerebral infarction affecting right non-dominant side: Secondary | ICD-10-CM

## 2019-06-18 DIAGNOSIS — R2689 Other abnormalities of gait and mobility: Secondary | ICD-10-CM

## 2019-06-18 DIAGNOSIS — M6281 Muscle weakness (generalized): Secondary | ICD-10-CM | POA: Diagnosis not present

## 2019-06-18 DIAGNOSIS — R2681 Unsteadiness on feet: Secondary | ICD-10-CM

## 2019-06-18 DIAGNOSIS — R278 Other lack of coordination: Secondary | ICD-10-CM

## 2019-06-18 NOTE — Therapy (Signed)
[facility] [address] [place], [place], [address] Phone: [phone]   Fax:  [phone]  Physical Therapy Treatment  Patient Details  Name: Benjamin Rosales. MRN: [id number] Date of Birth: 1955-03-29 Referring Provider (PT): Referred by Pecola Leisure but will be following up with Dr. Leta Baptist   Encounter Date: 06/18/2019  PT End of Session - 06/18/19 1019    Visit Number  10    Number of Visits  17    Date for PT Re-Evaluation  07/01/19    Authorization Type  [place],   [employer]BS 20 PT visits but additional visits can be requested by patient through their nurse care coordinator    PT Start Time  1015    PT Stop Time  1100    PT Time Calculation (min)  45 min    Equipment Utilized During Treatment  Gait belt    Activity Tolerance  Patient tolerated treatment well    Behavior During Therapy  [facility] for tasks assessed/performed       Past Medical History:  Diagnosis Date  . Diabetes mellitus without complication ([place])   . Hypercholesteremia   . Hypertension   . Stroke [facility]) 04/2019    Past Surgical History:  Procedure Laterality Date  . TONSILLECTOMY      Vitals:   06/18/19 1017  BP: (!) 148/82    Subjective Assessment - 06/18/19 1017    Subjective  Pt reports that he had cortizone shot the about 3 weeks ago. He reports just minimal improvement with some improved stability. Denies any falls. Pt goes back to PCP tomorrow for final say to return to work. All depends on that but tentative the 24th.    Pertinent History  Symptoms started 3 days prior to going to hospital on 04/12/19 with right side weakness, slurred speech. Found to have acute lacunar infarct. Likely new onset DM.PMH: morbid obesity, HTN, alcholol abuse, hyperlipidemia    Patient Stated Goals  Pt would like to improve strength on right side and improve stamina.    Currently in Pain?  Yes    Pain Score  1    gets sharp pain when up on it 1-6   Pain Location  Knee    Pain Orientation  Right    Pain Descriptors / Indicators  Dull    Pain Type  Chronic pain                       [facility] Adult PT Treatment/Exercise - 06/18/19 1025      Ambulation/Gait   Ambulation/Gait  Yes    Ambulation/Gait Assistance  5: Supervision    Ambulation/Gait Assistance Details  PT added in scanning activities with head turns up/down and left/right 115' each. Backwards gait 40' x 2 with verbal cues to try to increase step length.    Ambulation Distance (Feet)  345 Feet    Assistive device  None    Gait Pattern  Step-through pattern;Decreased stance time - right;Antalgic    Ambulation Surface  Level;Indoor    Gait Comments  Pt ambulated outside 800' over 8 min supervision over sidewalk and grass. Instructed to take time and increase foot clearance on grass. BP=178/86 after gait. 1/10 RPE. After 3 min rest BP down to 160/82.      Neuro Re-ed    Neuro Re-ed Details   Pt performed marching along counter 8' x 4 then added on blue mat 8' x 4, side stepping over 3  cones on mat with light UE support at times, alternating toe taps on cone standing on blue mat 10 x 2 with light UE support when in right SLS.             PT Education - 06/18/19 1217    Education Details  Pt to continue with current HEP    Person(s) Educated  Patient    Methods  Explanation    Comprehension  Verbalized understanding       PT Short Term Goals - 06/04/19 1104      PT SHORT TERM GOAL #1   Title  Pt will be independent with initial HEP for strengthening and balance to continue gains on own.    Baseline  met    Status  Achieved      PT SHORT TERM GOAL #2   Title  Pt's balance/gait will be further assessed with FGA testing and LTG written.    Baseline  05/08/19: met in session today with score of 11/30. Primary PT to set goals.    Status  Achieved      PT SHORT TERM GOAL #3   Title  6 min walk test will be performed to further assess gait and activity  tolerance to get baseline for LTG.    Baseline  05/08/19: met in session today with distance of 735 feet no AD, one brief standing rest break. Primary PT to set goals.    Status  Achieved      PT SHORT TERM GOAL #4   Title  Pt will increase 30 sec sit to stand from 11 reps to 13 or more reps from standard chair for improved functional strength.    Baseline  10 reps on 3/29, w/o use of UE, improved just not to goal level    Status  Partially Met      PT SHORT TERM GOAL #5   Title  Pt will report <6/10 pain in right knee with activities for improved function.    Baseline  met    Status  Achieved        PT Long Term Goals - 05/27/19 0857      PT LONG TERM GOAL #1   Title  Pt will be independent with progressive HEP for strength, balance and functional mobility to continue gains on own.    Time  8    Period  Weeks    Status  New      PT LONG TERM GOAL #2   Title  Pt will increase gait speed from 0.32ms to >0.765m for improved gait safety in community.    Baseline  0.5636mon 05/02/19    Time  8    Period  Weeks    Status  New      PT LONG TERM GOAL #3   Title  Pt will decrease TUG from 18.88 sec to <15 sec for improved balance and functional strength.    Baseline  18.88sec on 05/02/19    Time  8    Period  Weeks    Status  New      PT LONG TERM GOAL #4   Title  Pt will ambulate >500' on varied surfaces independently for safe community distances.    Time  8    Period  Weeks    Status  New      PT LONG TERM GOAL #5   Title  Pt will increase 6 min walk distance from 735' to 935' for improved gait  and activity tolerance.    Baseline  735' on 05/08/19 with 1 standing rest break    Time  8    Period  Weeks    Status  Revised      PT LONG TERM GOAL #6   Title  Pt will ambulate up/down 4 steps without railing independently to safely enter/exit home.    Time  8    Period  Weeks    Status  New      PT LONG TERM GOAL #7   Title  Pt will increase FGA from 11/30 to 16/30 or more  for improved balance and gait.    Baseline  11/30 on 05/08/19    Time  8    Period  Weeks    Status  New            Plan - 06/18/19 1217    Clinical Impression Statement  Pt progressing well towards goals with biggest limitation right knee pain from chronic issues. Pt showed improved coordination with mat activities with practice.    Personal Factors and Comorbidities  Comorbidity 3+;Social Background    Comorbidities  morbid obesity, HTN, alcholol abuse, hyperlipidemia    Examination-Activity Limitations  Locomotion Level;Stand;Stairs    Examination-Participation Restrictions  Yard Work;Community Activity   job   Stability/Clinical Decision Making  Evolving/Moderate complexity    Rehab Potential  Good    PT Frequency  2x / week    PT Duration  8 weeks    PT Treatment/Interventions  ADLs/Self Care Home Management;Cryotherapy;Moist Heat;Therapeutic exercise;Therapeutic activities;Functional mobility training;Stair training;Gait training;DME Instruction;Balance training;Neuromuscular re-education;Patient/family education;Manual techniques;Passive range of motion;Energy conservation    PT Next Visit Plan  How did MD visit go? Is he going back to work? Do we need to add 1 more week? continue to work on LE strengthening, balance reactions on compliant surfaces, activity tolerance and gait on various surfaces. Pt hopes to return to work, will need to be able to stand for prolonged periods of time (is a Tourist information centre manager at [employer])    [place]: [id number]    Consulted and Agree with Plan of Care  Patient;Other (Comment)   girlfriend      Patient will benefit from skilled therapeutic intervention in order to improve the following deficits and impairments:  Abnormal gait, Cardiopulmonary status limiting activity, Decreased activity tolerance, Decreased balance, Decreased endurance, Decreased coordination, Decreased strength, Pain  Visit Diagnosis: Other abnormalities of gait  and mobility  Muscle weakness (generalized)     Problem List Patient Active Problem List   Diagnosis Date Noted  . Hypertension, essential, benign 05/22/2019  . Morbid obesity ([place]) 05/22/2019  . DM (diabetes mellitus), type 2 with peripheral vascular complications ([place]) 12/04/1217  . Lacunar stroke ([place]) 04/12/2019    Electa Sniff, PT, DPT, NCS 06/18/2019, 12:22 PM  [facility] [address] [place], [place], [address] Phone: [phone]   Fax:  [phone]  Name: Benjamin Rosales. MRN: [id number] Date of Birth: May 12, 1955

## 2019-06-18 NOTE — Therapy (Signed)
[facility] [address] [place], [place], [address] Phone: [phone]   Fax:  [phone]  Occupational Therapy Treatment  Patient Details  Name: Benjamin Rosales. MRN: [id number] Date of Birth: 11-10-1955 No data recorded  Encounter Date: 06/18/2019  OT End of Session - 06/18/19 1221    Visit Number  12    Number of Visits  17    Date for OT Re-Evaluation  07/02/19    Authorization Type  BC/BS [employer] - 20 VL each discipline    Authorization - Visit Number  12    Authorization - Number of Visits  20    OT Start Time  1100    OT Stop Time  1145    OT Time Calculation (min)  45 min    Activity Tolerance  Patient tolerated treatment well    Behavior During Therapy  WFL for tasks assessed/performed       Past Medical History:  Diagnosis Date  . Diabetes mellitus without complication (HCC)   . Hypercholesteremia   . Hypertension   . Stroke [facility]) 04/2019    Past Surgical History:  Procedure Laterality Date  . TONSILLECTOMY      There were no vitals filed for this visit.  Subjective Assessment - 06/18/19 1104    Pertinent History  lacunar stroke w/ Rt hemiparesis 04/12/19. PMH: obesity, T2DM, HTN, HLD, ETOH abuse, chronic small vessel ischemic dz    Limitations  driving    Currently in Pain?  Yes   Rt knee - O.T. not addressing                  OT Treatments/Exercises (OP) - 06/18/19 0001      ADLs   Functional Mobility  Walking with plate of food in one hand and cup of water in the other with dynamic standing including bending over, squatting and head turns, then switching hands. Pt had 4 drops with Rt hand however smaller plate was used today      Hand Exercises   Other Hand Exercises  Gripper set at level 4 resistance to pick up blocks Rt hand w/ mod to max difficulty and more rest breaks needed today. Pt reports he slept on it wrong and it's been worse today.  Pt also practiced  pouring for control Rt hand with increased control than when last attempted      Visual/Perceptual Exercises   Scanning  Environmental    Scanning - Environmental  Pt performing environmental scanning in quieter env't than last session finding 100% of items while holding cup of water (2/3 full) in Rt hand with cues to prevent spills when reaching close to floor/bending over      Functional Reaching Activities   Low Level  Low level reaching while working on standing balance to place green to black resistance clothespins on antenna Rt hand (reaching towards floor for balance)               OT Short Term Goals - 06/06/19 1222      OT SHORT TERM GOAL #1   Title  Independent with RUE strengthening HEP    Time  4    Period  Weeks    Status  Achieved      OT SHORT TERM GOAL #2   Title  Independent with LE dressing using A/E PRN    Time  4    Period  Weeks    Status  Achieved  OT SHORT TERM GOAL #3   Title  Independent with coordination and putty HEP for Rt hand    Time  4    Period  Weeks    Status  Achieved      OT SHORT TERM GOAL #4   Title  Pt to perform 10 min or more of dynamic standing w/o rest or LOB    Time  4    Period  Weeks    Status  Achieved   15 minutes     OT SHORT TERM GOAL #5   Title  Improve coordination Rt hand as evidenced by reducing speed on 9 hole peg test to 50 sec. or less    Baseline  60.66 sec    Time  4    Period  Weeks    Status  Achieved   31.97 sec     OT SHORT TERM GOAL #6   Title  Pt to cut food w/ A/E prn safely    Time  4    Period  Weeks    Status  Achieved        OT Long Term Goals - 06/13/19 1140      OT LONG TERM GOAL #1   Title  Pt to perform full cooking tasks safely at mod I level w/ only 1 rest break    Time  8    Period  Weeks    Status  On-going      OT LONG TERM GOAL #2   Title  Pt to perform heavier cleaning tasks safely at mod I level w/ rest breaks prn (taking out trash, vacuuming, etc)    Time  8     Period  Weeks    Status  On-going   Pt reports taking out trash     OT LONG TERM GOAL #3   Title  Pt to improve coordination as evidenced by reducing speed on 9 hole peg test to 40 sec. or less Rt hand    Baseline  60.66 sec    Time  8    Period  Weeks    Status  Achieved   31.97 sec     OT LONG TERM GOAL #4   Title  Grip strength Rt hand to be 50 lbs or greater    Baseline  35.9 lbs    Time  8    Period  Weeks    Status  On-going   45 lbs     OT LONG TERM GOAL #5   Title  Pt to demo sufficient strength RUE to retrieve/replace 5 lb object from high shelf w/o pain and min compensations    Time  8    Period  Weeks    Status  Achieved      OT LONG TERM GOAL #6   Title  Pt to perform environmental scanning at 90% accuracy w/ simple physical task    Time  8    Period  Weeks    Status  On-going   currently 9/13 (70%)           Plan - 06/18/19 1221    Clinical Impression Statement  Pt progressing with dynamic standing balance and control of Rt hand/UE    Occupational performance deficits (Please refer to evaluation for details):  ADL's;IADL's;Work;Leisure    Body Structure / Function / Physical Skills  ADL;IADL;Balance;Body mechanics;Sensation;Mobility;Strength;Coordination;FMC;UE functional use;Decreased knowledge of use of DME    Rehab Potential  Good    Comorbidities impacting  occupational performance description:  obesity    OT Frequency  2x / week    OT Duration  8 weeks    OT Treatment/Interventions  Self-care/ADL training;Therapeutic exercise;Functional Mobility Training;Aquatic Therapy;Neuromuscular education;Manual Therapy;Therapeutic activities;Coping strategies training;DME and/or AE instruction;Passive range of motion;Patient/family education    Plan  continue RUE strength, coordination, dynamic standing       Patient will benefit from skilled therapeutic intervention in order to improve the following deficits and impairments:   Body Structure / Function  / Physical Skills: ADL, IADL, Balance, Body mechanics, Sensation, Mobility, Strength, Coordination, FMC, UE functional use, Decreased knowledge of use of DME       Visit Diagnosis: Hemiplegia and hemiparesis following cerebral infarction affecting right non-dominant side (HCC)  Muscle weakness (generalized)  Unsteadiness on feet  Other lack of coordination    Problem List Patient Active Problem List   Diagnosis Date Noted  . Hypertension, essential, benign 05/22/2019  . Morbid obesity (HCC) 05/22/2019  . DM (diabetes mellitus), type 2 with peripheral vascular complications (HCC) 04/24/2019  . Lacunar stroke (HCC) 04/12/2019    Kelli Churn, OTR/L 06/18/2019, 12:22 PM  [facility] [address] [place], [place], [address] Phone: [phone]   Fax:  [phone]  Name: Benjamin Rosales. MRN: [id number] Date of Birth: January 30, 1956

## 2019-06-19 ENCOUNTER — Encounter: Payer: Self-pay | Admitting: Family Medicine

## 2019-06-19 ENCOUNTER — Ambulatory Visit (INDEPENDENT_AMBULATORY_CARE_PROVIDER_SITE_OTHER): Payer: BC Managed Care – PPO | Admitting: Family Medicine

## 2019-06-19 ENCOUNTER — Telehealth: Payer: Self-pay | Admitting: Family Medicine

## 2019-06-19 VITALS — BP 130/70 | HR 73 | Temp 97.9°F | Resp 16 | Ht 67.0 in | Wt 286.5 lb

## 2019-06-19 DIAGNOSIS — I6381 Other cerebral infarction due to occlusion or stenosis of small artery: Secondary | ICD-10-CM

## 2019-06-19 DIAGNOSIS — K429 Umbilical hernia without obstruction or gangrene: Secondary | ICD-10-CM

## 2019-06-19 DIAGNOSIS — L03316 Cellulitis of umbilicus: Secondary | ICD-10-CM

## 2019-06-19 DIAGNOSIS — Z0279 Encounter for issue of other medical certificate: Secondary | ICD-10-CM

## 2019-06-19 NOTE — Telephone Encounter (Signed)
Form given to provider for completion

## 2019-06-19 NOTE — Progress Notes (Signed)
Chief Complaint  Patient presents with  . Need forms filled out to return to work   HPI: Benjamin.Benjamin Rosales. is a 64 y.o. male with history of hypertension, hyperlipidemia, CVA, and DM 2 who is here today with his male friend requesting form to be completed, so he can go back to work. A few weeks ago I completed a form and I recommended a date to be back to work. He is still doing PT after CVA, progressively getting better. He is walking with no assistance. Independent ADL's and ILDL's.  He works at [employer] as a Holiday representative. According to friend, he is not allowed to sit during work hours.  He was also evaluated on 06/12/2019 because of periumbilical cellulitis. He reported improvement. No history of trauma. Currently he is on topical antibiotics, mupirocin. He has not noted fever, chills, body aches, or changes in appetite.  Umbilical hernia, it does not cause him pain. Negative for changes in bowel habits, N/V, or blood in the stool. He has an appointment with surgeon in a few days.  Skin culture grew E. Coli and Enterococcus faecalis. He has not had prior history of umbilical infections.  Review of Systems  Constitutional: Negative for activity change and fatigue.  HENT: Negative for mouth sores and nosebleeds.   Respiratory: Negative for cough, shortness of breath and wheezing.   Cardiovascular: Negative for chest pain, palpitations and leg swelling.  Genitourinary: Negative for decreased urine volume, dysuria and hematuria.  Musculoskeletal: Positive for gait problem. Negative for myalgias.  Neurological: Negative for syncope, weakness and headaches.  Rest see pertinent positives and negatives per HPI.  Current Outpatient Medications on File Prior to Visit  Medication Sig Dispense Refill  . amLODipine (NORVASC) 10 MG tablet Take 1 tablet (10 mg total) by mouth daily. 30 tablet 11  . aspirin 81 MG chewable tablet Chew 1 tablet (81 mg total) by mouth daily.  30 tablet 11  . atorvastatin (LIPITOR) 40 MG tablet Take 1 tablet (40 mg total) by mouth daily at 6 PM. 30 tablet 11  . Cyanocobalamin (VITAMIN B12 PO) Take 1 tablet by mouth daily.    . folic acid (FOLVITE) 1 MG tablet TAKE 1 TABLET BY MOUTH EVERY DAY 90 tablet 2  . glucose blood (ONETOUCH VERIO) test strip Use to test 1-2 times daily. 200 each 12  . hydrALAZINE (APRESOLINE) 25 MG tablet Take 1 tablet (25 mg total) by mouth 3 (three) times daily. 90 tablet 5  . metFORMIN (GLUCOPHAGE) 500 MG tablet Take 1 tablet (500 mg total) by mouth 2 (two) times daily with a meal. 60 tablet 11  . Multiple Vitamin (MULTIVITAMIN) tablet Take 1 tablet by mouth daily.    . mupirocin ointment (BACTROBAN) 2 % Place 1 application into the nose 2 (two) times daily. 30 g 0  . thiamine 100 MG tablet Take 1 tablet (100 mg total) by mouth daily. 30 tablet 5  . hydrochlorothiazide (HYDRODIURIL) 12.5 MG tablet Take 2 tablets (25 mg total) by mouth daily. 180 tablet 1   No current facility-administered medications on file prior to visit.     Past Medical History:  Diagnosis Date  . Diabetes mellitus without complication (HCC)   . Hypercholesteremia   . Hypertension   . Stroke [facility]) 04/2019   No Known Allergies  Social History   Socioeconomic History  . Marital status: Single    Spouse name: Not on file  . Number of children: 0  .  Years of education: 60  . Highest education level: Not on file  Occupational History    Comment: NA  Tobacco Use  . Smoking status: Never Smoker  . Smokeless tobacco: Never Used  Substance and Sexual Activity  . Alcohol use: Not Currently    Alcohol/week: 28.0 standard drinks    Types: 28 Shots of liquor per week    Comment: 1 pint of liquor per day, 05/20/19 none  . Drug use: Never  . Sexual activity: Not on file  Other Topics Concern  . Not on file  Social History Narrative   Lives alone   Caffeine- none   Social Determinants of Health   Financial Resource Strain:    . Difficulty of Paying Living Expenses:   Food Insecurity:   . Worried About Programme researcher, broadcasting/film/video in the Last Year:   . Barista in the Last Year:   Transportation Needs:   . Freight forwarder (Medical):   Marland Kitchen Lack of Transportation (Non-Medical):   Physical Activity:   . Days of Exercise per Week:   . Minutes of Exercise per Session:   Stress:   . Feeling of Stress :   Social Connections:   . Frequency of Communication with Friends and Family:   . Frequency of Social Gatherings with Friends and Family:   . Attends Religious Services:   . Active Member of Clubs or Organizations:   . Attends Banker Meetings:   Marland Kitchen Marital Status:    Vitals:   06/19/19 1217  BP: 130/70  Pulse: 73  Resp: 16  Temp: 97.9 F (36.6 C)  SpO2: 97%   Body mass index is 44.87 kg/m.  Physical Exam  Nursing note and vitals reviewed. Constitutional: He is oriented to person, place, and time. He appears well-developed. No distress.  HENT:  Head: Normocephalic and atraumatic.  Eyes: Conjunctivae are normal.  Cardiovascular: Normal rate and regular rhythm.  No murmur heard. Respiratory: Effort normal and breath sounds normal. No respiratory distress.  GI: He exhibits no mass. There is no abdominal tenderness. A hernia is present.    Musculoskeletal:        General: No edema.  Neurological: He is alert and oriented to person, place, and time.  No focal deficit appreciated. Stable gait, mild limping, not assistance needed.  Skin: Skin is warm. There is erythema.  Erythema and moisture in skin fold above umbilicus. It is not tender, no purulent drainage,and no local heat.  Psychiatric: He has a normal mood and affect.  Well groomed, good eye contact.    ASSESSMENT AND PLAN:  Benjamin Rosales was seen today for need forms filled out to return to work.  Diagnoses and all orders for this visit:  Cellulitis of umbilicus Because problem is improving, recommend continue with topical  antibiotic. Instructed to keep area clean with soap and water. He has an appointment with surgeon in a few days.Culture grew E. coli and Enterococcus faecalis, will need to consider fecal contamination versus fistula.  Lacunar stroke [facility]) PT has really helped. No focal deficit appreciated. He is going to drop forms off to be completed. I think he can go back to work, part-time for a few weeks. I am going to recommend to have accommodation, so he can sit down during 4 hours if needed.  Continue adequate BP and glucose control to prevent further complications.  Umbilical hernia without obstruction and without gangrene Asymptomatic. He has an appointment with surgeon in a few  days.   Return if symptoms worsen or fail to improve, for Keep next appt.   Kaid Seeberger G. [place], MD  [facility]. [place] office.   A few things to remember from today's visit:  Keep umbilical area clean with soap and water. Keep appointment with surgeon. Please bring forms that need to be completed. Complete PT.  Please keep next follow-up appointment. Please be sure medication list is accurate. If a new problem present, please set up appointment sooner than planned today.

## 2019-06-19 NOTE — Patient Instructions (Signed)
A few things to remember from today's visit:   Keep umbilical area clean with soap and water. Keep appointment with surgeon. Please bring forms that need to be completed. Complete PT.  Please keep next follow-up appointment. Please be sure medication list is accurate. If a new problem present, please set up appointment sooner than planned today.

## 2019-06-19 NOTE — Telephone Encounter (Signed)
Pt came in and dropped off short term disability form Benjamin Rosales -[employer] to be completed by the provider that is expecting it.  Upon completion pt would like to be called [phone] to pick up a copy and a copy faxed to [phone] with case # referenced ([id number]).  Form was placed in providers folder for completion.

## 2019-06-20 ENCOUNTER — Ambulatory Visit: Payer: BC Managed Care – PPO | Admitting: Occupational Therapy

## 2019-06-20 ENCOUNTER — Other Ambulatory Visit: Payer: Self-pay

## 2019-06-20 ENCOUNTER — Ambulatory Visit: Payer: BC Managed Care – PPO

## 2019-06-20 VITALS — BP 168/88

## 2019-06-20 DIAGNOSIS — M6281 Muscle weakness (generalized): Secondary | ICD-10-CM

## 2019-06-20 DIAGNOSIS — I69353 Hemiplegia and hemiparesis following cerebral infarction affecting right non-dominant side: Secondary | ICD-10-CM

## 2019-06-20 DIAGNOSIS — R2689 Other abnormalities of gait and mobility: Secondary | ICD-10-CM

## 2019-06-20 NOTE — Therapy (Signed)
[facility] [address] [place], [place], [address] Phone: [phone]   Fax:  [phone]  Occupational Therapy Treatment  Patient Details  Name: Benjamin Rosales. MRN: [id number] Date of Birth: [birth year] No data recorded  Encounter Date: 06/20/2019  OT End of Session - 06/20/19 1133    Visit Number  13    Number of Visits  17    Date for OT Re-Evaluation  07/02/19    Authorization Type  BC/BS [employer] - 20 VL each discipline    Authorization - Visit Number  13    Authorization - Number of Visits  20    OT Start Time  1105    OT Stop Time  1145    OT Time Calculation (min)  40 min    Activity Tolerance  Patient tolerated treatment well    Behavior During Therapy  WFL for tasks assessed/performed       Past Medical History:  Diagnosis Date  . Diabetes mellitus without complication ([place])   . Hypercholesteremia   . Hypertension   . Stroke [facility]) 04/2019    Past Surgical History:  Procedure Laterality Date  . TONSILLECTOMY      There were no vitals filed for this visit.  Subjective Assessment - 06/20/19 1107    Subjective   I'm feeling it now (re: dynamic standing activity)    Pertinent History  lacunar stroke w/ Rt hemiparesis 04/12/19. PMH: obesity, T2DM, HTN, HLD, ETOH abuse, chronic small vessel ischemic dz    Limitations  driving    Currently in Pain?  Yes   Rt knee - O.T. not addressing     Standing on compliant surface for dynamic standing/balance to place large pegs in pegboard on vertical surface with RUE - pt performing diagonal patterns bilaterally for wt shifts, reaching outside BOS, and squats - pt w/ mod difficulty.  Functional ambulation while carrying laundry basket 200+ ft w/o LOB.  Pt placing grooved pegs in pegboard Rt hand w/ tweezers w/ max difficulty for coordination and pinch strength, w/o tweezers using Rt hand w/ min difficulty for coordination, then removing with tweezers.                         OT Short Term Goals - 06/06/19 1222      OT SHORT TERM GOAL #1   Title  Independent with RUE strengthening HEP    Time  4    Period  Weeks    Status  Achieved      OT SHORT TERM GOAL #2   Title  Independent with LE dressing using A/E PRN    Time  4    Period  Weeks    Status  Achieved      OT SHORT TERM GOAL #3   Title  Independent with coordination and putty HEP for Rt hand    Time  4    Period  Weeks    Status  Achieved      OT SHORT TERM GOAL #4   Title  Pt to perform 10 min or more of dynamic standing w/o rest or LOB    Time  4    Period  Weeks    Status  Achieved   15 minutes     OT SHORT TERM GOAL #5   Title  Improve coordination Rt hand as evidenced by reducing speed on 9 hole peg test to 50 sec. or less  Baseline  60.66 sec    Time  4    Period  Weeks    Status  Achieved   31.97 sec     OT SHORT TERM GOAL #6   Title  Pt to cut food w/ A/E prn safely    Time  4    Period  Weeks    Status  Achieved        OT Long Term Goals - 06/20/19 1144      OT LONG TERM GOAL #1   Title  Pt to perform full cooking tasks safely at mod I level w/ only 1 rest break    Time  8    Period  Weeks    Status  Achieved      OT LONG TERM GOAL #2   Title  Pt to perform heavier cleaning tasks safely at mod I level w/ rest breaks prn (taking out trash, vacuuming, etc)    Time  8    Period  Weeks    Status  Achieved   Pt reports taking out trash     OT LONG TERM GOAL #3   Title  Pt to improve coordination as evidenced by reducing speed on 9 hole peg test to 40 sec. or less Rt hand    Baseline  60.66 sec    Time  8    Period  Weeks    Status  Achieved   31.97 sec     OT LONG TERM GOAL #4   Title  Grip strength Rt hand to be 50 lbs or greater    Baseline  35.9 lbs    Time  8    Period  Weeks    Status  On-going   45 lbs     OT LONG TERM GOAL #5   Title  Pt to demo sufficient strength RUE to retrieve/replace 5 lb object  from high shelf w/o pain and min compensations    Time  8    Period  Weeks    Status  Achieved      OT LONG TERM GOAL #6   Title  Pt to perform environmental scanning at 90% accuracy w/ simple physical task    Time  8    Period  Weeks    Status  On-going   currently 9/13 (70%)           Plan - 06/20/19 1134    Clinical Impression Statement  Pt progressing with dynamic standing balance and control of Rt hand/UE. Pt has met LTG's #1 and #2.    Occupational performance deficits (Please refer to evaluation for details):  ADL's;IADL's;Work;Leisure    Body Structure / Function / Physical Skills  ADL;IADL;Balance;Body mechanics;Sensation;Mobility;Strength;Coordination;FMC;UE functional use;Decreased knowledge of use of DME    Rehab Potential  Good    Comorbidities impacting occupational performance description:  obesity    OT Frequency  2x / week    OT Duration  8 weeks    OT Treatment/Interventions  Self-care/ADL training;Therapeutic exercise;Functional Mobility Training;Aquatic Therapy;Neuromuscular education;Manual Therapy;Therapeutic activities;Coping strategies training;DME and/or AE instruction;Passive range of motion;Patient/family education    Plan  continue RUE strength, coordination, dynamic standing, anticipate d/c next week    Consulted and Agree with Plan of Care  Patient       Patient will benefit from skilled therapeutic intervention in order to improve the following deficits and impairments:   Body Structure / Function / Physical Skills: ADL, IADL, Balance, Body mechanics, Sensation, Mobility, Strength, Coordination, FMC,  UE functional use, Decreased knowledge of use of DME       Visit Diagnosis: Hemiplegia and hemiparesis following cerebral infarction affecting right non-dominant side (HCC)  Muscle weakness (generalized)    Problem List Patient Active Problem List   Diagnosis Date Noted  . Hypertension, essential, benign 05/22/2019  . Morbid obesity ([place])  05/22/2019  . DM (diabetes mellitus), type 2 with peripheral vascular complications ([place]) 83/16/7425  . Lacunar stroke ([place]) 04/12/2019    Carey Bullocks, OTR/L 06/20/2019, 11:45 AM  [facility] [address] [place], [place], [address] Phone: [phone]   Fax:  [phone]  Name: Benjamin Rosales. MRN: [id number] Date of Birth: [birth year]

## 2019-06-20 NOTE — Therapy (Signed)
[facility] [address] [place], [place], [address] Phone: [phone]   Fax:  [phone]  Physical Therapy Treatment  Patient Details  Name: Benjamin Rosales. MRN: [id number] Date of Birth: 06/05/1955 Referring Provider (PT): Referred by Pecola Leisure but will be following up with Dr. Leta Baptist   Encounter Date: 06/20/2019  PT End of Session - 06/20/19 1016    Visit Number  11    Number of Visits  17    Date for PT Re-Evaluation  07/01/19    Authorization Type  FOTO,   BCBS 20 PT visits but additional visits can be requested by patient through their nurse care coordinator    PT Start Time  1014    PT Stop Time  1057    PT Time Calculation (min)  43 min    Equipment Utilized During Treatment  Gait belt    Activity Tolerance  Patient tolerated treatment well    Behavior During Therapy  [facility] for tasks assessed/performed       Past Medical History:  Diagnosis Date  . Diabetes mellitus without complication ([place])   . Hypercholesteremia   . Hypertension   . Stroke [facility]) 04/2019    Past Surgical History:  Procedure Laterality Date  . TONSILLECTOMY      Vitals:   06/20/19 1015  BP: (!) 168/88    Subjective Assessment - 06/20/19 1015    Subjective  Pt saw PCP yesterday who cleared him to resume work part time but would need to be allowed to sit but patient going to surgeon about hernia Friday so that may change if needs to get operated on soon. Pt feels he will be good to finish next week and continue on own without adding more time.    Pertinent History  Symptoms started 3 days prior to going to hospital on 04/12/19 with right side weakness, slurred speech. Found to have acute lacunar infarct. Likely new onset DM.PMH: morbid obesity, HTN, alcholol abuse, hyperlipidemia    Patient Stated Goals  Pt would like to improve strength on right side and improve stamina.    Currently in Pain?  Yes    Pain Score  1      Pain Location  Knee    Pain Orientation  Right    Pain Descriptors / Indicators  Aching    Pain Type  Chronic pain                       [facility] Adult PT Treatment/Exercise - 06/20/19 1019      Transfers   Transfers  Sit to Stand;Stand to Sit    Sit to Stand  7: Independent    Stand to Sit  7: Independent    Comments  30 sec sit to stand from chair without arms=12      Ambulation/Gait   Ambulation/Gait  Yes    Ambulation/Gait Assistance  5: Supervision    Gait Pattern  Step-through pattern;Decreased step length - right;Decreased step length - left    Gait velocity  12.47 sec=0.50ms    Gait Comments  Pt ambulated on treadmill at 0.982m x 8 min with verbal cues to increase step length. Pt's BP checked at that point and BP=198/88.  Had patient sit and rest for 5 min then rechecked and was 142/82.      Neuro Re-ed    Neuro Re-ed Details   Pt performed standing next to // bar: on 2x4" foam 1  min x 2 with CGA/min assist as occasional LOB posterior; standing on rockerboard positioned ant/post maintaining level x 30 sec eyes open then 30 sec eyes closed CGA with increased sway then with reaching for targets x 30 sec. Pt had to touch occasionally with left hand on bar. Repeated with board lateral. Pt reported soreness in ankles/calves with board ant/post and in hips with lateral.             PT Education - 06/20/19 1334    Education Details  Pt to continue with current HEP    Person(s) Educated  Patient    Methods  Explanation    Comprehension  Verbalized understanding       PT Short Term Goals - 06/20/19 1052      PT SHORT TERM GOAL #1   Title  Pt will be independent with initial HEP for strengthening and balance to continue gains on own.    Baseline  met    Status  Achieved      PT SHORT TERM GOAL #2   Title  Pt's balance/gait will be further assessed with FGA testing and LTG written.    Baseline  05/08/19: met in session today with score of 11/30. Primary  PT to set goals.    Status  Achieved      PT SHORT TERM GOAL #3   Title  6 min walk test will be performed to further assess gait and activity tolerance to get baseline for LTG.    Baseline  05/08/19: met in session today with distance of 735 feet no AD, one brief standing rest break. Primary PT to set goals.    Status  Achieved      PT SHORT TERM GOAL #4   Title  Pt will increase 30 sec sit to stand from 11 reps to 13 or more reps from standard chair for improved functional strength.    Baseline  10 reps on 3/29, w/o use of UE, improved just not to goal level.    Status  Partially Met      PT SHORT TERM GOAL #5   Title  Pt will report <6/10 pain in right knee with activities for improved function.    Baseline  met    Status  Achieved        PT Long Term Goals - 06/20/19 1056      PT LONG TERM GOAL #1   Title  Pt will be independent with progressive HEP for strength, balance and functional mobility to continue gains on own.    Time  8    Period  Weeks    Status  New      PT LONG TERM GOAL #2   Title  Pt will increase gait speed from 0.70ms to >0.733m for improved gait safety in community.    Baseline  0.56837mon 05/02/19, 0.37m/73mn 06/20/19    Time  8    Period  Weeks    Status  Achieved      PT LONG TERM GOAL #3   Title  Pt will decrease TUG from 18.88 sec to <15 sec for improved balance and functional strength.    Baseline  18.88sec on 05/02/19    Time  8    Period  Weeks    Status  New      PT LONG TERM GOAL #4   Title  Pt will ambulate >500' on varied surfaces independently for safe community distances.    Time  8  Period  Weeks    Status  New      PT LONG TERM GOAL #5   Title  Pt will increase 6 min walk distance from 735' to 935' for improved gait and activity tolerance.    Baseline  735' on 05/08/19 with 1 standing rest break    Time  8    Period  Weeks    Status  Revised      PT LONG TERM GOAL #6   Title  Pt will ambulate up/down 4 steps without railing  independently to safely enter/exit home.    Time  8    Period  Weeks    Status  New      PT LONG TERM GOAL #7   Title  Pt will increase FGA from 11/30 to 16/30 or more for improved balance and gait.    Baseline  11/30 on 05/08/19    Time  8    Period  Weeks    Status  New            Plan - 06/20/19 1334    Clinical Impression Statement  Pt continues to show improved activity tolerance with longer gait times and on treadmill today. BP was elevated after walking but returned back down to safe reading after 5 min break. Pt to continue to monitor. He was asymptomatic. Pt was challenged with compliant surfaces for balance working more on ankle and hip strategy.    Personal Factors and Comorbidities  Comorbidity 3+;Social Background    Comorbidities  morbid obesity, HTN, alcholol abuse, hyperlipidemia    Examination-Activity Limitations  Locomotion Level;Stand;Stairs    Examination-Participation Restrictions  Yard Work;Community Activity   job   Stability/Clinical Decision Making  Evolving/Moderate complexity    Rehab Potential  Good    PT Frequency  2x / week    PT Duration  8 weeks    PT Treatment/Interventions  ADLs/Self Care Home Management;Cryotherapy;Moist Heat;Therapeutic exercise;Therapeutic activities;Functional mobility training;Stair training;Gait training;DME Instruction;Balance training;Neuromuscular re-education;Patient/family education;Manual techniques;Passive range of motion;Energy conservation    PT Next Visit Plan  How did visit with surgeon go? continue to work on LE strengthening, balance reactions on compliant surfaces, activity tolerance and gait on various surfaces. Pt hopes to return to work, will need to be able to stand for prolonged periods of time (is a Tourist information centre manager at [employer])    [place]: [id number]    Consulted and Agree with Plan of Care  Patient;Other (Comment)   girlfriend      Patient will benefit from skilled therapeutic  intervention in order to improve the following deficits and impairments:  Abnormal gait, Cardiopulmonary status limiting activity, Decreased activity tolerance, Decreased balance, Decreased endurance, Decreased coordination, Decreased strength, Pain  Visit Diagnosis: Other abnormalities of gait and mobility  Muscle weakness (generalized)     Problem List Patient Active Problem List   Diagnosis Date Noted  . Hypertension, essential, benign 05/22/2019  . Morbid obesity ([place]) 05/22/2019  . DM (diabetes mellitus), type 2 with peripheral vascular complications ([place]) 93/79/0240  . Lacunar stroke ([place]) 04/12/2019    Electa Sniff, PT, DPT, NCS 06/20/2019, 1:37 PM  [facility] [address] [place], [place], [address] Phone: [phone]   Fax:  [phone]  Name: Mussa Groesbeck. MRN: [id number] Date of Birth: [birth year]

## 2019-06-21 ENCOUNTER — Other Ambulatory Visit: Payer: Self-pay | Admitting: Surgery

## 2019-06-21 ENCOUNTER — Other Ambulatory Visit (HOSPITAL_COMMUNITY): Payer: Self-pay | Admitting: Surgery

## 2019-06-21 DIAGNOSIS — K420 Umbilical hernia with obstruction, without gangrene: Secondary | ICD-10-CM

## 2019-06-21 NOTE — Telephone Encounter (Signed)
Form given to provider with note from wife.

## 2019-06-21 NOTE — Telephone Encounter (Signed)
Form faxed to [facility], left vm informing [name] that their copy for pick up is ready and at the front desk.

## 2019-06-21 NOTE — Telephone Encounter (Signed)
Benjamin Rosales came by to pick up form and left the form with a note:  Dr. [place],  Benjamin Rosales saw surgeon today and will get a CT scan next week to determine the hernia and if this will need immediate attention. I fel like he should delay return to work due to the unsure resolution. Could we please amend the return to work for at least 2 more weeks so we can do what surgeon determines is needed. I apologize for the forms and timing.  Benjamin Rosales  [phone]

## 2019-06-21 NOTE — Telephone Encounter (Signed)
Formed placed at front desk for pick-up, no changes were made by Dr. [place].

## 2019-06-21 NOTE — Telephone Encounter (Signed)
I put the form and the note in the Dr's Folder

## 2019-06-21 NOTE — Telephone Encounter (Signed)
Returned call to patient and his wife and gave recommendations per Dr. [place] that he can return to work part-time on Monday, 06/24/2019 and that if the surgeon feels that he need to be out longer due to the hernia, they will have to complete FMLA forms. Patient and wife, Benjamin Rosales verbalized understanding.

## 2019-06-24 ENCOUNTER — Other Ambulatory Visit: Payer: Self-pay

## 2019-06-24 ENCOUNTER — Ambulatory Visit (HOSPITAL_COMMUNITY)
Admission: RE | Admit: 2019-06-24 | Discharge: 2019-06-24 | Disposition: A | Discharge status: Home / Self Care | Payer: BC Managed Care – PPO | Source: Ambulatory Visit | Attending: Surgery | Admitting: Surgery

## 2019-06-24 ENCOUNTER — Encounter (HOSPITAL_COMMUNITY): Payer: Self-pay

## 2019-06-24 DIAGNOSIS — K420 Umbilical hernia with obstruction, without gangrene: Secondary | ICD-10-CM | POA: Diagnosis not present

## 2019-06-24 LAB — POCT I-STAT CREATININE: Creatinine, Ser: 0.9 mg/dL (ref 0.61–1.24)

## 2019-06-24 MED ORDER — IOHEXOL 300 MG/ML  SOLN
100.0000 mL | Freq: Once | INTRAMUSCULAR | Status: AC | PRN
  Administered 2019-06-24: 100 mL via INTRAVENOUS

## 2019-06-24 MED ORDER — SODIUM CHLORIDE (PF) 0.9 % IJ SOLN
INTRAMUSCULAR | Status: AC
  Filled 2019-06-24: qty 50

## 2019-06-25 ENCOUNTER — Ambulatory Visit: Payer: BC Managed Care – PPO

## 2019-06-25 ENCOUNTER — Ambulatory Visit: Payer: BC Managed Care – PPO | Admitting: Occupational Therapy

## 2019-06-25 DIAGNOSIS — R2681 Unsteadiness on feet: Secondary | ICD-10-CM

## 2019-06-25 DIAGNOSIS — I69353 Hemiplegia and hemiparesis following cerebral infarction affecting right non-dominant side: Secondary | ICD-10-CM

## 2019-06-25 DIAGNOSIS — R2689 Other abnormalities of gait and mobility: Secondary | ICD-10-CM

## 2019-06-25 DIAGNOSIS — M6281 Muscle weakness (generalized): Secondary | ICD-10-CM

## 2019-06-25 NOTE — Therapy (Signed)
[facility] [address] [place], [place], [address] Phone: [phone]   Fax:  [phone]  Physical Therapy Treatment  Patient Details  Name: Benjamin Rosales. MRN: [id number] Date of Birth: 1955/10/21 Referring Provider (PT): Referred by Pecola Leisure but will be following up with Dr. Leta Baptist   Encounter Date: 06/25/2019  PT End of Session - 06/25/19 1019    Visit Number  12    Number of Visits  17    Date for PT Re-Evaluation  07/01/19    Authorization Type  FOTO,   BCBS 20 PT visits but additional visits can be requested by patient through their nurse care coordinator    PT Start Time  1017    PT Stop Time  1057    PT Time Calculation (min)  40 min    Equipment Utilized During Treatment  Gait belt    Activity Tolerance  Patient tolerated treatment well    Behavior During Therapy  [facility] for tasks assessed/performed       Past Medical History:  Diagnosis Date  . Diabetes mellitus without complication ([place])   . Hypercholesteremia   . Hypertension   . Stroke [facility]) 04/2019    Past Surgical History:  Procedure Laterality Date  . TONSILLECTOMY      There were no vitals filed for this visit.  Subjective Assessment - 06/25/19 1019    Subjective  Pt saw surgeon on Friday who ordered a CT that he had yesterday. Found that he has 2 hernias but has not gotten the results from surgeon yet.    Pertinent History  Symptoms started 3 days prior to going to hospital on 04/12/19 with right side weakness, slurred speech. Found to have acute lacunar infarct. Likely new onset DM.PMH: morbid obesity, HTN, alcholol abuse, hyperlipidemia    Patient Stated Goals  Pt would like to improve strength on right side and improve stamina.    Currently in Pain?  Yes    Pain Location  Knee    Pain Orientation  Right    Pain Descriptors / Indicators  Aching    Pain Type  Chronic pain         [facility] PT Assessment - 06/25/19  1023      6 Minute walk- Post Test   BP (mmHg)  142/84    HR (bpm)  94    Modified Borg Scale for Dyspnea  2- Mild shortness of breath      6 minute walk test results    Aerobic Endurance Distance Walked  875                   [facility] Adult PT Treatment/Exercise - 06/25/19 1023      Transfers   Comments  30 sec sit to stand=14 reps      Ambulation/Gait   Ambulation/Gait  Yes    Ambulation/Gait Assistance  7: Independent;5: Supervision    Ambulation/Gait Assistance Details  Pt was independent on level surface but supervision on grass. Pt continues to have right trendelenberg.    Ambulation Distance (Feet)  850 Feet    Assistive device  None    Gait Pattern  Step-through pattern;Decreased stance time - right;Antalgic;Trendelenburg   right trendelenberg   Ambulation Surface  Level;Unlevel;Indoor;Outdoor;Paved;Grass    Gait Comments  Pt ambulated with SPC at end of session 345' with verbal cues for sequencing. Pt was noted to have decreased trendelenberg with use of cane but did have some trouble with  sequencing at times.             PT Education - 06/25/19 1929    Education Details  Discussed d/c versus extending care next visit if he wished to continue as showing progress still.    Person(s) Educated  Patient    Methods  Explanation       PT Short Term Goals - 06/25/19 1025      PT SHORT TERM GOAL #1   Title  Pt will be independent with initial HEP for strengthening and balance to continue gains on own.    Baseline  met    Status  Achieved      PT SHORT TERM GOAL #2   Title  Pt's balance/gait will be further assessed with FGA testing and LTG written.    Baseline  05/08/19: met in session today with score of 11/30. Primary PT to set goals.    Status  Achieved      PT SHORT TERM GOAL #3   Title  6 min walk test will be performed to further assess gait and activity tolerance to get baseline for LTG.    Baseline  05/08/19: met in session today with distance  of 735 feet no AD, one brief standing rest break. Primary PT to set goals.    Status  Achieved      PT SHORT TERM GOAL #4   Title  Pt will increase 30 sec sit to stand from 11 reps to 13 or more reps from standard chair for improved functional strength.    Baseline  10 reps on 3/29, w/o use of UE, improved just not to goal level. 06/25/19 14 reps    Status  Achieved      PT SHORT TERM GOAL #5   Title  Pt will report <6/10 pain in right knee with activities for improved function.    Baseline  met    Status  Achieved        PT Long Term Goals - 06/25/19 1037      PT LONG TERM GOAL #1   Title  Pt will be independent with progressive HEP for strength, balance and functional mobility to continue gains on own.    Time  8    Period  Weeks    Status  New      PT LONG TERM GOAL #2   Title  Pt will increase gait speed from 0.74ms to >0.739m for improved gait safety in community.    Baseline  0.5674mon 05/02/19, 0.69m/11mn 06/20/19    Time  8    Period  Weeks    Status  Achieved      PT LONG TERM GOAL #3   Title  Pt will decrease TUG from 18.88 sec to <15 sec for improved balance and functional strength.    Baseline  18.88sec on 05/02/19    Time  8    Period  Weeks    Status  New      PT LONG TERM GOAL #4   Title  Pt will ambulate >500' on varied surfaces independently for safe community distances.    Time  8    Period  Weeks    Status  New      PT LONG TERM GOAL #5   Title  Pt will increase 6 min walk distance from 735' to 935' for improved gait and activity tolerance.    Baseline  735' on 05/08/19 with 1 standing rest break. 06/25/19 875'  Time  8    Period  Weeks    Status  Partially Met      PT LONG TERM GOAL #6   Title  Pt will ambulate up/down 4 steps without railing independently to safely enter/exit home.    Time  8    Period  Weeks    Status  New      PT LONG TERM GOAL #7   Title  Pt will increase FGA from 11/30 to 16/30 or more for improved balance and gait.     Baseline  11/30 on 05/08/19    Time  8    Period  Weeks    Status  New            Plan - 06/25/19 1932    Clinical Impression Statement  Pt met 30 sec sit to stand goal today showing improving functional strength. Pt was able to increase distance on 6 min walk but just short of goal. PT trialed SPC today to try to decrease right trendelenberg with gait and it did help but patient needs some work on [place].    Personal Factors and Comorbidities  Comorbidity 3+;Social Background    Comorbidities  morbid obesity, HTN, alcholol abuse, hyperlipidemia    Examination-Activity Limitations  Locomotion Level;Stand;Stairs    Examination-Participation Restrictions  Yard Work;Community Activity   job   Stability/Clinical Decision Making  Evolving/Moderate complexity    Rehab Potential  Good    PT Frequency  2x / week    PT Duration  8 weeks    PT Treatment/Interventions  ADLs/Self Care Home Management;Cryotherapy;Moist Heat;Therapeutic exercise;Therapeutic activities;Functional mobility training;Stair training;Gait training;DME Instruction;Balance training;Neuromuscular re-education;Patient/family education;Manual techniques;Passive range of motion;Energy conservation    PT Next Visit Plan  Did pt hear back from surgeon? Do we need to extend or does patient wish to stop. Practice gait with SPC to see if patient wishes to purchase one for longer distances. continue to work on LE strengthening, balance reactions on compliant surfaces, activity tolerance and gait on various surfaces. Pt hopes to return to work, will need to be able to stand for prolonged periods of time (is a Tourist information centre manager at [employer])    [place]: [id number]    Consulted and Agree with Plan of Care  Patient;Other (Comment)   girlfriend      Patient will benefit from skilled therapeutic intervention in order to improve the following deficits and impairments:  Abnormal gait, Cardiopulmonary status limiting activity,  Decreased activity tolerance, Decreased balance, Decreased endurance, Decreased coordination, Decreased strength, Pain  Visit Diagnosis: Other abnormalities of gait and mobility  Muscle weakness (generalized)     Problem List Patient Active Problem List   Diagnosis Date Noted  . Hypertension, essential, benign 05/22/2019  . Morbid obesity ([place]) 05/22/2019  . DM (diabetes mellitus), type 2 with peripheral vascular complications ([place]) 03/70/4888  . Lacunar stroke ([place]) 04/12/2019    Electa Sniff, PT, DPT, NCS 06/25/2019, 7:35 PM  [facility] [address] [place], [place], [address] Phone: [phone]   Fax:  [phone]  Name: Nicolaos Mitrano. MRN: [id number] Date of Birth: [birth year]

## 2019-06-25 NOTE — Therapy (Signed)
[facility] [address] [place], [place], [address] Phone: [phone]   Fax:  [phone]  Occupational Therapy Treatment  Patient Details  Name: Benjamin Rosales. MRN: [id number] Date of Birth: January 24, 1956 No data recorded  Encounter Date: 06/25/2019  OT End of Session - 06/25/19 1144    Visit Number  14    Number of Visits  17    Date for OT Re-Evaluation  07/02/19    Authorization Type  BC/BS [employer] - 20 VL each discipline    Authorization - Visit Number  14    Authorization - Number of Visits  20    OT Start Time  1100    OT Stop Time  1145    OT Time Calculation (min)  45 min    Activity Tolerance  Patient tolerated treatment well    Behavior During Therapy  WFL for tasks assessed/performed       Past Medical History:  Diagnosis Date  . Diabetes mellitus without complication ([place])   . Hypercholesteremia   . Hypertension   . Stroke [facility]) 04/2019    Past Surgical History:  Procedure Laterality Date  . TONSILLECTOMY      There were no vitals filed for this visit.  Subjective Assessment - 06/25/19 1106    Pertinent History  lacunar stroke w/ Rt hemiparesis 04/12/19. PMH: obesity, T2DM, HTN, HLD, ETOH abuse, chronic small vessel ischemic dz    Limitations  driving    Currently in Pain?  Yes   Rt knee - see P.T. note      Grip strength Rt hand 35 lbs today (when last assessed was 45 lbs) Gripper set at level 4 resistance but max difficulty and more rest breaks required - pt did not complete d/t difficulty (therapist offered to change resistance to level 3 but pt declined) Standing on compliant surface with diagonal reaching bilaterally reaching outside BOS for wt shifts and dynamic standing balance.  Ambulating holding cup of water Rt hand while reaching to pick up items off floor Lt hand w/o spills or drops Rt hand.  UBE x 5 min, level 5 resistance.                        OT Short Term Goals - 06/06/19 1222      OT SHORT TERM GOAL #1   Title  Independent with RUE strengthening HEP    Time  4    Period  Weeks    Status  Achieved      OT SHORT TERM GOAL #2   Title  Independent with LE dressing using A/E PRN    Time  4    Period  Weeks    Status  Achieved      OT SHORT TERM GOAL #3   Title  Independent with coordination and putty HEP for Rt hand    Time  4    Period  Weeks    Status  Achieved      OT SHORT TERM GOAL #4   Title  Pt to perform 10 min or more of dynamic standing w/o rest or LOB    Time  4    Period  Weeks    Status  Achieved   15 minutes     OT SHORT TERM GOAL #5   Title  Improve coordination Rt hand as evidenced by reducing speed on 9 hole peg test to 50 sec. or less  Baseline  60.66 sec    Time  4    Period  Weeks    Status  Achieved   31.97 sec     OT SHORT TERM GOAL #6   Title  Pt to cut food w/ A/E prn safely    Time  4    Period  Weeks    Status  Achieved        OT Long Term Goals - 06/20/19 1144      OT LONG TERM GOAL #1   Title  Pt to perform full cooking tasks safely at mod I level w/ only 1 rest break    Time  8    Period  Weeks    Status  Achieved      OT LONG TERM GOAL #2   Title  Pt to perform heavier cleaning tasks safely at mod I level w/ rest breaks prn (taking out trash, vacuuming, etc)    Time  8    Period  Weeks    Status  Achieved   Pt reports taking out trash     OT LONG TERM GOAL #3   Title  Pt to improve coordination as evidenced by reducing speed on 9 hole peg test to 40 sec. or less Rt hand    Baseline  60.66 sec    Time  8    Period  Weeks    Status  Achieved   31.97 sec     OT LONG TERM GOAL #4   Title  Grip strength Rt hand to be 50 lbs or greater    Baseline  35.9 lbs    Time  8    Period  Weeks    Status  On-going   45 lbs     OT LONG TERM GOAL #5   Title  Pt to demo sufficient strength RUE to retrieve/replace 5 lb object  from high shelf w/o pain and min compensations    Time  8    Period  Weeks    Status  Achieved      OT LONG TERM GOAL #6   Title  Pt to perform environmental scanning at 90% accuracy w/ simple physical task    Time  8    Period  Weeks    Status  On-going   currently 9/13 (70%)           Plan - 06/25/19 1144    Clinical Impression Statement  Pt had decreased grip strength today Rt hand (35 lbs). Pt progressing with dynamic standing balance and control of RT hand    Occupational performance deficits (Please refer to evaluation for details):  ADL's;IADL's;Work;Leisure    Body Structure / Function / Physical Skills  ADL;IADL;Balance;Body mechanics;Sensation;Mobility;Strength;Coordination;FMC;UE functional use;Decreased knowledge of use of DME    Rehab Potential  Good    Comorbidities impacting occupational performance description:  obesity    OT Frequency  2x / week    OT Duration  8 weeks    OT Treatment/Interventions  Self-care/ADL training;Therapeutic exercise;Functional Mobility Training;Aquatic Therapy;Neuromuscular education;Manual Therapy;Therapeutic activities;Coping strategies training;DME and/or AE instruction;Passive range of motion;Patient/family education    Plan  reassess grip strength, assess remaining goal and d/c next session    Consulted and Agree with Plan of Care  Patient       Patient will benefit from skilled therapeutic intervention in order to improve the following deficits and impairments:   Body Structure / Function / Physical Skills: ADL, IADL, Balance, Body mechanics, Sensation, Mobility,  Strength, Coordination, FMC, UE functional use, Decreased knowledge of use of DME       Visit Diagnosis: Hemiplegia and hemiparesis following cerebral infarction affecting right non-dominant side (HCC)  Muscle weakness (generalized)  Unsteadiness on feet    Problem List Patient Active Problem List   Diagnosis Date Noted  . Hypertension, essential, benign  05/22/2019  . Morbid obesity ([facility]) 05/22/2019  . DM (diabetes mellitus), type 2 with peripheral vascular complications ([facility]) 04/24/2019  . Lacunar stroke ([facility]) 04/12/2019    Kelli Churn, OTR/L 06/25/2019, 11:49 AM  [facility] [address] [place], [place], [address] Phone: [phone]   Fax:  [phone]  Name: Benjamin Rosales. MRN: [id number] Date of Birth: 1955/09/01

## 2019-06-27 ENCOUNTER — Other Ambulatory Visit: Payer: Self-pay

## 2019-06-27 ENCOUNTER — Ambulatory Visit: Payer: BC Managed Care – PPO | Admitting: Occupational Therapy

## 2019-06-27 ENCOUNTER — Ambulatory Visit: Payer: BC Managed Care – PPO

## 2019-06-27 DIAGNOSIS — I69353 Hemiplegia and hemiparesis following cerebral infarction affecting right non-dominant side: Secondary | ICD-10-CM

## 2019-06-27 DIAGNOSIS — R278 Other lack of coordination: Secondary | ICD-10-CM

## 2019-06-27 DIAGNOSIS — R2681 Unsteadiness on feet: Secondary | ICD-10-CM

## 2019-06-27 DIAGNOSIS — R2689 Other abnormalities of gait and mobility: Secondary | ICD-10-CM

## 2019-06-27 DIAGNOSIS — M6281 Muscle weakness (generalized): Secondary | ICD-10-CM

## 2019-06-27 NOTE — Therapy (Signed)
[facility] [address] [place], [place], [address] Phone: [phone]   Fax:  [phone]  Physical Therapy Treatment/Discharge Summary  Patient Details  Name: Benjamin Rosales. MRN: [id number] Date of Birth: Dec 12, 1955 Referring Provider (PT): Referred by Pecola Leisure but will be following up with Dr. Leta Baptist  PHYSICAL THERAPY DISCHARGE SUMMARY  Visits from Start of Care: 13  Current functional level related to goals / functional outcomes: See clinical impression and goals for more information. Pt is ambulating community distances independently.   Remaining deficits: Right knee pain with certain activities, right LE weakness   Education / Equipment: HEP  Plan: Patient agrees to discharge.  Patient goals were met. Patient is being discharged due to being pleased with the current functional level.  ?????     2  Encounter Date: 06/27/2019  PT End of Session - 06/27/19 1019    Visit Number  13    Number of Visits  17    Date for PT Re-Evaluation  07/01/19    Authorization Type  FOTO,   BCBS 20 PT visits but additional visits can be requested by patient through their nurse care coordinator    PT Start Time  1015    PT Stop Time  1057    PT Time Calculation (min)  42 min    Equipment Utilized During Treatment  Gait belt    Activity Tolerance  Patient tolerated treatment well    Behavior During Therapy  WFL for tasks assessed/performed       Past Medical History:  Diagnosis Date  . Diabetes mellitus without complication ([place])   . Hypercholesteremia   . Hypertension   . Stroke [facility]) 04/2019    Past Surgical History:  Procedure Laterality Date  . TONSILLECTOMY      There were no vitals filed for this visit.  Subjective Assessment - 06/27/19 1017    Subjective  Pt reports that he has surgeon appointment scheduled for the 10th and has to see if neurologist will clear him for surgery if they  do decide to proceed with repairing both hernias.  Pt frustrated that he may have to start back to work and then leave a short while later. Pt feels good with stopping PT today as feels he has come a long way and can continue to work on exercises he has been given.    Pertinent History  Symptoms started 3 days prior to going to hospital on 04/12/19 with right side weakness, slurred speech. Found to have acute lacunar infarct. Likely new onset DM.PMH: morbid obesity, HTN, alcholol abuse, hyperlipidemia    Patient Stated Goals  Pt would like to improve strength on right side and improve stamina.    Currently in Pain?  Yes    Pain Score  1     Pain Location  Knee    Pain Orientation  Right    Pain Descriptors / Indicators  Aching         [facility] PT Assessment - 06/27/19 1026      Functional Gait  Assessment   Gait assessed   Yes    Gait Level Surface  Walks 20 ft in less than 7 sec but greater than 5.5 sec, uses assistive device, slower speed, mild gait deviations, or deviates 6-10 in outside of the 12 in walkway width.    Change in Gait Speed  Able to smoothly change walking speed without loss of balance or gait deviation. Deviate no more than  6 in outside of the 12 in walkway width.    Gait with Horizontal Head Turns  Performs head turns smoothly with no change in gait. Deviates no more than 6 in outside 12 in walkway width    Gait with Vertical Head Turns  Performs head turns with no change in gait. Deviates no more than 6 in outside 12 in walkway width.    Gait and Pivot Turn  Pivot turns safely within 3 sec and stops quickly with no loss of balance.    Step Over Obstacle  Is able to step over one shoe box (4.5 in total height) but must slow down and adjust steps to clear box safely. May require verbal cueing.    Gait with Narrow Base of Support  Is able to ambulate for 10 steps heel to toe with no staggering.    Gait with Eyes Closed  Walks 20 ft, uses assistive device, slower speed, mild gait  deviations, deviates 6-10 in outside 12 in walkway width. Ambulates 20 ft in less than 9 sec but greater than 7 sec.    Ambulating Backwards  Walks 20 ft, uses assistive device, slower speed, mild gait deviations, deviates 6-10 in outside 12 in walkway width.    Steps  Two feet to a stair, must use rail.    Total Score  23                   [facility] Adult PT Treatment/Exercise - 06/27/19 1026      Ambulation/Gait   Ambulation/Gait  Yes    Ambulation/Gait Assistance  7: Independent;6: Modified independent (Device/Increase time)    Ambulation/Gait Assistance Details  Pt was given cues for sequencing with cane. Did a lap with no cane as well independent just increased right trendelenberg. With cane patient was cued to work on arm swing.    Ambulation Distance (Feet)  575 Feet    Assistive device  None;Straight cane    Gait Pattern  Step-through pattern    Ambulation Surface  Level;Indoor    Stairs  Yes    Stairs Assistance  6: Modified independent (Device/Increase time)    Stair Management Technique  One rail Right;Step to pattern    Number of Stairs  8    Curb  6: Modified independent (Device/increase time)    Gait Comments  On stairs pt holds to wall at home. Mimicked on steps with lateral steps going down first 4 then tried forward leading with LLE. Pt ambulated up/down stairs with SPC and 1 rail in step-to pattern mod I. Up/down curb with SPC mod I with cues for sequencing. Pt reported he did feel more confident on curb with cane.      Standardized Balance Assessment   Standardized Balance Assessment  Timed Up and Go Test      Timed Up and Go Test   TUG  Normal TUG    Normal TUG (seconds)  10.63      Exercises   Exercises  Other Exercises    Other Exercises   Pt performed sidelying clamshell x 10 with red theraband, sidelying right hip abd x 10 with red theraband.  Reviewed remaining medbridge exercise verbally with patient denying any questions.             PT  Education - 06/27/19 1223    Education Details  Plan for discharge. HEP to continue with.    Person(s) Educated  Patient    Methods  Explanation    Comprehension  Verbalized understanding       PT Short Term Goals - 06/25/19 1025      PT SHORT TERM GOAL #1   Title  Pt will be independent with initial HEP for strengthening and balance to continue gains on own.    Baseline  met    Status  Achieved      PT SHORT TERM GOAL #2   Title  Pt's balance/gait will be further assessed with FGA testing and LTG written.    Baseline  05/08/19: met in session today with score of 11/30. Primary PT to set goals.    Status  Achieved      PT SHORT TERM GOAL #3   Title  6 min walk test will be performed to further assess gait and activity tolerance to get baseline for LTG.    Baseline  05/08/19: met in session today with distance of 735 feet no AD, one brief standing rest break. Primary PT to set goals.    Status  Achieved      PT SHORT TERM GOAL #4   Title  Pt will increase 30 sec sit to stand from 11 reps to 13 or more reps from standard chair for improved functional strength.    Baseline  10 reps on 3/29, w/o use of UE, improved just not to goal level. 06/25/19 14 reps    Status  Achieved      PT SHORT TERM GOAL #5   Title  Pt will report <6/10 pain in right knee with activities for improved function.    Baseline  met    Status  Achieved        PT Long Term Goals - 06/27/19 1021      PT LONG TERM GOAL #1   Title  Pt will be independent with progressive HEP for strength, balance and functional mobility to continue gains on own.    Time  8    Period  Weeks    Status  Achieved      PT LONG TERM GOAL #2   Title  Pt will increase gait speed from 0.20ms to >0.762m for improved gait safety in community.    Baseline  0.5613mon 05/02/19, 0.20m/47mn 06/20/19    Time  8    Period  Weeks    Status  Achieved      PT LONG TERM GOAL #3   Title  Pt will decrease TUG from 18.88 sec to <15 sec for  improved balance and functional strength.    Baseline  18.88sec on 05/02/19, 06/27/19 TUG=10.63 sec    Time  8    Period  Weeks    Status  Achieved      PT LONG TERM GOAL #4   Title  Pt will ambulate >500' on varied surfaces independently for safe community distances.    Time  8    Period  Weeks    Status  Achieved      PT LONG TERM GOAL #5   Title  Pt will increase 6 min walk distance from 735' to 935' for improved gait and activity tolerance.    Baseline  735' on 05/08/19 with 1 standing rest break. 06/25/19 875'    Time  8    Period  Weeks    Status  Partially Met      PT LONG TERM GOAL #6   Title  Pt will ambulate up/down 4 steps without railing independently to safely enter/exit home.    Time  8    Period  Weeks    Status  Partially Met      PT LONG TERM GOAL #7   Title  Pt will increase FGA from 11/30 to 16/30 or more for improved balance and gait.    Baseline  11/30 on 05/08/19, 23/30 on 06/27/19    Time  8    Period  Weeks    Status  Achieved            Plan - 06/27/19 1224    Clinical Impression Statement  PT reassessed remaining goals today as patient wanting to proceed with potential discharge as feels he has made a lot of progress and can continue to work on exercises at home. Pt met FGA goal today with significant improvement to 23/30 showing improving balance. Pt has showed improvement in strength in right hip with being able to perform sidelying hip abduction with resistance now. He does continue to have trendlenberg with right stance which may be partly due to hip weakness but also due to right knee issues. PT did suggest cane for longer distances as this helped correct this lean and patient is going to consider this. Pt is able to walk community distances independently. Pt is independent with home program and will continue on his own.    Personal Factors and Comorbidities  Comorbidity 3+;Social Background    Comorbidities  morbid obesity, HTN, alcholol abuse,  hyperlipidemia    Examination-Activity Limitations  Locomotion Level;Stand;Stairs    Examination-Participation Restrictions  Yard Work;Community Activity   job   Stability/Clinical Decision Making  Evolving/Moderate complexity    Rehab Potential  Good    PT Frequency  2x / week    PT Duration  8 weeks    PT Treatment/Interventions  ADLs/Self Care Home Management;Cryotherapy;Moist Heat;Therapeutic exercise;Therapeutic activities;Functional mobility training;Stair training;Gait training;DME Instruction;Balance training;Neuromuscular re-education;Patient/family education;Manual techniques;Passive range of motion;Energy conservation    PT Next Visit Plan  Discharged today    PT Home Exercise Plan  Access Code: [email or web address]    Consulted and Agree with Plan of Care  Patient;Other (Comment)   girlfriend      Patient will benefit from skilled therapeutic intervention in order to improve the following deficits and impairments:  Abnormal gait, Cardiopulmonary status limiting activity, Decreased activity tolerance, Decreased balance, Decreased endurance, Decreased coordination, Decreased strength, Pain  Visit Diagnosis: Other abnormalities of gait and mobility  Muscle weakness (generalized)     Problem List Patient Active Problem List   Diagnosis Date Noted  . Hypertension, essential, benign 05/22/2019  . Morbid obesity ([place]) 05/22/2019  . DM (diabetes mellitus), type 2 with peripheral vascular complications ([place]) 72/53/6644  . Lacunar stroke ([place]) 04/12/2019    Electa Sniff, PT, DPT, NCS 06/27/2019, 12:30 PM  [facility] [address] [place], [place], [address] Phone: [phone]   Fax:  [phone]  Name: Samarion Ehle. MRN: [id number] Date of Birth: [birth year]

## 2019-06-27 NOTE — Therapy (Signed)
[facility] [address] [place], [place], [address] Phone: [phone]   Fax:  [phone]  Occupational Therapy Treatment  Patient Details  Name: Benjamin Rosales. MRN: [id number] Date of Birth: 11/27/1955 No data recorded  Encounter Date: 06/27/2019  OT End of Session - 06/27/19 1139    Visit Number  15    Number of Visits  17    Date for OT Re-Evaluation  07/02/19    Authorization Type  BC/BS [employer] - 20 VL each discipline    Authorization - Visit Number  15    Authorization - Number of Visits  20    OT Start Time  1324    OT Stop Time  1145    OT Time Calculation (min)  40 min    Activity Tolerance  Patient tolerated treatment well    Behavior During Therapy  WFL for tasks assessed/performed       Past Medical History:  Diagnosis Date  . Diabetes mellitus without complication ([place])   . Hypercholesteremia   . Hypertension   . Stroke [facility]) 04/2019    Past Surgical History:  Procedure Laterality Date  . TONSILLECTOMY      There were no vitals filed for this visit.  Subjective Assessment - 06/27/19 1105    Pertinent History  lacunar stroke w/ Rt hemiparesis 04/12/19. PMH: obesity, T2DM, HTN, HLD, ETOH abuse, chronic small vessel ischemic dz    Limitations  driving    Currently in Pain?  Yes   Rt knee - OT not addressing                  OT Treatments/Exercises (OP) - 06/27/19 0001      ADLs   ADL Comments  Assessed remaining goals and progress to date - pt has met all STG's and all but 1 LTG - Grip strength has decreased slightly.       Shoulder Exercises: ROM/Strengthening   UBE (Upper Arm Bike)  x 10 min, level 5 resistance (5 min forward, 5 min backwards)       Hand Exercises   Other Hand Exercises  Pt placing O'Connor pegs in pegboard 3 rows w/ tweezers min difficulty then removing w/o difficulty      Visual/Perceptual Exercises   Scanning  Environmental    Scanning -  Environmental  Pt found 13/14 items (93% accuracy) on 1st pass in moderately distracting environment while holding cup of water Rt hand w/o spills. Pt found remaining item on 2nd pass.                OT Short Term Goals - 06/06/19 1222      OT SHORT TERM GOAL #1   Title  Independent with RUE strengthening HEP    Time  4    Period  Weeks    Status  Achieved      OT SHORT TERM GOAL #2   Title  Independent with LE dressing using A/E PRN    Time  4    Period  Weeks    Status  Achieved      OT SHORT TERM GOAL #3   Title  Independent with coordination and putty HEP for Rt hand    Time  4    Period  Weeks    Status  Achieved      OT SHORT TERM GOAL #4   Title  Pt to perform 10 min or more of dynamic standing w/o rest or  LOB    Time  4    Period  Weeks    Status  Achieved   15 minutes     OT SHORT TERM GOAL #5   Title  Improve coordination Rt hand as evidenced by reducing speed on 9 hole peg test to 50 sec. or less    Baseline  60.66 sec    Time  4    Period  Weeks    Status  Achieved   31.97 sec     OT SHORT TERM GOAL #6   Title  Pt to cut food w/ A/E prn safely    Time  4    Period  Weeks    Status  Achieved        OT Long Term Goals - 06/27/19 1106      OT LONG TERM GOAL #1   Title  Pt to perform full cooking tasks safely at mod I level w/ only 1 rest break    Time  8    Period  Weeks    Status  Achieved      OT LONG TERM GOAL #2   Title  Pt to perform heavier cleaning tasks safely at mod I level w/ rest breaks prn (taking out trash, vacuuming, etc)    Time  8    Period  Weeks    Status  Achieved   Pt reports taking out trash     OT LONG TERM GOAL #3   Title  Pt to improve coordination as evidenced by reducing speed on 9 hole peg test to 40 sec. or less Rt hand    Baseline  60.66 sec    Time  8    Period  Weeks    Status  Achieved   31.97 sec     OT LONG TERM GOAL #4   Title  Grip strength Rt hand to be 50 lbs or greater    Baseline  35.9  lbs    Time  8    Period  Weeks    Status  Not Met   35-45 lbs     OT LONG TERM GOAL #5   Title  Pt to demo sufficient strength RUE to retrieve/replace 5 lb object from high shelf w/o pain and min compensations    Time  8    Period  Weeks    Status  Achieved      OT LONG TERM GOAL #6   Title  Pt to perform environmental scanning at 90% accuracy w/ simple physical task    Time  8    Period  Weeks    Status  Achieved   93-100%           Plan - 06/27/19 1140    Clinical Impression Statement  Pt has met all STG's and 5/6 LTG's. Pt has had slight decrease in grip strength and pain at Rt elbow only with tight gripping. Pt otherwise back to PLOF    Occupational performance deficits (Please refer to evaluation for details):  ADL's;IADL's;Work;Leisure    Body Structure / Function / Physical Skills  ADL;IADL;Balance;Body mechanics;Sensation;Mobility;Strength;Coordination;FMC;UE functional use;Decreased knowledge of use of DME    Rehab Potential  Good    Comorbidities impacting occupational performance description:  obesity    OT Frequency  2x / week    OT Duration  8 weeks    OT Treatment/Interventions  Self-care/ADL training;Therapeutic exercise;Functional Mobility Training;Aquatic Therapy;Neuromuscular education;Manual Therapy;Therapeutic activities;Coping strategies training;DME and/or AE instruction;Passive range of motion;Patient/family  education    Plan  D/C O.T.    Consulted and Agree with Plan of Care  Patient       Patient will benefit from skilled therapeutic intervention in order to improve the following deficits and impairments:   Body Structure / Function / Physical Skills: ADL, IADL, Balance, Body mechanics, Sensation, Mobility, Strength, Coordination, FMC, UE functional use, Decreased knowledge of use of DME       Visit Diagnosis: Hemiplegia and hemiparesis following cerebral infarction affecting right non-dominant side (HCC)  Muscle weakness  (generalized)  Unsteadiness on feet  Other lack of coordination    Problem List Patient Active Problem List   Diagnosis Date Noted  . Hypertension, essential, benign 05/22/2019  . Morbid obesity ([place]) 05/22/2019  . DM (diabetes mellitus), type 2 with peripheral vascular complications ([place]) 91/03/8900  . Lacunar stroke ([place]) 04/12/2019    OCCUPATIONAL THERAPY DISCHARGE SUMMARY  Visits from Start of Care: 15  Current functional level related to goals / functional outcomes: SEE ABOVE   Remaining deficits: Endurance Rt grip strength   Education / Equipment: HEP's  Plan: Patient agrees to discharge.  Patient goals were met. Patient is being discharged due to meeting the stated rehab goals.  ?????        Floyde, Dingley, OTR/L 06/27/2019, 11:42 AM  [facility] [address] [place], [place], [address] Phone: [phone]   Fax:  [phone]  Name: Benjamin Rosales. MRN: [id number] Date of Birth: 1955/11/30

## 2019-07-01 ENCOUNTER — Ambulatory Visit: Payer: BC Managed Care – PPO | Attending: Internal Medicine

## 2019-07-01 DIAGNOSIS — Z23 Encounter for immunization: Secondary | ICD-10-CM

## 2019-07-01 NOTE — Progress Notes (Signed)
   [id number]  Name:  Benjamin Rosales.    MRN: [id number] DOB: November 22, 1955  07/01/2019  [name] was observed post Covid-19 immunization for 15 minutes without incident. He was provided with Vaccine Information Sheet and instruction to access the V-Safe system.   [name] was instructed to call 911 with any severe reactions post vaccine: Marland Kitchen Difficulty breathing  . Swelling of face and throat  . A fast heartbeat  . A bad rash all over body  . Dizziness and weakness   Immunizations Administered    Name Date Dose VIS Date Route   Pfizer COVID-19 Vaccine 07/01/2019 11:48 AM 0.3 mL 04/24/2018 Intramuscular   Manufacturer: [employer], [employer]   Lot: [id number]   NDC: [address]-7

## 2019-07-08 ENCOUNTER — Other Ambulatory Visit: Payer: Self-pay | Admitting: Surgery

## 2019-07-23 ENCOUNTER — Other Ambulatory Visit: Payer: Self-pay | Admitting: Surgery

## 2019-08-12 ENCOUNTER — Other Ambulatory Visit: Payer: Self-pay

## 2019-08-12 ENCOUNTER — Telehealth: Payer: Self-pay

## 2019-08-13 ENCOUNTER — Encounter: Payer: Self-pay | Admitting: Family Medicine

## 2019-08-13 ENCOUNTER — Ambulatory Visit (INDEPENDENT_AMBULATORY_CARE_PROVIDER_SITE_OTHER): Payer: BC Managed Care – PPO | Admitting: Family Medicine

## 2019-08-13 VITALS — BP 130/80 | HR 95 | Temp 98.8°F | Resp 12 | Ht 67.0 in | Wt 273.2 lb

## 2019-08-13 DIAGNOSIS — E1151 Type 2 diabetes mellitus with diabetic peripheral angiopathy without gangrene: Secondary | ICD-10-CM | POA: Diagnosis not present

## 2019-08-13 DIAGNOSIS — E1169 Type 2 diabetes mellitus with other specified complication: Secondary | ICD-10-CM

## 2019-08-13 DIAGNOSIS — I10 Essential (primary) hypertension: Secondary | ICD-10-CM | POA: Diagnosis not present

## 2019-08-13 DIAGNOSIS — R0989 Other specified symptoms and signs involving the circulatory and respiratory systems: Secondary | ICD-10-CM

## 2019-08-13 DIAGNOSIS — E785 Hyperlipidemia, unspecified: Secondary | ICD-10-CM

## 2019-08-13 DIAGNOSIS — K429 Umbilical hernia without obstruction or gangrene: Secondary | ICD-10-CM

## 2019-08-13 LAB — POCT GLYCOSYLATED HEMOGLOBIN (HGB A1C): Hemoglobin A1C: 6.1 % — AB (ref 4.0–5.6)

## 2019-08-13 NOTE — Assessment & Plan Note (Signed)
HgA1C at goal. No changes in current management. Regular exercise and healthy diet with avoidance of added sugar food intake is an important part of treatment and recommended. Annual eye exam, periodic dental and foot care recommended. F/U in 5-6 months  

## 2019-08-13 NOTE — Progress Notes (Addendum)
HPI:  [name] is a 64 y.o. male, who is here today with his friend for 4 months follow up.  Friend helps with answering questions.  He was last seen on 06/19/19 for acute visit. Since his last visit he has seen surgeon for hernia evaluation. An incarcerated inguinal hernia that contents part of his bladder was seen on imaging. Urologist evaluation was recommended and planning on surgical treatment with general surgeon and urologist in a few weeks.  Negative for abdominal pain,N/V,or blood in stool.  06/24/19 abdomen/pelvis ML:[id number] umbilical hernia containing mid transverse colon. No evidence of bowel obstruction. Moderate to large right inguinal hernia containing a portion of the urinary bladder. Aortic atherosclerosis.  DM II:Dx'ed in 04/2019. He is on Metformin 500 mg bid. Tolerating medication well.  BS's low 110's.  04/13/19 HgA1C was 6.8. Negative for polydipsia,polyuria, or polyphagia.  Lab Results  Component Value Date   MICROALBUR 1.2 04/30/2019   Hypertension:  Currently on Amlodipine 10 mg daily,Hydralazine 25 mg tid,and HCTZ 25 mg daily.  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 Just got a BP monitor 2 days ago, BP 150-160/80's. He is taking medications as instructed, no side effects reported. He has not noted unusual headache, visual changes, exertional chest pain, dyspnea,new focal weakness, or edema.  Lab Results  Component Value Date   CREATININE 0.90 06/24/2019   BUN 11 04/14/2019   NA 138 04/14/2019   K 3.9 04/14/2019   CL 103 04/14/2019   CO2 26 04/14/2019   HLD: He is on Atorvastatin 40 mg daily. Tolerating medication well.  Lab Results  Component Value Date   CHOL 230 (H) 04/13/2019   HDL 49 04/13/2019   LDLCALC 145 (H) 04/13/2019   TRIG 181 (H) 04/13/2019   CHOLHDL 4.7 04/13/2019   I am being asked to complete another form, this is the 3rd one. He was supposed to be back to work part time but according to pt, after a week he was  not allowed to work because the could not provide a chair for him to sit as needed. He worked until 07/13/19. Ongoing PT, most goals achieved. Independent ADL's and IADL's. Residual slight decreased in grip strength right UE.  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  Review of Systems  Constitutional: Negative for activity change, appetite change, fatigue and fever.  HENT: Negative for mouth sores, nosebleeds and sore throat.   Eyes: Negative for redness and visual disturbance.  Respiratory: Negative for cough and wheezing.   Gastrointestinal:       No changes in bowel habits.  Genitourinary: Negative for decreased urine volume and hematuria.  Skin: Negative for rash and wound.  Neurological: Negative for syncope and facial asymmetry.  Rest of ROS, see pertinent positives sand negatives in HPI  Current Outpatient Medications on File Prior to Visit  Medication Sig Dispense Refill  . amLODipine (NORVASC) 10 MG tablet Take 1 tablet (10 mg total) by mouth daily. 30 tablet 11  . aspirin 81 MG chewable tablet Chew 1 tablet (81 mg total) by mouth daily. 30 tablet 11  . Cyanocobalamin (VITAMIN B12 PO) Take 1 tablet by mouth daily.    . folic acid (FOLVITE) 1 MG tablet TAKE 1 TABLET BY MOUTH EVERY DAY (Patient taking differently: Take 1 mg by mouth daily. ) 90 tablet 2  . glucose blood (ONETOUCH VERIO) test strip Use to test 1-2 times daily. 200 each 12  . hydrALAZINE (APRESOLINE)  25 MG tablet Take 1 tablet (25 mg total) by mouth 3 (three) times daily. 90 tablet 5  . metFORMIN (GLUCOPHAGE) 500 MG tablet Take 1 tablet (500 mg total) by mouth 2 (two) times daily with a meal. 60 tablet 11  . Multiple Vitamin (MULTIVITAMIN) tablet Take 1 tablet by mouth daily.    . mupirocin ointment (BACTROBAN) 2 % Place 1 application into the nose 2 (two) times daily. 30 g 0  . thiamine 100 MG tablet Take 1 tablet (100 mg total) by mouth daily. 30 tablet 5   No current  facility-administered medications on file prior to visit.   Past Medical History:  Diagnosis Date  . Alcohol abuse   . Decreased pedal pulses    Right foot  . Diabetes mellitus without complication ([place])   . Hypercholesteremia   . Hypertension   . Obesity   . Right inguinal hernia   . Stroke ([place]) 04/2019   left pontine ischemic infarction  . Umbilical hernia    No Known Allergies  Social History   Socioeconomic History  . Marital status: Single    Spouse name: Not on file  . Number of children: 0  . Years of education: 69  . Highest education level: Not on file  Occupational History    Comment: NA  Tobacco Use  . Smoking status: Never Smoker  . Smokeless tobacco: Never Used  Vaping Use  . Vaping Use: Never used  Substance and Sexual Activity  . Alcohol use: Not Currently    Alcohol/week: 28.0 standard drinks    Types: 28 Shots of liquor per week    Comment: 1 pint of liquor per day, 05/20/19 none  . Drug use: Never  . Sexual activity: Not on file  Other Topics Concern  . Not on file  Social History Narrative   Lives alone   Caffeine- none   Social Determinants of Health   Financial Resource Strain:   . Difficulty of Paying Living Expenses:   Food Insecurity:   . Worried About Charity fundraiser in the Last Year:   . Arboriculturist in the Last Year:   Transportation Needs:   . Film/video editor (Medical):   Marland Kitchen Lack of Transportation (Non-Medical):   Physical Activity:   . Days of Exercise per Week:   . Minutes of Exercise per Session:   Stress:   . Feeling of Stress :   Social Connections:   . Frequency of Communication with Friends and Family:   . Frequency of Social Gatherings with Friends and Family:   . Attends Religious Services:   . Active Member of Clubs or Organizations:   . Attends Archivist Meetings:   Marland Kitchen Marital Status:     Vitals:   08/13/19 1205  BP: 130/80  Pulse: 95  Resp: 12  Temp: 98.8 F (37.1 C)  SpO2: 95%     Body mass index is 42.8 kg/m.  Physical Exam Nursing note reviewed.  Constitutional:      General: He is not in acute distress.    Appearance: Normal appearance. He is well-developed.  HENT:     Head: Normocephalic and atraumatic.  Eyes:     Conjunctiva/sclera: Conjunctivae normal.     Pupils: Pupils are equal, round, and reactive to light.  Cardiovascular:     Rate and Rhythm: Normal rate and regular rhythm.     Heart sounds: No murmur heard.      Comments: DP pulses present,decreased.  Normal capillary refill. Pulmonary:     Effort: Pulmonary effort is normal. No respiratory distress.     Breath sounds: Normal breath sounds.  Abdominal:     Palpations: Abdomen is soft. There is no hepatomegaly or mass.     Tenderness: There is no abdominal tenderness.     Hernia: A hernia is present. Hernia is present in the umbilical area and right inguinal area.  Lymphadenopathy:     Cervical: No cervical adenopathy.  Skin:    General: Skin is warm.     Findings: No erythema or rash.  Neurological:     Mental Status: He is alert and oriented to person, place, and time.     Comments: Otherwise stable gait with no assistance. Slight weakness RUE, rest negative for focal deficit.  Psychiatric:        Mood and Affect: Mood and affect normal.     Comments: Well groomed, good eye contact.   Physical Exam  Nursing note reviewed. Constitutional: He is oriented to person, place, and time. He appears well-developed. No distress.  HENT:  Head: Normocephalic and atraumatic.  Eyes: Pupils are equal, round, and reactive to light. Conjunctivae are normal.  Cardiovascular: Normal rate and regular rhythm.  No murmur heard. Pulses:      Dorsalis pedis pulses are 2+ on the right side.  DP pulses present,decreased. Normal capillary refill.  Respiratory: Effort normal and breath sounds normal. No respiratory distress.  GI: Soft. Normal appearance. He exhibits no mass. There is no hepatomegaly.  There is no abdominal tenderness. A hernia is present. Hernia confirmed positive in the umbilical area and confirmed positive in the right inguinal area.  Lymphadenopathy:    He has no cervical adenopathy.  Neurological: He is alert and oriented to person, place, and time.  Otherwise stable gait with no assistance. Slight weakness RUE, rest negative for focal deficit.  Skin: Skin is warm. No rash noted. No erythema.  Psychiatric: Mood and affect normal.  Well groomed, good eye contact.   ASSESSMENT AND PLAN:   Mr. Benjamin Rosales. was seen today for 4 months follow-up.  Orders Placed This Encounter  Procedures  . Comprehensive metabolic panel  . Lipid panel  . POC HgB A1c  . VAS [place] ABI WITH/WO TBI   Lab Results  Component Value Date   HGBA1C 6.1 (A) 08/13/2019   Lab Results  Component Value Date   CREATININE 0.81 08/14/2019   BUN 12 08/14/2019   NA 137 08/14/2019   K 3.9 08/14/2019   CL 101 08/14/2019   CO2 28 08/14/2019   Lab Results  Component Value Date   ALT 18 08/14/2019   AST 12 08/14/2019   ALKPHOS 101 08/14/2019   BILITOT 0.7 08/14/2019    DM (diabetes mellitus), type 2 with peripheral vascular complications (HCC) HgA1C at goal. No changes in current management. Regular exercise and healthy diet with avoidance of added sugar food intake is an important part of treatment and recommended. Annual eye exam, periodic dental and foot care recommended. F/U in 5-6 months   Hyperlipidemia associated with type 2 diabetes mellitus (HCC) Continue Atorvastatin 40 mg daily and low fat diet. Further recommendations according to FLP results.Istructed to go to [facility] in the next few days for fasting labs.  Hypertension, essential, benign BP adequately controlled today. No changes in current management. Continue low-salt diet. Start monitoring BP regularly, goal < 140/90, ideally 130/80.  Umbilical hernia without obstruction and without  gangrene Asymptomatic. Pending surgical  treatment, 08/29/19.  Decreased pulses in feet Adequate foot care to continue. Negative for claudication. ABI will be arranged.  He will be dropping off new form that needs to be completed ,so he can return to work. I think he can go back tomorrow, so instructed to do so. He and his friend voice understanding and agree with plan.  He will need another FMLA completed by surgeon after surgery.   Return in about 6 months (around 02/12/2020) for DM II,HTN,HLD.   Martia Dalby G. [place], MD  [facility]. [place] office.  A few things to remember from today's visit:   No changes today. You can go back to work tomorrow.  If you need refills please call your pharmacy. Do not use My Chart to request refills or for acute issues that need immediate attention.    Please be sure medication list is accurate. If a new problem present, please set up appointment sooner than planned today.

## 2019-08-13 NOTE — Telephone Encounter (Signed)
Wife informed at ov that paperwork has been received and on provider's desk for review.

## 2019-08-13 NOTE — Patient Instructions (Signed)
A few things to remember from today's visit:   No changes today. You can go back to work tomorrow.  If you need refills please call your pharmacy. Do not use My Chart to request refills or for acute issues that need immediate attention.    Please be sure medication list is accurate. If a new problem present, please set up appointment sooner than planned today.

## 2019-08-13 NOTE — Assessment & Plan Note (Addendum)
BP adequately controlled today. No changes in current management. Continue low-salt diet. Start monitoring BP regularly, goal < 140/90, ideally 130/80.

## 2019-08-13 NOTE — Assessment & Plan Note (Signed)
Continue Atorvastatin 40 mg daily and low fat diet. Further recommendations according to FLP results.Istructed to go to [facility] in the next few days for fasting labs.

## 2019-08-14 ENCOUNTER — Telehealth: Payer: Self-pay | Admitting: Family Medicine

## 2019-08-14 ENCOUNTER — Ambulatory Visit: Payer: BC Managed Care – PPO | Admitting: Family Medicine

## 2019-08-14 ENCOUNTER — Other Ambulatory Visit (INDEPENDENT_AMBULATORY_CARE_PROVIDER_SITE_OTHER): Payer: BC Managed Care – PPO

## 2019-08-14 DIAGNOSIS — E1169 Type 2 diabetes mellitus with other specified complication: Secondary | ICD-10-CM | POA: Diagnosis not present

## 2019-08-14 DIAGNOSIS — E1151 Type 2 diabetes mellitus with diabetic peripheral angiopathy without gangrene: Secondary | ICD-10-CM | POA: Diagnosis not present

## 2019-08-14 DIAGNOSIS — I10 Essential (primary) hypertension: Secondary | ICD-10-CM

## 2019-08-14 DIAGNOSIS — E785 Hyperlipidemia, unspecified: Secondary | ICD-10-CM | POA: Diagnosis not present

## 2019-08-14 LAB — COMPREHENSIVE METABOLIC PANEL
ALT: 18 U/L (ref 0–53)
AST: 12 U/L (ref 0–37)
Albumin: 4.5 g/dL (ref 3.5–5.2)
Alkaline Phosphatase: 101 U/L (ref 39–117)
BUN: 12 mg/dL (ref 6–23)
CO2: 28 mEq/L (ref 19–32)
Calcium: 9.5 mg/dL (ref 8.4–10.5)
Chloride: 101 mEq/L (ref 96–112)
Creatinine, Ser: 0.81 mg/dL (ref 0.40–1.50)
GFR: 96.61 mL/min (ref >60.00)
Glucose, Bld: 145 mg/dL — ABNORMAL HIGH (ref 70–99)
Potassium: 3.9 mEq/L (ref 3.5–5.1)
Sodium: 137 mEq/L (ref 135–145)
Total Bilirubin: 0.7 mg/dL (ref 0.2–1.2)
Total Protein: 7.1 g/dL (ref 6.0–8.3)

## 2019-08-14 LAB — LIPID PANEL
Cholesterol: 114 mg/dL (ref 0–200)
HDL: 41.8 mg/dL (ref >39.00)
LDL Cholesterol: 58 mg/dL (ref 0–99)
NonHDL: 72.2
Total CHOL/HDL Ratio: 3
Triglycerides: 71 mg/dL (ref 0.0–149.0)
VLDL: 14.2 mg/dL (ref 0.0–40.0)

## 2019-08-14 NOTE — Telephone Encounter (Signed)
Form placed on providers desk for completion

## 2019-08-14 NOTE — Telephone Encounter (Signed)
Back to Work Certification to be filled out- date needs to reflect 09/22/19 per patient.  Call [phone] upon completion.

## 2019-08-16 ENCOUNTER — Encounter (HOSPITAL_COMMUNITY): Payer: Self-pay

## 2019-08-16 DIAGNOSIS — Z0279 Encounter for issue of other medical certificate: Secondary | ICD-10-CM

## 2019-08-16 NOTE — Patient Instructions (Addendum)
DUE TO COVID-19 ONLY ONE VISITOR ARE ALLOWED TO COME WITH YOU AND STAY IN THE WAITING ROOM ONLY DURING PRE OP AND PROCEDURE. THEN TWO VISITORS MAY VISIT WITH YOU IN YOUR PRIVATE ROOM DURING VISITING HOURS ONLY!!   COVID SWAB TESTING MUST BE COMPLETED ON:  Monday, August 26, 2019 at 1:15 PM     [address] [place] [facility] enter pre surgical testing line (Must self quarantine after testing. Follow instructions on handout.)             Your procedure is scheduled on: Thursday, August 29, 2019   Report to [facility] Main  Entrance   Report to Short Stay at 5:30 AM   [facility])   Call this number if you have problems the morning of surgery [phone]   Do not eat food  :After Midnight.   May have liquids until 4:30 AM day of surgery   CLEAR LIQUID DIET  Foods Allowed                                                                     Foods Excluded  Water, Black Coffee and tea, regular and decaf                             liquids that you cannot  Plain Jell-O in any flavor  (No red)                                           see through such as: Fruit ices (not with fruit pulp)                                     milk, soups, orange juice  Iced Popsicles (No red)                                    All solid food                                   Apple juices Sports drinks like Gatorade (No red) Lightly seasoned clear broth or consume(fat free) Sugar, honey syrup  Sample Menu Breakfast                                Lunch                                     Supper Cranberry juice                    Beef broth                            Chicken  broth Jell-O                                     Grape juice                           Apple juice Coffee or tea                        Jell-O                                      Popsicle                                                Coffee or tea                        Coffee or tea    Complete  one G2 drink the morning of surgery at 4:30 AM the day of surgery.   Oral Hygiene is also important to reduce your risk of infection.                                    Remember - BRUSH YOUR TEETH THE MORNING OF SURGERY WITH YOUR REGULAR TOOTHPASTE   Do NOT smoke after Midnight   Take these medicines the morning of surgery with A SIP OF WATER: Amlodipine, Hydralazine  DO NOT TAKE ANY ORAL DIABETIC MEDICATIONS DAY OF YOUR SURGERY                               You may not have any metal on your body including  jewelry, and body piercings             Do not wear lotions, powders, perfumes/cologne, or deodorant                          Men may shave face and neck.   Do not bring valuables to the hospital. [facility] IS NOT             RESPONSIBLE   FOR VALUABLES.   Contacts, dentures or bridgework may not be worn into surgery.    Patients discharged the day of surgery will not be allowed to drive home.   Special Instructions: Bring a copy of your healthcare power of attorney and living will documents         the day of surgery if you haven't scanned them in before.              Please read over the following fact sheets you were given: IF YOU HAVE QUESTIONS ABOUT YOUR PRE OP INSTRUCTIONS PLEASE CALL [phone]  How to Manage Your Diabetes Before and After Surgery  Why is it important to control my blood sugar before and after surgery? . Improving blood sugar levels before and after surgery helps healing and can limit problems. . A way of improving blood sugar control is eating a healthy  diet by: o  Eating less sugar and carbohydrates o  Increasing activity/exercise o  Talking with your doctor about reaching your blood sugar goals . High blood sugars (greater than 180 mg/dL) can raise your risk of infections and slow your recovery, so you will need to focus on controlling your diabetes during the weeks before surgery. . Make sure that the doctor who takes care of your diabetes  knows about your planned surgery including the date and location.  How do I manage my blood sugar before surgery? . Check your blood sugar at least 4 times a day, starting 2 days before surgery, to make sure that the level is not too high or low. o Check your blood sugar the morning of your surgery when you wake up and every 2 hours until you get to the Short Stay unit. . If your blood sugar is less than 70 mg/dL, you will need to treat for low blood sugar: o Do not take insulin. o Treat a low blood sugar (less than 70 mg/dL) with  cup of clear juice (cranberry or apple), 4 glucose tablets, OR glucose gel. o Recheck blood sugar in 15 minutes after treatment (to make sure it is greater than 70 mg/dL). If your blood sugar is not greater than 70 mg/dL on recheck, call [phone] for further instructions. . Report your blood sugar to the short stay nurse when you get to Short Stay.  . If you are admitted to the hospital after surgery: o Your blood sugar will be checked by the staff and you will probably be given insulin after surgery (instead of oral diabetes medicines) to make sure you have good blood sugar levels. o The goal for blood sugar control after surgery is 80-180 mg/dL.   WHAT DO I DO ABOUT MY DIABETES MEDICATION?  Marland Kitchen Do not take oral diabetes medicines (pills) the morning of surgery.  Reviewed and Endorsed by [facility] Patient Education Committee, August 2015   [facility] - Preparing for Surgery Before surgery, you can play an important role.  Because skin is not sterile, your skin needs to be as free of germs as possible.  You can reduce the number of germs on your skin by washing with CHG (chlorahexidine gluconate) soap before surgery.  CHG is an antiseptic cleaner which kills germs and bonds with the skin to continue killing germs even after washing. Please DO NOT use if you have an allergy to CHG or antibacterial soaps.  If your skin becomes reddened/irritated stop using the  CHG and inform your nurse when you arrive at Short Stay. Do not shave (including legs and underarms) for at least 48 hours prior to the first CHG shower.  You may shave your face/neck.  Please follow these instructions carefully:  1.  Shower with CHG Soap the night before surgery and the  morning of surgery.  2.  If you choose to wash your hair, wash your hair first as usual with your normal  shampoo.  3.  After you shampoo, rinse your hair and body thoroughly to remove the shampoo.                             4.  Use CHG as you would any other liquid soap.  You can apply chg directly to the skin and wash.  Gently with a scrungie or clean washcloth.  5.  Apply the CHG Soap to your body ONLY FROM THE  NECK DOWN.   Do   not use on face/ open                           Wound or open sores. Avoid contact with eyes, ears mouth and   genitals (private parts).                       Wash face,  Genitals (private parts) with your normal soap.             6.  Wash thoroughly, paying special attention to the area where your    surgery  will be performed.  7.  Thoroughly rinse your body with warm water from the neck down.  8.  DO NOT shower/wash with your normal soap after using and rinsing off the CHG Soap.                9.  Pat yourself dry with a clean towel.            10.  Wear clean pajamas.            11.  Place clean sheets on your bed the night of your first shower and do not  sleep with pets. Day of Surgery : Do not apply any lotions/deodorants the morning of surgery.  Please wear clean clothes to the hospital/surgery center.  FAILURE TO FOLLOW THESE INSTRUCTIONS MAY RESULT IN THE CANCELLATION OF YOUR SURGERY  PATIENT SIGNATURE_________________________________  NURSE SIGNATURE__________________________________  ________________________________________________________________________

## 2019-08-16 NOTE — Progress Notes (Addendum)
COVID Vaccine Completed: Yes Date COVID Vaccine completed: 07/01/2019 COVID vaccine manufacturer: Pfizer      PCP - Dr. B. [place] last office visit 08/13/2019 in epic Cardiologist - N/A Neurologist: Dr. Etheleen Mayhew last office visit 05/20/2019  Chest x-ray - 04/21/2019 in epic EKG - 04/12/2019 in epic Stress Test - N/A ECHO - 04/13/2019 in epic Cardiac Cath -   Sleep Study - N/A CPAP - N/A  Fasting Blood Sugar - 110 or lower Checks Blood Sugar __2___ times a day Hgb A1c 6.1 08/13/2019  Blood Thinner Instructions: N/A Aspirin Instructions: Has not received instructions, instructed to call surgeon's office Last Dose:  Anesthesia review: History of Stroke 04/2019 , DM, HTN  Patient denies shortness of breath, fever, cough and chest pain at PAT appointment   Patient verbalized understanding of instructions that were given to them at the PAT appointment. Patient was also instructed that they will need to review over the PAT instructions again at home before surgery.

## 2019-08-17 MED ORDER — HYDROCHLOROTHIAZIDE 25 MG PO TABS: 25.0000 mg | ORAL_TABLET | Freq: Every day | ORAL | 1 refills | Status: DC

## 2019-08-17 MED ORDER — ATORVASTATIN CALCIUM 40 MG PO TABS: 40.0000 mg | ORAL_TABLET | Freq: Every day | ORAL | 3 refills | Status: DC

## 2019-08-19 ENCOUNTER — Encounter (HOSPITAL_COMMUNITY): Payer: Self-pay

## 2019-08-19 ENCOUNTER — Encounter (HOSPITAL_COMMUNITY)
Admission: RE | Admit: 2019-08-19 | Discharge: 2019-08-19 | Disposition: A | Discharge status: Home / Self Care | Payer: BC Managed Care – PPO | Source: Ambulatory Visit | Attending: Surgery | Admitting: Surgery

## 2019-08-19 ENCOUNTER — Other Ambulatory Visit: Payer: Self-pay

## 2019-08-19 DIAGNOSIS — Z01812 Encounter for preprocedural laboratory examination: Secondary | ICD-10-CM | POA: Insufficient documentation

## 2019-08-19 HISTORY — DX: Other specified symptoms and signs involving the circulatory and respiratory systems: R09.89

## 2019-08-19 HISTORY — DX: Alcohol abuse, uncomplicated: F10.10

## 2019-08-19 HISTORY — DX: Obesity, unspecified: E66.9

## 2019-08-19 HISTORY — DX: Umbilical hernia without obstruction or gangrene: K42.9

## 2019-08-19 HISTORY — DX: Unilateral inguinal hernia, without obstruction or gangrene, not specified as recurrent: K40.90

## 2019-08-19 NOTE — Telephone Encounter (Signed)
Form completed, patient notified and stated that he would pick form up today.

## 2019-08-19 NOTE — Progress Notes (Signed)
   08/19/19 1314  OBSTRUCTIVE SLEEP APNEA  Have you ever been diagnosed with sleep apnea through a sleep study? No  Do you snore loudly (loud enough to be heard through closed doors)?  1  Do you often feel tired, fatigued, or sleepy during the daytime (such as falling asleep during driving or talking to someone)? 0  Has anyone observed you stop breathing during your sleep? 1  Do you have, or are you being treated for high blood pressure? 1  BMI more than 35 kg/m2? 1  Age > 50 (1-yes) 1  Neck circumference greater than:Male 16 inches or larger, Male 17inches or larger? 0  Male Gender (Yes=1) 1  Obstructive Sleep Apnea Score 6  Score 5 or greater  Results sent to PCP

## 2019-08-19 NOTE — Progress Notes (Signed)
Sent Stop bang score to Dr. B. [place] faxed via epic

## 2019-08-20 ENCOUNTER — Encounter (HOSPITAL_COMMUNITY)
Admission: RE | Admit: 2019-08-20 | Discharge: 2019-08-20 | Disposition: A | Discharge status: Home / Self Care | Payer: BC Managed Care – PPO | Source: Ambulatory Visit | Attending: Surgery | Admitting: Surgery

## 2019-08-20 DIAGNOSIS — Z01812 Encounter for preprocedural laboratory examination: Secondary | ICD-10-CM | POA: Diagnosis present

## 2019-08-20 LAB — CBC
HCT: 41.5 % (ref 39.0–52.0)
Hemoglobin: 13.9 g/dL (ref 13.0–17.0)
MCH: 28 pg (ref 26.0–34.0)
MCHC: 33.5 g/dL (ref 30.0–36.0)
MCV: 83.7 fL (ref 80.0–100.0)
Platelets: 269 10*3/uL (ref 150–400)
RBC: 4.96 MIL/uL (ref 4.22–5.81)
RDW: 13.1 % (ref 11.5–15.5)
WBC: 7.4 10*3/uL (ref 4.0–10.5)
nRBC: 0 % (ref 0.0–0.2)

## 2019-08-20 LAB — GLUCOSE, CAPILLARY: Glucose-Capillary: 208 mg/dL — ABNORMAL HIGH (ref 70–99)

## 2019-08-20 LAB — BASIC METABOLIC PANEL
Anion gap: 12 (ref 5–15)
BUN: 16 mg/dL (ref 8–23)
CO2: 26 mmol/L (ref 22–32)
Calcium: 9.3 mg/dL (ref 8.9–10.3)
Chloride: 99 mmol/L (ref 98–111)
Creatinine, Ser: 0.95 mg/dL (ref 0.61–1.24)
GFR calc Af Amer: >60 mL/min (ref >60)
GFR calc non Af Amer: >60 mL/min (ref >60)
Glucose, Bld: 208 mg/dL — ABNORMAL HIGH (ref 70–99)
Potassium: 3.6 mmol/L (ref 3.5–5.1)
Sodium: 137 mmol/L (ref 135–145)

## 2019-08-21 ENCOUNTER — Other Ambulatory Visit: Payer: Self-pay

## 2019-08-21 ENCOUNTER — Ambulatory Visit (HOSPITAL_COMMUNITY)
Admission: RE | Admit: 2019-08-21 | Discharge: 2019-08-21 | Disposition: A | Discharge status: Home / Self Care | Payer: BC Managed Care – PPO | Source: Ambulatory Visit | Attending: Family Medicine | Admitting: Family Medicine

## 2019-08-21 DIAGNOSIS — R0989 Other specified symptoms and signs involving the circulatory and respiratory systems: Secondary | ICD-10-CM | POA: Diagnosis present

## 2019-08-26 ENCOUNTER — Other Ambulatory Visit (HOSPITAL_COMMUNITY)
Admission: RE | Admit: 2019-08-26 | Discharge: 2019-08-26 | Disposition: A | Discharge status: Home / Self Care | Payer: BC Managed Care – PPO | Source: Ambulatory Visit | Attending: Surgery | Admitting: Surgery

## 2019-08-26 DIAGNOSIS — Z20822 Contact with and (suspected) exposure to covid-19: Secondary | ICD-10-CM | POA: Insufficient documentation

## 2019-08-26 DIAGNOSIS — Z01812 Encounter for preprocedural laboratory examination: Secondary | ICD-10-CM | POA: Diagnosis present

## 2019-08-26 LAB — SARS CORONAVIRUS 2 (TAT 6-24 HRS): SARS Coronavirus 2: NEGATIVE

## 2019-08-28 MED ORDER — DEXTROSE 5 % IV SOLN
3.0000 g | INTRAVENOUS | Status: AC
  Administered 2019-08-29: 3 g via INTRAVENOUS
  Filled 2019-08-28: qty 3

## 2019-08-28 NOTE — Anesthesia Preprocedure Evaluation (Addendum)
Anesthesia Evaluation  Patient identified by MRN, date of birth, ID band Patient awake    Reviewed: Allergy & Precautions, NPO status , Patient's Chart, lab work & pertinent test results  History of Anesthesia Complications Negative for: history of anesthetic complications  Airway Mallampati: III  TM Distance: >3 FB Neck ROM: Full    Dental  (+) Missing,    Pulmonary neg pulmonary ROS,    Pulmonary exam normal        Cardiovascular hypertension, Pt. on medications + Peripheral Vascular Disease  Normal cardiovascular exam     Neuro/Psych CVA (right arm weakness), Residual Symptoms negative psych ROS   GI/Hepatic negative GI ROS, Neg liver ROS,   Endo/Other  diabetes, Type 2, Oral Hypoglycemic AgentsMorbid obesity  Renal/GU negative Renal ROS  negative genitourinary   Musculoskeletal negative musculoskeletal ROS (+)   Abdominal   Peds  Hematology negative hematology ROS (+)   Anesthesia Other Findings Day of surgery medications reviewed with patient.  Reproductive/Obstetrics negative OB ROS                            Anesthesia Physical Anesthesia Plan  ASA: III  Anesthesia Plan: General   Post-op Pain Management:    Induction: Intravenous  PONV Risk Score and Plan: 3 and Treatment may vary due to age or medical condition, Ondansetron, Dexamethasone and Midazolam  Airway Management Planned: Oral ETT  Additional Equipment: None  Intra-op Plan:   Post-operative Plan: Extubation in OR  Informed Consent: I have reviewed the patients History and Physical, chart, labs and discussed the procedure including the risks, benefits and alternatives for the proposed anesthesia with the patient or authorized representative who has indicated his/her understanding and acceptance.     Dental advisory given  Plan Discussed with: CRNA  Anesthesia Plan Comments:        Anesthesia Quick  Evaluation

## 2019-08-28 NOTE — H&P (Signed)
°  Benjamin Rosales  Location: [facility] Patient #: [id number] DOB: 1955-08-18 Single / Language: [place] / Race: White Male   History of Present Illness   The patient is a 64 year old male who presents for an evaluation of a hernia. He is here for a follow-up regarding his large hernia at umbilicus. 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 He has been having excoriated skin at the umbilicus from this large hernia. He denies any issues with voiding. He has otherwise been doing well.  After I saw him, we did a CAT scan of the abdomen and pelvis. This shows a large hernia containing colon. It did, however, also show a right inguinal hernia which contains bladder going down into the scrotum almost. He does report some slowness on urination.   Allergies No Known Drug Allergies   Medication History  amLODIPine Besylate (10MG  Tablet, Oral) Active. Atorvastatin Calcium (40MG  Tablet, Oral) Active. Folic Acid (1MG  Tablet, Oral) Active. hydrALAZINE HCl (25MG  Tablet, Oral) Active. hydroCHLOROthiazide (12.5MG  Tablet, Oral) Active. metFORMIN HCl (500MG  Tablet, Oral) Active. Aspirin (81MG  Tablet, Oral) Active. Vitamin B12 (Oral) Specific strength unknown - Active. Multi-Vitamin (Oral) Active. Medications Reconciled  Vitals  Weight: 281.4 lb Height: 67in Body Surface Area: 2.34 m Body Mass Index: 44.07 kg/m  Pulse: 90 (Regular)  BP: 150/80(Sitting, Left Arm, Standard)    Physical Exam  The physical exam findings are as follows: Note: Again, on exam, he is morbidly obese. 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 There is still irritated skin there.  He does have a right inguinal hernia. I cannot reduce the contents which is nontender.    Assessment & Plan   UMBILICAL HERNIA, INCARCERATED (K42.0) RIGHT INGUINAL HERNIA  Impression: I again reviewed his CT scan. I  discussed the results the patient and his wife. I am recommending repair of the umbilical hernia with mesh. He will also need a right inguinal hernia repair with mesh. As this does contain a significant portion of bladder, I will refer him to [facility] preoperatively as I believe he may need a urologist present at the time of the inguinal hernia repair in case surgery on the bladder is required. The patient agrees with plan. I will call him back after he has been evaluated by urology.  Addendum:  He has been seen by urology who just want to be on standby for any injury to the bladder that may occur during hernia repair. We again discussed the risks of both repairs including but not limited to bleeding, infection, injury to the bladder, injury to surrounding structures, the use of mesh, nerve injury, chronic pain, cardiopulmonary issues, stroke, etc.  He agrees to proceed.

## 2019-08-29 ENCOUNTER — Ambulatory Visit (HOSPITAL_COMMUNITY): Payer: BC Managed Care – PPO | Admitting: Certified Registered Nurse Anesthetist

## 2019-08-29 ENCOUNTER — Encounter (HOSPITAL_COMMUNITY)
Admission: RE | Disposition: A | Discharge status: Home / Self Care | Payer: Self-pay | Source: Ambulatory Visit | Attending: Surgery

## 2019-08-29 ENCOUNTER — Ambulatory Visit (HOSPITAL_COMMUNITY)
Admission: RE | Admit: 2019-08-29 | Discharge: 2019-08-29 | Disposition: A | Discharge status: Home / Self Care | Payer: BC Managed Care – PPO | Source: Ambulatory Visit | Attending: Surgery | Admitting: Surgery

## 2019-08-29 DIAGNOSIS — Z6841 Body Mass Index (BMI) 40.0 and over, adult: Secondary | ICD-10-CM | POA: Diagnosis not present

## 2019-08-29 DIAGNOSIS — I69351 Hemiplegia and hemiparesis following cerebral infarction affecting right dominant side: Secondary | ICD-10-CM | POA: Diagnosis not present

## 2019-08-29 DIAGNOSIS — E1151 Type 2 diabetes mellitus with diabetic peripheral angiopathy without gangrene: Secondary | ICD-10-CM | POA: Diagnosis not present

## 2019-08-29 DIAGNOSIS — Z7984 Long term (current) use of oral hypoglycemic drugs: Secondary | ICD-10-CM | POA: Diagnosis not present

## 2019-08-29 DIAGNOSIS — Z79899 Other long term (current) drug therapy: Secondary | ICD-10-CM | POA: Insufficient documentation

## 2019-08-29 DIAGNOSIS — K4090 Unilateral inguinal hernia, without obstruction or gangrene, not specified as recurrent: Secondary | ICD-10-CM | POA: Insufficient documentation

## 2019-08-29 DIAGNOSIS — K420 Umbilical hernia with obstruction, without gangrene: Secondary | ICD-10-CM | POA: Diagnosis not present

## 2019-08-29 DIAGNOSIS — I10 Essential (primary) hypertension: Secondary | ICD-10-CM | POA: Diagnosis not present

## 2019-08-29 DIAGNOSIS — Z7982 Long term (current) use of aspirin: Secondary | ICD-10-CM | POA: Diagnosis not present

## 2019-08-29 HISTORY — PX: INGUINAL HERNIA REPAIR: SHX194

## 2019-08-29 HISTORY — PX: UMBILICAL HERNIA REPAIR: SHX196

## 2019-08-29 LAB — GLUCOSE, CAPILLARY
Glucose-Capillary: 131 mg/dL — ABNORMAL HIGH (ref 70–99)
Glucose-Capillary: 164 mg/dL — ABNORMAL HIGH (ref 70–99)

## 2019-08-29 SURGERY — REPAIR, HERNIA, UMBILICAL, ADULT
Anesthesia: General

## 2019-08-29 MED ORDER — GABAPENTIN 300 MG PO CAPS
300.0000 mg | ORAL_CAPSULE | ORAL | Status: AC
  Administered 2019-08-29: 300 mg via ORAL
  Filled 2019-08-29: qty 1

## 2019-08-29 MED ORDER — DEXAMETHASONE SODIUM PHOSPHATE 10 MG/ML IJ SOLN
INTRAMUSCULAR | Status: AC
  Filled 2019-08-29: qty 1

## 2019-08-29 MED ORDER — ENSURE PRE-SURGERY PO LIQD
296.0000 mL | Freq: Once | ORAL | Status: DC
  Filled 2019-08-29: qty 296

## 2019-08-29 MED ORDER — FENTANYL CITRATE (PF) 250 MCG/5ML IJ SOLN
INTRAMUSCULAR | Status: AC
  Filled 2019-08-29: qty 5

## 2019-08-29 MED ORDER — PROMETHAZINE HCL 25 MG/ML IJ SOLN: 6.2500 mg | INTRAMUSCULAR | Status: DC | PRN

## 2019-08-29 MED ORDER — LIDOCAINE 2% (20 MG/ML) 5 ML SYRINGE
INTRAMUSCULAR | Status: DC | PRN
  Administered 2019-08-29: 60 mg via INTRAVENOUS

## 2019-08-29 MED ORDER — ONDANSETRON HCL 4 MG/2ML IJ SOLN
INTRAMUSCULAR | Status: DC | PRN
  Administered 2019-08-29: 4 mg via INTRAVENOUS

## 2019-08-29 MED ORDER — CHLORHEXIDINE GLUCONATE 0.12 % MT SOLN
15.0000 mL | Freq: Once | OROMUCOSAL | Status: AC
  Administered 2019-08-29: 15 mL via OROMUCOSAL

## 2019-08-29 MED ORDER — PROPOFOL 10 MG/ML IV BOLUS
INTRAVENOUS | Status: DC | PRN
  Administered 2019-08-29: 200 mg via INTRAVENOUS

## 2019-08-29 MED ORDER — CHLORHEXIDINE GLUCONATE CLOTH 2 % EX PADS: 6.0000 | MEDICATED_PAD | Freq: Once | CUTANEOUS | Status: DC

## 2019-08-29 MED ORDER — ACETAMINOPHEN 500 MG PO TABS
1000.0000 mg | ORAL_TABLET | Freq: Once | ORAL | Status: DC
  Filled 2019-08-29: qty 2

## 2019-08-29 MED ORDER — EPHEDRINE SULFATE 50 MG/ML IJ SOLN
INTRAMUSCULAR | Status: DC | PRN
  Administered 2019-08-29 (×2): 5 mg via INTRAVENOUS

## 2019-08-29 MED ORDER — CEFAZOLIN SODIUM-DEXTROSE 2-4 GM/100ML-% IV SOLN
INTRAVENOUS | Status: AC
  Filled 2019-08-29: qty 100

## 2019-08-29 MED ORDER — ONDANSETRON HCL 4 MG/2ML IJ SOLN
INTRAMUSCULAR | Status: AC
  Filled 2019-08-29: qty 2

## 2019-08-29 MED ORDER — ROCURONIUM BROMIDE 10 MG/ML (PF) SYRINGE
PREFILLED_SYRINGE | INTRAVENOUS | Status: DC | PRN
  Administered 2019-08-29: 20 mg via INTRAVENOUS
  Administered 2019-08-29: 80 mg via INTRAVENOUS

## 2019-08-29 MED ORDER — SUGAMMADEX SODIUM 500 MG/5ML IV SOLN
INTRAVENOUS | Status: DC | PRN
Start: 2019-08-29 — End: 2019-08-29
  Administered 2019-08-29: 250 mg via INTRAVENOUS

## 2019-08-29 MED ORDER — MIDAZOLAM HCL 5 MG/5ML IJ SOLN
INTRAMUSCULAR | Status: DC | PRN
  Administered 2019-08-29: 2 mg via INTRAVENOUS

## 2019-08-29 MED ORDER — CELECOXIB 200 MG PO CAPS
400.0000 mg | ORAL_CAPSULE | ORAL | Status: AC
  Administered 2019-08-29: 400 mg via ORAL
  Filled 2019-08-29: qty 2

## 2019-08-29 MED ORDER — FENTANYL CITRATE (PF) 100 MCG/2ML IJ SOLN: INTRAMUSCULAR | Status: DC | PRN

## 2019-08-29 MED ORDER — FENTANYL CITRATE (PF) 100 MCG/2ML IJ SOLN
25.0000 ug | INTRAMUSCULAR | Status: DC | PRN
  Administered 2019-08-29 (×2): 50 ug via INTRAVENOUS

## 2019-08-29 MED ORDER — BUPIVACAINE HCL (PF) 0.5 % IJ SOLN
INTRAMUSCULAR | Status: AC
  Filled 2019-08-29: qty 30

## 2019-08-29 MED ORDER — LACTATED RINGERS IV SOLN: INTRAVENOUS | Status: DC

## 2019-08-29 MED ORDER — FENTANYL CITRATE (PF) 100 MCG/2ML IJ SOLN
INTRAMUSCULAR | Status: AC
  Filled 2019-08-29: qty 2

## 2019-08-29 MED ORDER — ROCURONIUM BROMIDE 10 MG/ML (PF) SYRINGE
PREFILLED_SYRINGE | INTRAVENOUS | Status: AC
  Filled 2019-08-29: qty 10

## 2019-08-29 MED ORDER — SUGAMMADEX SODIUM 500 MG/5ML IV SOLN
INTRAVENOUS | Status: AC
  Filled 2019-08-29: qty 10

## 2019-08-29 MED ORDER — DEXAMETHASONE SODIUM PHOSPHATE 10 MG/ML IJ SOLN
INTRAMUSCULAR | Status: DC | PRN
  Administered 2019-08-29: 6 mg via INTRAVENOUS

## 2019-08-29 MED ORDER — OXYCODONE HCL 5 MG PO TABS
5.0000 mg | ORAL_TABLET | Freq: Once | ORAL | Status: AC | PRN
  Administered 2019-08-29: 5 mg via ORAL

## 2019-08-29 MED ORDER — ACETAMINOPHEN 500 MG PO TABS
1000.0000 mg | ORAL_TABLET | ORAL | Status: AC
  Administered 2019-08-29: 1000 mg via ORAL

## 2019-08-29 MED ORDER — LIDOCAINE 2% (20 MG/ML) 5 ML SYRINGE
INTRAMUSCULAR | Status: AC
  Filled 2019-08-29: qty 5

## 2019-08-29 MED ORDER — FENTANYL CITRATE (PF) 250 MCG/5ML IJ SOLN
INTRAMUSCULAR | Status: DC | PRN
  Administered 2019-08-29: 25 ug via INTRAVENOUS
  Administered 2019-08-29 (×3): 50 ug via INTRAVENOUS
  Administered 2019-08-29: 25 ug via INTRAVENOUS

## 2019-08-29 MED ORDER — MIDAZOLAM HCL 2 MG/2ML IJ SOLN
INTRAMUSCULAR | Status: AC
  Filled 2019-08-29: qty 2

## 2019-08-29 MED ORDER — OXYCODONE HCL 5 MG PO TABS: 5.0000 mg | ORAL_TABLET | Freq: Four times a day (QID) | ORAL | 0 refills | Status: DC | PRN

## 2019-08-29 MED ORDER — OXYCODONE HCL 5 MG PO TABS
ORAL_TABLET | ORAL | Status: AC
  Filled 2019-08-29: qty 1

## 2019-08-29 MED ORDER — OXYCODONE HCL 5 MG/5ML PO SOLN: 5.0000 mg | Freq: Once | ORAL | Status: AC | PRN

## 2019-08-29 MED ORDER — PROPOFOL 10 MG/ML IV BOLUS
INTRAVENOUS | Status: AC
  Filled 2019-08-29: qty 40

## 2019-08-29 MED ORDER — BUPIVACAINE HCL (PF) 0.5 % IJ SOLN
INTRAMUSCULAR | Status: DC | PRN
  Administered 2019-08-29: 30 mL

## 2019-08-29 MED ORDER — 0.9 % SODIUM CHLORIDE (POUR BTL) OPTIME
TOPICAL | Status: DC | PRN
  Administered 2019-08-29: 1000 mL

## 2019-08-29 MED ORDER — ORAL CARE MOUTH RINSE: 15.0000 mL | Freq: Once | OROMUCOSAL | Status: AC

## 2019-08-29 SURGICAL SUPPLY — 43 items
BINDER ABDOMINAL 12 ML 46-62 (SOFTGOODS) ×3 IMPLANT
BLADE SURG 15 STRL LF DISP TIS (BLADE) ×1 IMPLANT
BLADE SURG 15 STRL SS (BLADE) ×2
CHLORAPREP W/TINT 26 (MISCELLANEOUS) ×3 IMPLANT
COVER WAND RF STERILE (DRAPES) IMPLANT
DECANTER SPIKE VIAL GLASS SM (MISCELLANEOUS) IMPLANT
DERMABOND ADVANCED (GAUZE/BANDAGES/DRESSINGS) ×6
DERMABOND ADVANCED .7 DNX12 (GAUZE/BANDAGES/DRESSINGS) ×3 IMPLANT
DRAIN PENROSE 0.5X18 (DRAIN) ×3 IMPLANT
DRAPE LAPAROSCOPIC ABDOMINAL (DRAPES) ×3 IMPLANT
DRAPE LAPAROTOMY TRNSV 102X78 (DRAPES) ×3 IMPLANT
DRAPE UTILITY XL STRL (DRAPES) ×3 IMPLANT
ELECT REM PT RETURN 15FT ADLT (MISCELLANEOUS) ×3 IMPLANT
GAUZE SPONGE 4X4 12PLY STRL (GAUZE/BANDAGES/DRESSINGS) IMPLANT
GOWN STRL REUS W/TWL XL LVL3 (GOWN DISPOSABLE) ×6 IMPLANT
KIT BASIN (CUSTOM PROCEDURE TRAY) ×3 IMPLANT
KIT TURNOVER KIT A (KITS) IMPLANT
MESH PARIETEX PROGRIP RIGHT (Mesh General) ×3 IMPLANT
MESH VENTRALEX ST 8CM LRG (Mesh General) ×3 IMPLANT
NEEDLE HYPO 22GX1.5 SAFETY (NEEDLE) ×3 IMPLANT
NEEDLE HYPO 25X1 1.5 SAFETY (NEEDLE) ×3 IMPLANT
PACK BASIC VI WITH GOWN DISP (CUSTOM PROCEDURE TRAY) ×3 IMPLANT
PENCIL SMOKE EVACUATOR (MISCELLANEOUS) ×3 IMPLANT
SPONGE LAP 18X18 RF (DISPOSABLE) ×3 IMPLANT
SPONGE LAP 4X18 RFD (DISPOSABLE) ×3 IMPLANT
SUT MNCRL AB 4-0 PS2 18 (SUTURE) ×6 IMPLANT
SUT NOVA NAB DX-16 0-1 5-0 T12 (SUTURE) ×6 IMPLANT
SUT SILK 2 0 SH (SUTURE) IMPLANT
SUT VIC AB 2-0 CT1 27 (SUTURE) ×4
SUT VIC AB 2-0 CT1 TAPERPNT 27 (SUTURE) ×2 IMPLANT
SUT VIC AB 3-0 54XBRD REEL (SUTURE) IMPLANT
SUT VIC AB 3-0 BRD 54 (SUTURE)
SUT VIC AB 3-0 SH 27 (SUTURE) ×4
SUT VIC AB 3-0 SH 27X BRD (SUTURE) ×1 IMPLANT
SUT VIC AB 3-0 SH 27XBRD (SUTURE) ×1 IMPLANT
SUT VICRYL 2 0 18  UND BR (SUTURE) ×2
SUT VICRYL 2 0 18 UND BR (SUTURE) ×1 IMPLANT
SYR 20ML LL LF (SYRINGE) ×3 IMPLANT
SYR BULB EAR ULCER 3OZ GRN STR (SYRINGE) ×3 IMPLANT
SYR CONTROL 10ML LL (SYRINGE) ×3 IMPLANT
TOWEL OR 17X26 10 PK STRL BLUE (TOWEL DISPOSABLE) ×3 IMPLANT
TOWEL OR NON WOVEN STRL DISP B (DISPOSABLE) ×3 IMPLANT
YANKAUER SUCT BULB TIP 10FT TU (MISCELLANEOUS) ×3 IMPLANT

## 2019-08-29 NOTE — Interval H&P Note (Signed)
History and Physical Interval Note: no change in H and P  08/29/2019 7:02 AM  Benjamin Rosales.  has presented today for surgery, with the diagnosis of UMBILICAL AND RIGHT INGUINAL HERNIA.  The various methods of treatment have been discussed with the patient and family. After consideration of risks, benefits and other options for treatment, the patient has consented to  Procedure(s): UMBILICAL HERNIA REPAIR WITH MESH (N/A) RIGHT INGUINAL HERNIA REPAIR WITH MESH (N/A) as a surgical intervention.  The patient's history has been reviewed, patient examined, no change in status, stable for surgery.  I have reviewed the patient's chart and labs.  Questions were answered to the patient's satisfaction.     Abigail Miyamoto

## 2019-08-29 NOTE — Op Note (Signed)
UMBILICAL HERNIA REPAIR WITH MESH, RIGHT INGUINAL HERNIA REPAIR WITH MESH  Procedure Note  Emmert Roethler. 08/29/2019   Pre-op Diagnosis: UMBILICAL AND RIGHT INGUINAL HERNIA     Post-op Diagnosis: same  Procedure(s): UMBILICAL HERNIA REPAIR WITH MESH  RIGHT INGUINAL HERNIA REPAIR WITH MESH  Surgeon(s): Abigail Miyamoto, MD  Anesthesia: General  Staff:  Circulator: Forbes Cellar, RN Scrub Person: Maudie Mercury, April C, CST  Estimated Blood Loss: Minimal               Findings: The patient is an umbilical hernia with incarcerated omentum.  This was repaired with an 8.3 cm round ventral Prolene patch from [employer].  The patient had a direct right inguinal hernia which was repaired with a large piece of progrip Prolene mesh from [employer]  Procedure: The patient was brought to operating identifies correct patient.  He is placed upon the operating table general anesthesia was induced.  His abdomen was then prepped and draped in usual sterile fashion.  I addressed the umbilical hernia first.  He had a chronically incarcerated umbilical hernia with some slightly excoriated skin.  I performed a transverse incision at the upper edge of the umbilicus and excised a moderate amount of redundant skin with the cautery.  I then separated the very large hernia sac from the surrounding subcutaneous tissue.  I opened the sac and was found to contain only omentum.  The actual fascial defect was only 3-1/2 to 4 cm in size.  I was able to excise the sac with the cautery and then free up the attachments of the omentum around the fascial edges.  I was then able to completely reduce the omentum back to the abdominal cavity.  An 8.3 cm round ventral Prolene patch from [employer] was brought to the field.  I up against the pubic tubercle through the fascia opening and pulled up into the peritoneum.  The mesh was then sutured in place with interrupted #1 Novafil sutures.  I was then able to cut the  stay ties and closed the fascia over the top of the mesh with figure-of-eight #1 Novafil sutures.  I anesthetized the fascia further with Marcaine.  I tacked the umbilical skin back in place with a 3-0 Vicryl suture.  I then closed subcutaneous tissue with interrupted 3-0 Vicryl sutures and closed the skin with a running 4-0 Monocryl.  I next performed ilioinguinal nerve block with Marcaine.  I then made a longitudinal incision in the right lower inguinal area with a scalpel.  I took this down through Scarpa's fascia electrocautery.  The external beak fascia was opened toward the internal and external rings.  I was able to use a controlled the testicular cord and structures with a Penrose drain.  There was no evidence of indirect hernia.  The patient had a very large direct hernia defect.  This contained some omentum but mostly bladder.  I was able to free the hernia sac up out of the scrotum with blunt dissection.  I was then able to excise the sac and then finally completely reduce the bladder and the omentum back into the abdominal cavity.  Next a piece of large Prolene ProGrip mesh from [employer] was brought to the field.  I placed the mesh against the pubic tubercle and brought around the cord structures recreating the inguinal floor.  I then sutured the mesh and circumferentially with 2-0 Vicryl sutures.  Wide coverage of the inguinal floor and closure of the fascial  defect appeared to be achieved.  I then closed the external beak fascia over the top of the of the mesh after removing the Penrose drain.  I then anesthetized the fascia with Marcaine.  I then closed the subcutaneous tissue with interrupted 3-0 Vicryl sutures and closed the skin with a running 4-0 Monocryl.  Dermabond was then applied to both incisions.  The patient tolerated procedure well.  All the counts were correct at the end the procedure.  The patient was then extubated in the operating room and taken in stable condition to the recovery  room.          Abigail Miyamoto   Date: 08/29/2019  Time: 9:00 AM

## 2019-08-29 NOTE — Transfer of Care (Signed)
Immediate Anesthesia Transfer of Care Note  Patient: Benjamin Rosales.  Procedure(s) Performed: UMBILICAL HERNIA REPAIR WITH MESH (N/A ) RIGHT INGUINAL HERNIA REPAIR WITH MESH (N/A )  Patient Location: PACU  Anesthesia Type:General  Level of Consciousness: awake, alert , oriented and patient cooperative  Airway & Oxygen Therapy: Patient Spontanous Breathing and Patient connected to face mask oxygen  Post-op Assessment: Report given to RN and Post -op Vital signs reviewed and stable  Post vital signs: Reviewed and stable  Last Vitals:  Vitals Value Taken Time  BP 176/95 08/29/19 0907  Temp    Pulse 69 08/29/19 0910  Resp 14 08/29/19 0910  SpO2 98 % 08/29/19 0910  Vitals shown include unvalidated device data.  Last Pain:  Vitals:   08/29/19 0628  TempSrc: Oral         Complications: No complications documented.

## 2019-08-29 NOTE — Anesthesia Procedure Notes (Signed)
Procedure Name: Intubation Date/Time: 08/29/2019 7:28 AM Performed by: West Pugh, CRNA Pre-anesthesia Checklist: Patient identified, Emergency Drugs available, Suction available, Patient being monitored and Timeout performed Patient Re-evaluated:Patient Re-evaluated prior to induction Oxygen Delivery Method: Circle system utilized Preoxygenation: Pre-oxygenation with 100% oxygen Induction Type: IV induction Ventilation: Mask ventilation without difficulty and Oral airway inserted - appropriate to patient size Laryngoscope Size: Mac and 4 Grade View: Grade II Tube type: Oral Tube size: 7.5 mm Airway Equipment and Method: Stylet Placement Confirmation: ETT inserted through vocal cords under direct vision,  positive ETCO2,  CO2 detector and breath sounds checked- equal and bilateral Secured at: 22 cm Tube secured with: Tape Dental Injury: Teeth and Oropharynx as per pre-operative assessment

## 2019-08-29 NOTE — Anesthesia Postprocedure Evaluation (Signed)
Anesthesia Post Note  Patient: Benjamin Rosales.  Procedure(s) Performed: UMBILICAL HERNIA REPAIR WITH MESH (N/A ) RIGHT INGUINAL HERNIA REPAIR WITH MESH (N/A )     Patient location during evaluation: PACU Anesthesia Type: General Level of consciousness: awake and alert and oriented Pain management: pain level controlled Vital Signs Assessment: post-procedure vital signs reviewed and stable Respiratory status: spontaneous breathing, nonlabored ventilation and respiratory function stable Cardiovascular status: blood pressure returned to baseline Postop Assessment: no apparent nausea or vomiting Anesthetic complications: no   No complications documented.             Kaylyn Layer

## 2019-08-29 NOTE — Discharge Instructions (Signed)
[facility] _______[facility]  UMBILICAL OR INGUINAL HERNIA REPAIR: POST OP INSTRUCTIONS  Always review your discharge instruction sheet given to you by the facility where your surgery was performed. IF YOU HAVE DISABILITY OR FAMILY LEAVE FORMS, YOU MUST BRING THEM TO THE OFFICE FOR PROCESSING.   DO NOT GIVE THEM TO YOUR DOCTOR.  1. A  prescription for pain medication may be given to you upon discharge.  Take your pain medication as prescribed, if needed.  If narcotic pain medicine is not needed, then you may take acetaminophen (Tylenol) or ibuprofen (Advil) as needed. 2. Take your usually prescribed medications unless otherwise directed. If you need a refill on your pain medication, please contact your pharmacy.  They will contact our office to request authorization. Prescriptions will not be filled after 5 pm or on week-ends. 3. You should follow a light diet the first 24 hours after arrival home, such as soup and crackers, etc.  Be sure to include lots of fluids daily.  Resume your normal diet the day after surgery. 4.Most patients will experience some swelling and bruising around the umbilicus or in the groin and scrotum.  Ice packs and reclining will help.  Swelling and bruising can take several days to resolve.  6. It is common to experience some constipation if taking pain medication after surgery.  Increasing fluid intake and taking a stool softener (such as Colace) will usually help or prevent this problem from occurring.  A mild laxative (Milk of Magnesia or Miralax) should be taken according to package directions if there are no bowel movements after 48 hours. 7. Unless discharge instructions indicate otherwise, you may remove your bandages 24-48 hours after surgery, and you may shower at that time.  You may have steri-strips (small skin tapes) in place directly over the incision.  These strips should be left on the skin for 7-10 days.  If your surgeon used skin glue on the  incision, you may shower in 24 hours.  The glue will flake off over the next 2-3 weeks.  Any sutures or staples will be removed at the office during your follow-up visit. 8. ACTIVITIES:  You may resume regular (light) daily activities beginning the next day--such as daily self-care, walking, climbing stairs--gradually increasing activities as tolerated.  You may have sexual intercourse when it is comfortable.  Refrain from any heavy lifting or straining until approved by your doctor.  a.You may drive when you are no longer taking prescription pain medication, you can comfortably wear a seatbelt, and you can safely maneuver your car and apply brakes. b.RETURN TO WORK:   _____________________________________________  9.You should see your doctor in the office for a follow-up appointment approximately 2-3 weeks after your surgery.  Make sure that you call for this appointment within a day or two after you arrive home to insure a convenient appointment time. 10.OTHER INSTRUCTIONS: _OK TO SHOWER STARTING TOMORROW NO LIFTING MORE THAN 15 POUNDS FOR 6 WEEKS ICE PACK, TYLENOL, IBUPROFEN ALSO FOR PAIN ________________________    _____________________________________  WHEN TO CALL YOUR DOCTOR: 1. Fever over 101.0 2. Inability to urinate 3. Nausea and/or vomiting 4. Extreme swelling or bruising 5. Continued bleeding from incision. 6. Increased pain, redness, or drainage from the incision  The clinic staff is available to answer your questions during regular business hours.  Please dont hesitate to call and ask to speak to one of the nurses for clinical concerns.  If you have a medical emergency, go to the nearest  emergency room or call 911.  A surgeon from [facility] is always on call at the hospital   [address] [place], [place], [place]  [address] ?  [address], [place], [place]   [address] [phone] ? [phone] ? FAX [phone] Web site:  [email or web address]

## 2019-08-30 ENCOUNTER — Encounter (HOSPITAL_COMMUNITY): Payer: Self-pay | Admitting: Surgery

## 2019-08-31 ENCOUNTER — Other Ambulatory Visit: Payer: Self-pay | Admitting: Family Medicine

## 2019-10-15 ENCOUNTER — Telehealth: Payer: Self-pay | Admitting: Family Medicine

## 2019-10-15 DIAGNOSIS — I10 Essential (primary) hypertension: Secondary | ICD-10-CM

## 2019-10-15 MED ORDER — HYDROCHLOROTHIAZIDE 25 MG PO TABS: 25.0000 mg | ORAL_TABLET | Freq: Every day | ORAL | 1 refills | Status: DC

## 2019-10-15 MED ORDER — VITAMIN B12 1000 MCG PO TBCR: 1000.0000 ug | EXTENDED_RELEASE_TABLET | Freq: Every day | ORAL | 1 refills | Status: AC

## 2019-10-15 MED ORDER — AMLODIPINE BESYLATE 10 MG PO TABS: 10.0000 mg | ORAL_TABLET | Freq: Every day | ORAL | 1 refills | Status: DC

## 2019-10-15 MED ORDER — THIAMINE HCL 100 MG PO TABS: 100.0000 mg | ORAL_TABLET | Freq: Every day | ORAL | 1 refills | Status: DC

## 2019-10-15 MED ORDER — METFORMIN HCL 500 MG PO TABS: 500.0000 mg | ORAL_TABLET | Freq: Two times a day (BID) | ORAL | 1 refills | Status: DC

## 2019-10-15 MED ORDER — HYDRALAZINE HCL 25 MG PO TABS: 25.0000 mg | ORAL_TABLET | Freq: Three times a day (TID) | ORAL | 1 refills | Status: DC

## 2019-10-15 NOTE — Telephone Encounter (Signed)
Pt's spouse Glennon Mac) stated the pt is out of his bp medication and they had to switch his pharmacy to [facility] so all these medication will need to be sent to the new pharmacy  Medication: Amlodipine  Metformin Hydrochlorothiazide Hydralazine  Thiamine Cyanocobalamin    Pharmacy:  [id number] [place], [place] - [address] AT [facility] OF [address] Phone:  [phone]  Fax:  [phone]

## 2019-10-15 NOTE — Telephone Encounter (Signed)
Rx's sent in as requested. 

## 2019-10-28 ENCOUNTER — Encounter: Payer: Self-pay | Admitting: Family Medicine

## 2019-10-28 ENCOUNTER — Ambulatory Visit (INDEPENDENT_AMBULATORY_CARE_PROVIDER_SITE_OTHER): Payer: BC Managed Care – PPO | Admitting: Family Medicine

## 2019-10-28 ENCOUNTER — Ambulatory Visit: Payer: Self-pay

## 2019-10-28 ENCOUNTER — Other Ambulatory Visit: Payer: Self-pay

## 2019-10-28 DIAGNOSIS — M25561 Pain in right knee: Secondary | ICD-10-CM

## 2019-10-28 NOTE — Progress Notes (Signed)
Office Visit Note   Patient: Benjamin Rosales.           Date of Birth: 01-May-1955           MRN: [id number] Visit Date: 10/28/2019 Requested by: [place], Betty G, MD [address] [place],  [place] [address] PCP: [place], Betty G, MD  Subjective: Chief Complaint  Patient presents with   Right Knee - Pain    Chronic pain x years. Cortisone injection at last ov did not help (April). Could not take the glucosamine, due to his blood pressure.    HPI: He is here for follow-up right knee pain.  Cortisone injection unfortunately did not help.  He continues to have pain on a daily basis, mostly on the medial side of his knee.  He cannot use that by going upstairs.  He has to keep his leg relatively straight when walking on flat surfaces.  He has not had much in the way of mechanical symptoms.  Since last visit he underwent hernia repair without any difficulties during anesthesia.  He continues to recover well from his stroke.  He has lost 40 pounds.               ROS:   All other systems were reviewed and are negative.  Objective: Vital Signs: There were no vitals taken for this visit.  Physical Exam:  General:  Alert and oriented, in no acute distress. Pulm:  Breathing unlabored. Psy:  Normal mood, congruent affect.  Right knee: 1-2+ effusion with no warmth or erythema.  Tender on the medial joint line.  He has 1+ laxity with valgus stress but still a solid endpoint.  He has varicose veins in his legs with 1+ pitting edema.  Imaging: XR Knee 1-2 Views Right  Result Date: 10/28/2019 X-rays of the right knee reveal bone-on-bone medial compartment DJD with periarticular spurring.  There is moderate to severe lateral compartment DJD and patellofemoral DJD.  No obvious loose body, no sign of neoplasm or stress fracture.  Left knee has moderate tricompartmental changes as well.   Assessment & Plan: 1.  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  We also discussed gel injections.  He would like to pursue that for now.  We will request approval and bring him back for an injection. -At some point he may need to consider total knee replacement.     Procedures: No procedures performed  No notes on file     PMFS History: Patient Active Problem List   Diagnosis Date Noted   Hyperlipidemia associated with type 2 diabetes mellitus (HCC) 08/13/2019   Hypertension, essential, benign 05/22/2019   Morbid obesity (HCC) 05/22/2019   DM (diabetes mellitus), type 2 with peripheral vascular complications (HCC) 04/24/2019   Lacunar stroke (HCC) 04/12/2019   Past Medical History:  Diagnosis Date   Alcohol abuse    Decreased pedal pulses    Right foot   Diabetes mellitus without complication (HCC)    Hypercholesteremia    Hypertension    Obesity    Right inguinal hernia    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   Umbilical hernia     Family History  Problem Relation Age of Onset   High blood pressure Father    Heart disease Father    High blood pressure  Brother    Diabetes Brother     Past Surgical History:  Procedure Laterality Date   INGUINAL HERNIA REPAIR N/A 08/29/2019   Procedure: RIGHT INGUINAL HERNIA REPAIR WITH MESH;  Surgeon: Abigail Miyamoto, MD;  Location: [facility];  Service: General;  Laterality: N/A;   TONSILLECTOMY     UMBILICAL HERNIA REPAIR N/A 08/29/2019   Procedure: UMBILICAL HERNIA REPAIR WITH MESH;  Surgeon: Abigail Miyamoto, MD;  Location: [facility];  Service: General;  Laterality: N/A;   Social History   Occupational History    Comment: NA  Tobacco Use   Smoking status: Never Smoker   Smokeless tobacco: Never Used  Vaping Use   Vaping Use: Never used  Substance and Sexual Activity   Alcohol use: Not Currently     Alcohol/week: 28.0 standard drinks    Types: 28 Shots of liquor per week    Comment: 1 pint of liquor per day, 05/20/19 none   Drug use: Never   Sexual activity: Not on file

## 2019-10-28 NOTE — Progress Notes (Signed)
Noted  

## 2019-10-31 ENCOUNTER — Telehealth: Payer: Self-pay

## 2019-10-31 NOTE — Telephone Encounter (Signed)
Submitted VOB, SynviscOne, right knee. 

## 2019-11-10 ENCOUNTER — Encounter: Payer: Self-pay | Admitting: Family Medicine

## 2019-11-11 ENCOUNTER — Telehealth: Payer: Self-pay

## 2019-11-11 NOTE — Telephone Encounter (Signed)
Please tell him that since insurance won't cover gel injections, we could either try dextrose injections, or physical therapy, or else make consultation with surgeon for knee replacement.

## 2019-11-11 NOTE — Telephone Encounter (Signed)
Please advise 

## 2019-11-11 NOTE — Telephone Encounter (Signed)
Patient's insurance does not cover gel injections.  Please advise on the next option for patient.  Cb#  [phone].  Thank you.

## 2019-11-11 NOTE — Telephone Encounter (Signed)
Left Dr. Prince Rome' options on the patient's voice mail. Advised him to call/message [place] with how he would like to proceed, or if he has additional questions/concerns.

## 2019-12-14 ENCOUNTER — Encounter: Payer: Self-pay | Admitting: Family Medicine

## 2019-12-23 ENCOUNTER — Other Ambulatory Visit: Payer: Self-pay

## 2019-12-23 ENCOUNTER — Encounter: Payer: Self-pay | Admitting: Family Medicine

## 2019-12-23 ENCOUNTER — Ambulatory Visit (INDEPENDENT_AMBULATORY_CARE_PROVIDER_SITE_OTHER): Payer: BC Managed Care – PPO | Admitting: Family Medicine

## 2019-12-23 ENCOUNTER — Ambulatory Visit: Payer: BC Managed Care – PPO | Admitting: Family Medicine

## 2019-12-23 DIAGNOSIS — M1711 Unilateral primary osteoarthritis, right knee: Secondary | ICD-10-CM

## 2019-12-23 NOTE — Progress Notes (Signed)
Office Visit Note   Patient: Benjamin Rosales.           Date of Birth: 06-12-1955           MRN: [id number] Visit Date: 12/23/2019 Requested by: [place], Betty G, MD [address] [place],  [place] [address] PCP: [place], Betty G, MD  Subjective: Chief Complaint  Patient presents with  . Right Knee - Pain    prolotherapy injection    HPI: He is here for follow-up right knee pain.  Insurance did not approve gel injections.  He did not get any relief from cortisone.  Continued pain on the medial aspect of his knee with some swelling.              ROS:   All other systems were reviewed and are negative.  Objective: Vital Signs: There were no vitals taken for this visit.  Physical Exam:  General:  Alert and oriented, in no acute distress. Pulm:  Breathing unlabored. Psy:  Normal mood, congruent affect. Skin: No erythema Right knee: 1+ effusion with no warmth.  Tender on the medial joint line.  Imaging: No results found.  Assessment & Plan: 1.  Right knee end-stage medial compartment DJD -Discussed with him and elected to try dextrose prolotherapy injections.  First 1 today, repeat in 2 to 3 weeks and do a third 2 or 3 weeks after that.  We can then do them as needed if they help.     Procedures: Right knee aspiration and injection: After sterile prep with Betadine, injected 5 cc 1% lidocaine without epinephrine, then aspirated 10 cc of clear yellow synovial fluid, then injected 6 cc 1% lidocaine without epinephrine and 4 cc 50% dextrose from superomedial approach.   PMFS History: Patient Active Problem List   Diagnosis Date Noted  . Hyperlipidemia associated with type 2 diabetes mellitus (HCC) 08/13/2019  . Hypertension, essential, benign 05/22/2019  . Morbid obesity (HCC) 05/22/2019  . DM (diabetes mellitus), type 2 with peripheral vascular complications (HCC) 04/24/2019  . Lacunar stroke (HCC) 04/12/2019   Past Medical History:  Diagnosis Date  .  Alcohol abuse   . Decreased pedal pulses    Right foot  . Diabetes mellitus without complication (HCC)   . Hypercholesteremia   . Hypertension   . Obesity   . Right inguinal hernia   . 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  . Umbilical hernia     Family History  Problem Relation Age of Onset  . High blood pressure Father   . Heart disease Father   . High blood pressure Brother   . Diabetes Brother     Past Surgical History:  Procedure Laterality Date  . INGUINAL HERNIA REPAIR N/A 08/29/2019   Procedure: RIGHT INGUINAL HERNIA REPAIR WITH MESH;  Surgeon: Abigail Miyamoto, MD;  Location: [facility];  Service: General;  Laterality: N/A;  . TONSILLECTOMY    . UMBILICAL HERNIA REPAIR N/A 08/29/2019   Procedure: UMBILICAL HERNIA REPAIR WITH MESH;  Surgeon: Abigail Miyamoto, MD;  Location: [facility];  Service: General;  Laterality: N/A;   Social History   Occupational History    Comment: NA  Tobacco Use  . Smoking status: Never Smoker  . Smokeless tobacco: Never Used  Vaping Use  . Vaping Use: Never used  Substance and Sexual Activity  . Alcohol use: Not Currently    Alcohol/week: 28.0 standard drinks    Types: 28 Shots of liquor per week  Comment: 1 pint of liquor per day, 05/20/19 none  . Drug use: Never  . Sexual activity: Not on file

## 2020-01-06 ENCOUNTER — Other Ambulatory Visit: Payer: Self-pay

## 2020-01-06 ENCOUNTER — Encounter: Payer: Self-pay | Admitting: Family Medicine

## 2020-01-06 ENCOUNTER — Ambulatory Visit (INDEPENDENT_AMBULATORY_CARE_PROVIDER_SITE_OTHER): Payer: BC Managed Care – PPO | Admitting: Family Medicine

## 2020-01-06 DIAGNOSIS — M1711 Unilateral primary osteoarthritis, right knee: Secondary | ICD-10-CM

## 2020-01-06 NOTE — Progress Notes (Signed)
   Office Visit Note   Patient: Benjamin Rosales.           Date of Birth: 30-Jul-1955           MRN: [id number] Visit Date: 01/06/2020 Requested by: [place], Betty G, MD [address] [place],  [place] [address] PCP: [place], Betty G, MD  Subjective: Chief Complaint  Patient presents with  . Right Knee - Pain, Follow-up    prolotherapy #2    HPI: He is here for right knee dextrose prolotherapy #2.  He had very good response to the first injection.  He is pleased with his results.              ROS:   All other systems were reviewed and are negative.  Objective: Vital Signs: There were no vitals taken for this visit.  Physical Exam:  General:  Alert and oriented, in no acute distress. Pulm:  Breathing unlabored. Psy:  Normal mood, congruent affect. Skin: No erythema Right knee: 1+ effusion with no warmth.  Tender on the medial joint line.  Imaging: No results found.  Assessment & Plan: 1.  Right knee medial compartment DJD -Dextrose prolotherapy #2 today, follow-up in 2 to 3 weeks for the third.  After that we will do them when needed.     Procedures: Right knee injection: After sterile prep with Betadine, injected 6 cc 1% lidocaine without epinephrine and 4 cc 50% dextrose from superomedial approach, a flash of clear yellow synovial fluid was obtained prior to injection.    PMFS History: Patient Active Problem List   Diagnosis Date Noted  . Hyperlipidemia associated with type 2 diabetes mellitus (HCC) 08/13/2019  . Hypertension, essential, benign 05/22/2019  . Morbid obesity (HCC) 05/22/2019  . DM (diabetes mellitus), type 2 with peripheral vascular complications (HCC) 04/24/2019  . Lacunar stroke (HCC) 04/12/2019   Past Medical History:  Diagnosis Date  . Alcohol abuse   . Decreased pedal pulses    Right foot  . Diabetes mellitus without complication (HCC)   . Hypercholesteremia   . Hypertension   . Obesity   . Right inguinal hernia   . Stroke  (HCC) 04/2019   left pontine ischemic infarction  . Umbilical hernia     Family History  Problem Relation Age of Onset  . High blood pressure Father   . Heart disease Father   . High blood pressure Brother   . Diabetes Brother     Past Surgical History:  Procedure Laterality Date  . INGUINAL HERNIA REPAIR N/A 08/29/2019   Procedure: RIGHT INGUINAL HERNIA REPAIR WITH MESH;  Surgeon: Abigail Miyamoto, MD;  Location: [facility];  Service: General;  Laterality: N/A;  . TONSILLECTOMY    . UMBILICAL HERNIA REPAIR N/A 08/29/2019   Procedure: UMBILICAL HERNIA REPAIR WITH MESH;  Surgeon: Abigail Miyamoto, MD;  Location: [facility];  Service: General;  Laterality: N/A;   Social History   Occupational History    Comment: NA  Tobacco Use  . Smoking status: Never Smoker  . Smokeless tobacco: Never Used  Vaping Use  . Vaping Use: Never used  Substance and Sexual Activity  . Alcohol use: Not Currently    Alcohol/week: 28.0 standard drinks    Types: 28 Shots of liquor per week    Comment: 1 pint of liquor per day, 05/20/19 none  . Drug use: Never  . Sexual activity: Not on file

## 2020-02-12 ENCOUNTER — Telehealth: Payer: Self-pay | Admitting: Family Medicine

## 2020-02-12 ENCOUNTER — Other Ambulatory Visit: Payer: Self-pay

## 2020-02-12 ENCOUNTER — Encounter: Payer: Self-pay | Admitting: Family Medicine

## 2020-02-12 ENCOUNTER — Ambulatory Visit (INDEPENDENT_AMBULATORY_CARE_PROVIDER_SITE_OTHER): Payer: BC Managed Care – PPO | Admitting: Family Medicine

## 2020-02-12 VITALS — BP 138/78 | HR 63 | Temp 97.6°F | Resp 16 | Ht 67.0 in | Wt 270.2 lb

## 2020-02-12 DIAGNOSIS — Z1159 Encounter for screening for other viral diseases: Secondary | ICD-10-CM

## 2020-02-12 DIAGNOSIS — Z0000 Encounter for general adult medical examination without abnormal findings: Secondary | ICD-10-CM

## 2020-02-12 DIAGNOSIS — Z125 Encounter for screening for malignant neoplasm of prostate: Secondary | ICD-10-CM

## 2020-02-12 DIAGNOSIS — Z1211 Encounter for screening for malignant neoplasm of colon: Secondary | ICD-10-CM

## 2020-02-12 DIAGNOSIS — E1151 Type 2 diabetes mellitus with diabetic peripheral angiopathy without gangrene: Secondary | ICD-10-CM

## 2020-02-12 NOTE — Patient Instructions (Signed)
A few things to remember from today's visit:   Routine general medical examination at a health care facility  Colon cancer screening - Plan: Ambulatory referral to Gastroenterology  DM (diabetes mellitus), type 2 with peripheral vascular complications (HCC) - Plan: BASIC METABOLIC PANEL WITH GFR, Microalbumin / creatinine urine ratio, Hemoglobin A1c, Fructosamine  Encounter for HCV screening test for low risk patient - Plan: Hepatitis C antibody  Prostate cancer screening - Plan: PSA  If you need refills please call your pharmacy. Do not use My Chart to request refills or for acute issues that need immediate attention.   At least 150 minutes of moderate exercise per week, daily brisk walking for 15-30 min is a good exercise option. Healthy diet low in saturated (animal) fats and sweets and consisting of fresh fruits and vegetables, lean meats such as fish and white chicken and whole grains.  - Vaccines:  Tdap vaccine every 10 years.  Shingles vaccine recommended at age 25, could be given after 62 years of age but not sure about insurance coverage.  Pneumonia vaccines: Pneumovax at 11  -Screening recommendations for low/normal risk males:  Screening for diabetes at age 49 and every 3 years. Earlier screening if cardiovascular risk factors.N/A  Lipid screening at 35 and every 3 years. Screening starts in younger males with cardiovascular risk factors.N/A  Colon cancer screening is now at age 36 but your insurance may not cover until age 23 .screening is recommended age 24.  Prostate cancer screening: some controversy, starts usually at 50: Rectal exam and PSA.  Aortic Abdominal Aneurism once between 35 and 58 years old if ever smoker.  Also recommended:  1. Dental visit- Brush and floss your teeth twice daily; visit your dentist twice a year. 2. Eye doctor- Get an eye exam at least every 2 years. 3. Helmet use- Always wear a helmet when riding a bicycle, motorcycle,  rollerblading or skateboarding. 4. Safe sex- If you may be exposed to sexually transmitted infections, use a condom. 5. Seat belts- Seat belts can save your live; always wear one. 6. Smoke/Carbon Monoxide detectors- These detectors need to be installed on the appropriate level of your home. Replace batteries at least once a year. 7. Skin cancer- When out in the sun please cover up and use sunscreen 15 SPF or higher. 8. Violence- If anyone is threatening or hurting you, please tell your healthcare provider.  9. Drink alcohol in moderation- Limit alcohol intake to one drink or less per day. Never drink and drive.   Please be sure medication list is accurate. If a new problem present, please set up appointment sooner than planned today.

## 2020-02-12 NOTE — Telephone Encounter (Signed)
Called and left pt vm to call and set appt with Dr. Prince Rome for knee injections.

## 2020-02-12 NOTE — Progress Notes (Signed)
HPI:  Mr. Benjamin Rosales. is a 64 y.o.male here today with his friend for his routine physical examination.  Last CPE: > a year. He lives alone.  Regular exercise 3 or more times per week: He has not been consistent. Following a healthy diet: Yes.  Chronic medical problems: Obesity,CVA,HTN,HLD,and OA among some.  Immunization History  Administered Date(s) Administered  . PFIZER SARS-COV-2 Vaccination 06/06/2019, 07/01/2019  . Pneumococcal Polysaccharide-23 04/24/2019  . Tdap 04/24/2019   -Hep C screening: 04/13/19 NR  Last colon cancer screening: Never. Last prostate ca screening: Never.  He has appt with urologist. Nocturia x 2.  Negative high alcohol intake or tobacco use.  -Concerns and/or follow up today:   DM II: He is on Metformin 500 mg bid. HgA1C was 6.1 on 08/13/19.  HLD: He is on Atorvastatin 40 mg daily. Lab Results  Component Value Date   CHOL 114 08/14/2019   HDL 41.80 08/14/2019   LDLCALC 58 08/14/2019   TRIG 71.0 08/14/2019   CHOLHDL 3 08/14/2019   Review of Systems  Constitutional: Negative for activity change, appetite change, fatigue and fever.  HENT: Negative for dental problem, nosebleeds and sore throat.   Eyes: Negative for redness and visual disturbance.  Respiratory: Negative for cough, shortness of breath and wheezing.   Cardiovascular: Negative for chest pain, palpitations and leg swelling.  Gastrointestinal: Negative for abdominal pain, blood in stool, nausea and vomiting.  Endocrine: Negative for cold intolerance, heat intolerance, polydipsia, polyphagia and polyuria.  Genitourinary: Negative for decreased urine volume, dysuria, genital sores, hematuria and testicular pain.  Musculoskeletal: Negative for gait problem and myalgias.  Skin: Negative for color change and rash.  Neurological: Negative for syncope, weakness and headaches.  Hematological: Negative for adenopathy. Does not bruise/bleed easily.   Psychiatric/Behavioral: Negative for confusion and sleep disturbance. The patient is not nervous/anxious.    Current Outpatient Medications on File Prior to Visit  Medication Sig Dispense Refill  . amLODipine (NORVASC) 10 MG tablet Take 1 tablet (10 mg total) by mouth daily. 90 tablet 1  . aspirin 81 MG chewable tablet Chew 1 tablet (81 mg total) by mouth daily. 30 tablet 11  . atorvastatin (LIPITOR) 40 MG tablet Take 1 tablet (40 mg total) by mouth daily at 6 PM. 90 tablet 3  . Cyanocobalamin (VITAMIN B12) 1000 MCG TBCR Take 1,000 mcg by mouth daily. 90 tablet 1  . folic acid (FOLVITE) 1 MG tablet TAKE 1 TABLET BY MOUTH EVERY DAY (Patient taking differently: Take 1 mg by mouth daily. ) 90 tablet 2  . glucose blood (ONETOUCH VERIO) test strip Use to test 1-2 times daily. 200 each 12  . hydrALAZINE (APRESOLINE) 25 MG tablet Take 1 tablet (25 mg total) by mouth 3 (three) times daily. 270 tablet 1  . hydrochlorothiazide (HYDRODIURIL) 25 MG tablet Take 1 tablet (25 mg total) by mouth daily. 90 tablet 1  . metFORMIN (GLUCOPHAGE) 500 MG tablet Take 1 tablet (500 mg total) by mouth 2 (two) times daily with a meal. 180 tablet 1  . Multiple Vitamin (MULTIVITAMIN) tablet Take 1 tablet by mouth daily.    . mupirocin ointment (BACTROBAN) 2 % Place 1 application into the nose 2 (two) times daily. 30 g 0  . thiamine 100 MG tablet Take 1 tablet (100 mg total) by mouth daily. 90 tablet 1   No current facility-administered medications on file prior to visit.   Past Medical History:  Diagnosis Date  . Alcohol abuse   .  Decreased pedal pulses    Right foot  . Diabetes mellitus without complication (HCC)   . Hypercholesteremia   . Hypertension   . Obesity   . Right inguinal hernia   . 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  . Umbilical hernia     Past Surgical History:  Procedure Laterality Date  . INGUINAL HERNIA REPAIR N/A 08/29/2019   Procedure: RIGHT INGUINAL HERNIA REPAIR WITH  MESH;  Surgeon: Abigail MiyamotoBlackman, Douglas, MD;  Location: [facility];  Service: General;  Laterality: N/A;  . TONSILLECTOMY    . UMBILICAL HERNIA REPAIR N/A 08/29/2019   Procedure: UMBILICAL HERNIA REPAIR WITH MESH;  Surgeon: Abigail MiyamotoBlackman, Douglas, MD;  Location: [facility];  Service: General;  Laterality: N/A;    No Known Allergies  Family History  Problem Relation Age of Onset  . High blood pressure Father   . Heart disease Father   . High blood pressure Brother   . Diabetes Brother     Social History   Socioeconomic History  . Marital status: Single    Spouse name: Not on file  . Number of children: 0  . Years of education: 112  . Highest education level: Not on file  Occupational History    Comment: NA  Tobacco Use  . Smoking status: Never Smoker  . Smokeless tobacco: Never Used  Vaping Use  . Vaping Use: Never used  Substance and Sexual Activity  . Alcohol use: Not Currently    Alcohol/week: 28.0 standard drinks    Types: 28 Shots of liquor per week    Comment: 1 pint of liquor per day, 05/20/19 none  . Drug use: Never  . Sexual activity: Not on file  Other Topics Concern  . Not on file  Social History Narrative   Lives alone   Caffeine- none   Social Determinants of Health   Financial Resource Strain: Not on file  Food Insecurity: Not on file  Transportation Needs: Not on file  Physical Activity: Not on file  Stress: Not on file  Social Connections: Not on file   Vitals:   02/12/20 1031  BP: 138/78  Pulse: 63  Resp: 16  Temp: 97.6 F (36.4 C)  SpO2: 97%   Body mass index is 42.32 kg/m.  Wt Readings from Last 3 Encounters:  02/12/20 270 lb 3.2 oz (122.6 kg)  08/20/19 272 lb (123.4 kg)  08/19/19 271 lb (122.9 kg)   Physical Exam Vitals and nursing note reviewed.  Constitutional:      General: He is not in acute distress.    Appearance: He is well-developed.  HENT:     Head: Atraumatic.     Right Ear: Tympanic membrane, ear canal and external ear normal.      Left Ear: Tympanic membrane, ear canal and external ear normal.     Mouth/Throat:     Mouth: Oropharynx is clear and moist and mucous membranes are normal. Mucous membranes are moist.     Pharynx: Oropharynx is clear.  Eyes:     Extraocular Movements: Extraocular movements intact and EOM normal.     Conjunctiva/sclera: Conjunctivae normal.     Pupils: Pupils are equal, round, and reactive to light.  Neck:     Thyroid: No thyromegaly.     Trachea: No tracheal deviation.  Cardiovascular:     Rate and Rhythm: Normal rate and regular rhythm.     Heart sounds: No murmur heard.     Comments: DP pulses present, bilateral. Pulmonary:  Effort: Pulmonary effort is normal. No respiratory distress.     Breath sounds: Normal breath sounds.  Chest:  Breasts:     Right: No supraclavicular adenopathy.     Left: No supraclavicular adenopathy.    Abdominal:     Palpations: Abdomen is soft. There is no hepatomegaly or mass.     Tenderness: There is no abdominal tenderness.  Genitourinary:    Comments: No concerns. Musculoskeletal:        General: No tenderness or edema.     Cervical back: Normal range of motion.     Comments: No signs of synovitis.  Lymphadenopathy:     Cervical: No cervical adenopathy.     Upper Body:     Right upper body: No supraclavicular adenopathy.     Left upper body: No supraclavicular adenopathy.     Comments: Right sumbandibular gland slightly bigger, not tender. Defined borders.Pt states that has been same size for year.   Skin:    General: Skin is warm.     Findings: No erythema.  Neurological:     Mental Status: He is alert and oriented to person, place, and time.     Cranial Nerves: No cranial nerve deficit.     Sensory: No sensory deficit.     Coordination: Coordination normal.     Gait: Gait normal.     Deep Tendon Reflexes: Strength normal.     Reflex Scores:      Bicep reflexes are 2+ on the right side and 2+ on the left side.      Patellar  reflexes are 2+ on the right side and 2+ on the left side. Psychiatric:        Mood and Affect: Mood and affect normal.        Cognition and Memory: Cognition and memory normal.   ASSESSMENT AND PLAN:  Mr.Benford was seen today for annual exam.  Diagnoses and all orders for this visit: Orders Placed This Encounter  Procedures  . BASIC METABOLIC PANEL WITH GFR  . Microalbumin / creatinine urine ratio  . Hemoglobin A1c  . Hepatitis C antibody  . Fructosamine  . PSA  . Ambulatory referral to Gastroenterology   Lab Results  Component Value Date   PSA 0.50 02/12/2020   Lab Results  Component Value Date   CREATININE 0.88 02/12/2020   BUN 16 02/12/2020   NA 139 02/12/2020   K 4.7 02/12/2020   CL 100 02/12/2020   CO2 26 02/12/2020   Lab Results  Component Value Date   HGBA1C 5.9 (H) 02/12/2020   Lab Results  Component Value Date   MICROALBUR 1.2 04/30/2019    Routine general medical examination at a health care facility He understands the importance of regular physical activity and healthy diet for prevention of chronic illness and/or complications. Preventive guidelines reviewed. Vaccination: Refused Shingrix and flu vaccine. Next CPE in a year.  Colon cancer screening -     Ambulatory referral to Gastroenterology  DM (diabetes mellitus), type 2 with peripheral vascular complications (HCC) Problem has been well controlled. Continue Metformin 500 mg bid. Further recommendations will be given according to HgA1C result.  Encounter for HCV screening test for low risk patient -     Hepatitis C antibody  Prostate cancer screening -     PSA  Return in 6 months (on 08/12/2020) for DM II,HLD,HTN.   Damia Bobrowski G. [place], MD  [facility]. [place] [facility].  A few things to remember from today's visit:  Routine general medical examination at a health care facility  Colon cancer screening - Plan: Ambulatory referral to Gastroenterology  DM (diabetes  mellitus), type 2 with peripheral vascular complications (HCC) - Plan: BASIC METABOLIC PANEL WITH GFR, Microalbumin / creatinine urine ratio, Hemoglobin A1c, Fructosamine  Encounter for HCV screening test for low risk patient - Plan: Hepatitis C antibody  Prostate cancer screening - Plan: PSA  If you need refills please call your pharmacy. Do not use My Chart to request refills or for acute issues that need immediate attention.   At least 150 minutes of moderate exercise per week, daily brisk walking for 15-30 min is a good exercise option. Healthy diet low in saturated (animal) fats and sweets and consisting of fresh fruits and vegetables, lean meats such as fish and white chicken and whole grains.  - Vaccines:  Tdap vaccine every 10 years.  Shingles vaccine recommended at age 33, could be given after 62 years of age but not sure about insurance coverage.  Pneumonia vaccines: Pneumovax at 58  -Screening recommendations for low/normal risk males:  Screening for diabetes at age 62 and every 3 years. Earlier screening if cardiovascular risk factors.N/A  Lipid screening at 35 and every 3 years. Screening starts in younger males with cardiovascular risk factors.N/A  Colon cancer screening is now at age 31 but your insurance may not cover until age 81 .screening is recommended age 62.  Prostate cancer screening: some controversy, starts usually at 50: Rectal exam and PSA.  Aortic Abdominal Aneurism once between 29 and 9 years old if ever smoker.  Also recommended:  1. Dental visit- Brush and floss your teeth twice daily; visit your dentist twice a year. 2. Eye doctor- Get an eye exam at least every 2 years. 3. Helmet use- Always wear a helmet when riding a bicycle, motorcycle, rollerblading or skateboarding. 4. Safe sex- If you may be exposed to sexually transmitted infections, use a condom. 5. Seat belts- Seat belts can save your live; always wear one. 6. Smoke/Carbon Monoxide  detectors- These detectors need to be installed on the appropriate level of your home. Replace batteries at least once a year. 7. Skin cancer- When out in the sun please cover up and use sunscreen 15 SPF or higher. 8. Violence- If anyone is threatening or hurting you, please tell your healthcare provider.  9. Drink alcohol in moderation- Limit alcohol intake to one drink or less per day. Never drink and drive.   Please be sure medication list is accurate. If a new problem present, please set up appointment sooner than planned today.

## 2020-02-14 ENCOUNTER — Telehealth: Payer: Self-pay | Admitting: Family Medicine

## 2020-02-14 NOTE — Telephone Encounter (Signed)
Called pt left 2nd vm for pt to call and set appt with Dr. Prince Rome for injections in knees

## 2020-02-17 ENCOUNTER — Telehealth: Payer: Self-pay | Admitting: Family Medicine

## 2020-02-17 NOTE — Telephone Encounter (Signed)
Called pt several time s and left vm. Will complete. No contact.

## 2020-02-18 LAB — BASIC METABOLIC PANEL WITH GFR
BUN: 16 mg/dL (ref 7–25)
CO2: 26 mmol/L (ref 20–32)
Calcium: 9.5 mg/dL (ref 8.6–10.3)
Chloride: 100 mmol/L (ref 98–110)
Creat: 0.88 mg/dL (ref 0.70–1.25)
GFR, Est African American: 107 mL/min/{1.73_m2} (ref >=60)
GFR, Est Non African American: 92 mL/min/{1.73_m2} (ref >=60)
Glucose, Bld: 117 mg/dL — ABNORMAL HIGH (ref 65–99)
Potassium: 4.7 mmol/L (ref 3.5–5.3)
Sodium: 139 mmol/L (ref 135–146)

## 2020-02-18 LAB — HEMOGLOBIN A1C
Hgb A1c MFr Bld: 5.9 % of total Hgb — ABNORMAL HIGH (ref <5.7)
Mean Plasma Glucose: 123 mg/dL
eAG (mmol/L): 6.8 mmol/L

## 2020-02-18 LAB — HEPATITIS C ANTIBODY
Hepatitis C Ab: NONREACTIVE
SIGNAL TO CUT-OFF: 0.01 (ref <1.00)

## 2020-02-18 LAB — FRUCTOSAMINE: Fructosamine: 250 umol/L (ref 205–285)

## 2020-02-18 LAB — PSA: PSA: 0.5 ng/mL (ref <=4.0)

## 2020-02-27 ENCOUNTER — Other Ambulatory Visit: Payer: Self-pay

## 2020-02-27 ENCOUNTER — Ambulatory Visit (INDEPENDENT_AMBULATORY_CARE_PROVIDER_SITE_OTHER): Payer: BC Managed Care – PPO | Admitting: Family Medicine

## 2020-02-27 DIAGNOSIS — M1711 Unilateral primary osteoarthritis, right knee: Secondary | ICD-10-CM | POA: Diagnosis not present

## 2020-02-27 NOTE — Progress Notes (Signed)
   Office Visit Note   Patient: Benjamin Rosales.           Date of Birth: 09-Mar-1955           MRN: [id number] Visit Date: 02/27/2020 Requested by: [place], Betty G, MD [address] [place],  [place] [address] PCP: [place], Betty G, MD  Subjective: Chief Complaint  Patient presents with  . Right Knee - Pain, Follow-up    Prolotherapy injection #3    HPI: He is here for dextrose prolotherapy #3 4 right knee DJD.  He had good results so far.              ROS:   All other systems were reviewed and are negative.  Objective: Vital Signs: There were no vitals taken for this visit.  Physical Exam:  General:  Alert and oriented, in no acute distress. Pulm:  Breathing unlabored. Psy:  Normal mood, congruent affect.  Right knee: 1+ effusion with no warmth.  Tender on the medial joint line.  Imaging: No results found.  Assessment & Plan: 1.  Right knee DJD, medial compartment -Dextrose prolotherapy #3 today.  We can repeat these as needed.  He is also interested in cash pay option for gel injections, so I will investigate that for him.     Procedures: Right knee injection: After sterile prep with Betadine, injected 6 cc 0.25% bupivacaine and 4 cc 50% dextrose from superomedial approach, a flash of clear yellow synovial fluid was obtained prior to injection.       PMFS History: Patient Active Problem List   Diagnosis Date Noted  . Hyperlipidemia associated with type 2 diabetes mellitus (HCC) 08/13/2019  . Hypertension, essential, benign 05/22/2019  . Morbid obesity (HCC) 05/22/2019  . DM (diabetes mellitus), type 2 with peripheral vascular complications (HCC) 04/24/2019  . Lacunar stroke (HCC) 04/12/2019   Past Medical History:  Diagnosis Date  . Alcohol abuse   . Decreased pedal pulses    Right foot  . Diabetes mellitus without complication (HCC)   . Hypercholesteremia   . Hypertension   . Obesity   . Right inguinal hernia   . Stroke (HCC) 04/2019    left pontine ischemic infarction  . Umbilical hernia     Family History  Problem Relation Age of Onset  . High blood pressure Father   . Heart disease Father   . High blood pressure Brother   . Diabetes Brother     Past Surgical History:  Procedure Laterality Date  . INGUINAL HERNIA REPAIR N/A 08/29/2019   Procedure: RIGHT INGUINAL HERNIA REPAIR WITH MESH;  Surgeon: Abigail Miyamoto, MD;  Location: [facility];  Service: General;  Laterality: N/A;  . TONSILLECTOMY    . UMBILICAL HERNIA REPAIR N/A 08/29/2019   Procedure: UMBILICAL HERNIA REPAIR WITH MESH;  Surgeon: Abigail Miyamoto, MD;  Location: [facility];  Service: General;  Laterality: N/A;   Social History   Occupational History    Comment: NA  Tobacco Use  . Smoking status: Never Smoker  . Smokeless tobacco: Never Used  Vaping Use  . Vaping Use: Never used  Substance and Sexual Activity  . Alcohol use: Not Currently    Alcohol/week: 28.0 standard drinks    Types: 28 Shots of liquor per week    Comment: 1 pint of liquor per day, 05/20/19 none  . Drug use: Never  . Sexual activity: Not on file

## 2020-03-02 ENCOUNTER — Encounter: Payer: Self-pay | Admitting: Family Medicine

## 2020-04-16 ENCOUNTER — Other Ambulatory Visit: Payer: Self-pay | Admitting: Family Medicine

## 2020-04-16 DIAGNOSIS — E1169 Type 2 diabetes mellitus with other specified complication: Secondary | ICD-10-CM

## 2020-04-16 DIAGNOSIS — E785 Hyperlipidemia, unspecified: Secondary | ICD-10-CM

## 2020-04-19 ENCOUNTER — Other Ambulatory Visit: Payer: Self-pay | Admitting: Family Medicine

## 2020-04-19 DIAGNOSIS — I10 Essential (primary) hypertension: Secondary | ICD-10-CM

## 2020-06-16 ENCOUNTER — Other Ambulatory Visit: Payer: Self-pay | Admitting: Family Medicine

## 2020-06-17 ENCOUNTER — Other Ambulatory Visit: Payer: Self-pay | Admitting: Family Medicine

## 2020-07-13 ENCOUNTER — Telehealth: Payer: Self-pay

## 2020-07-13 NOTE — Telephone Encounter (Signed)
Would he be a candidate for Trivisc from [facility]?  Ok to order.

## 2020-07-13 NOTE — Telephone Encounter (Signed)
pts wife called and would like another order to be sent in to get the gel injection and they are okay with being self pay.

## 2020-07-14 NOTE — Telephone Encounter (Signed)
Patient would be good to try TriVisc.  Called and left a VM for patient to give me a call back if he wanted to proceed with TriVisc series.

## 2020-07-17 ENCOUNTER — Encounter: Payer: Self-pay | Admitting: Family Medicine

## 2020-07-20 ENCOUNTER — Telehealth: Payer: Self-pay

## 2020-07-20 NOTE — Telephone Encounter (Signed)
Submitted for TriVisc, right knee.  [facility] will contact patient for payment.

## 2020-07-29 ENCOUNTER — Telehealth: Payer: Self-pay

## 2020-07-29 NOTE — Telephone Encounter (Signed)
FYI- Received a fax from [facility] ([facility]isc) stating that case has been closed due to no response from patient.

## 2020-08-11 ENCOUNTER — Telehealth: Payer: Self-pay | Admitting: Family Medicine

## 2020-08-11 NOTE — Telephone Encounter (Signed)
Betsey pts wife called stating she would like to go ahead and pay for the gel injections; she would like a CB tomorrow morning.   [phone]

## 2020-08-12 NOTE — Telephone Encounter (Signed)
Talked with Tamela Oddi and advised her that she would pay with [facility] for TriVisc, right knee.

## 2020-08-20 ENCOUNTER — Telehealth: Payer: Self-pay

## 2020-08-20 NOTE — Telephone Encounter (Signed)
Called and left a Vm for patient to CB to schedule for gel injection with Dr.Hilts.

## 2020-08-26 ENCOUNTER — Encounter: Payer: Self-pay | Admitting: Family Medicine

## 2020-08-26 ENCOUNTER — Ambulatory Visit: Payer: BC Managed Care – PPO | Admitting: Family Medicine

## 2020-08-26 ENCOUNTER — Other Ambulatory Visit: Payer: Self-pay

## 2020-08-26 VITALS — BP 154/78 | HR 67 | Ht 69.0 in | Wt 282.6 lb

## 2020-08-26 DIAGNOSIS — M1711 Unilateral primary osteoarthritis, right knee: Secondary | ICD-10-CM

## 2020-08-26 NOTE — Progress Notes (Signed)
Subjective: He is here for Trivisc #1 for right knee osteoarthritis.  Objective:  1+ effusion  Procedure: Right knee injection: After sterile prep with Betadine, injected 3 cc 1% lidocaine without epinephrine and Trivisc from superolateral approach.  Plan: Follow-up in a week for the second of 3 injections.

## 2020-09-02 ENCOUNTER — Ambulatory Visit: Payer: BC Managed Care – PPO | Admitting: Family Medicine

## 2020-09-02 ENCOUNTER — Other Ambulatory Visit: Payer: Self-pay

## 2020-09-02 DIAGNOSIS — M1711 Unilateral primary osteoarthritis, right knee: Secondary | ICD-10-CM

## 2020-09-02 NOTE — Progress Notes (Signed)
Subjective: He is here for trivisc #2 for right knee osteoarthritis.  He notices improvement so far.  Objective: Trace to 1+ effusion with no warmth or erythema.  Procedure: Right knee injection: After sterile prep with Betadine, injected 3 cc 1% lidocaine without epinephrine and trivisc from superolateral approach, a flash of clear yellow synovial fluid was obtained prior to injection.  Follow-up in a week for the final injection.

## 2020-09-07 ENCOUNTER — Other Ambulatory Visit: Payer: Self-pay

## 2020-09-07 ENCOUNTER — Telehealth: Payer: Self-pay | Admitting: Family Medicine

## 2020-09-07 ENCOUNTER — Ambulatory Visit: Payer: BC Managed Care – PPO | Admitting: Family Medicine

## 2020-09-07 DIAGNOSIS — M1711 Unilateral primary osteoarthritis, right knee: Secondary | ICD-10-CM

## 2020-09-07 DIAGNOSIS — M1712 Unilateral primary osteoarthritis, left knee: Secondary | ICD-10-CM

## 2020-09-07 NOTE — Telephone Encounter (Signed)
Noted  

## 2020-09-07 NOTE — Progress Notes (Signed)
Subjective: He is here for right knee Trivisc injection #3.  Doing well so far.  He seems to be getting a lot of relief.  He is interested in doing this for the left knee.  Objective: 1+ effusion with no warmth or erythema.  Procedure: Right knee injection: After sterile prep with Betadine, injected 3 cc 1% lidocaine without epinephrine and Trivisc from superolateral approach, a flash of clear yellow synovial fluid was obtained prior to injection.

## 2020-09-07 NOTE — Telephone Encounter (Signed)
Requesting approval for left knee gel injections for OA. 

## 2020-09-25 ENCOUNTER — Telehealth: Payer: Self-pay

## 2020-09-25 NOTE — Telephone Encounter (Signed)
Submitted for TriVisc, left knee. Patient is aware how the process works.

## 2020-10-06 ENCOUNTER — Telehealth: Payer: Self-pay

## 2020-10-06 NOTE — Telephone Encounter (Signed)
Called and left a Vm for patient to CB to schedule for gel injection with Dr. Prince Rome.  TriVisc, Left Knee Patient Purchased.

## 2020-10-12 ENCOUNTER — Other Ambulatory Visit: Payer: Self-pay

## 2020-10-12 ENCOUNTER — Ambulatory Visit: Payer: BC Managed Care – PPO | Admitting: Family Medicine

## 2020-10-12 DIAGNOSIS — M1712 Unilateral primary osteoarthritis, left knee: Secondary | ICD-10-CM

## 2020-10-12 NOTE — Progress Notes (Signed)
Subjective: He is here for a planned Trivisc injection for left knee osteoarthritis.  He is doing well status post right knee injections.  Objective: No significant effusion, no warmth or erythema.  Procedure: Left knee injection: After sterile prep with Betadine, injected 3 cc 1% lidocaine without epinephrine and Trivisc from lateral midpatellar approach.  Follow-up in a week for the second of 3 injections.

## 2020-10-19 ENCOUNTER — Ambulatory Visit: Payer: BC Managed Care – PPO | Admitting: Family Medicine

## 2020-10-19 ENCOUNTER — Other Ambulatory Visit: Payer: Self-pay

## 2020-10-19 DIAGNOSIS — M1712 Unilateral primary osteoarthritis, left knee: Secondary | ICD-10-CM | POA: Diagnosis not present

## 2020-10-19 NOTE — Patient Instructions (Signed)
    Dr. August Saucer or Dr. Roda Shutters next week for Trivisc injection.     [email or web address]

## 2020-10-19 NOTE — Progress Notes (Signed)
Subjective: He is here for Trivisc #2 for left knee osteoarthritis.  Doing well so far.  Objective: Trace effusion with no warmth or erythema.  Procedure: Left knee injection: After sterile prep with Betadine, injected 5 cc 1% lidocaine without epinephrine and Trivisc from superolateral approach.  Follow-up in a week for the third and final injection.

## 2020-10-25 ENCOUNTER — Other Ambulatory Visit: Payer: Self-pay | Admitting: Family Medicine

## 2020-10-28 ENCOUNTER — Encounter: Payer: Self-pay | Admitting: Orthopaedic Surgery

## 2020-10-28 ENCOUNTER — Ambulatory Visit: Payer: BC Managed Care – PPO | Admitting: Orthopaedic Surgery

## 2020-10-28 ENCOUNTER — Other Ambulatory Visit: Payer: Self-pay

## 2020-10-28 DIAGNOSIS — M1712 Unilateral primary osteoarthritis, left knee: Secondary | ICD-10-CM | POA: Diagnosis not present

## 2020-10-28 MED ORDER — LIDOCAINE HCL 1 % IJ SOLN
2.0000 mL | INTRAMUSCULAR | Status: AC | PRN
  Administered 2020-10-28: 2 mL

## 2020-10-28 MED ORDER — SODIUM HYALURONATE (VISCOSUP) 25 MG/2.5ML IX SOSY
25.0000 mg | PREFILLED_SYRINGE | INTRA_ARTICULAR | Status: AC | PRN
  Administered 2020-10-28: 25 mg via INTRA_ARTICULAR

## 2020-10-28 MED ORDER — BUPIVACAINE HCL 0.25 % IJ SOLN
2.0000 mL | INTRAMUSCULAR | Status: AC | PRN
  Administered 2020-10-28: 2 mL via INTRA_ARTICULAR

## 2020-10-28 NOTE — Progress Notes (Signed)
Office Visit Note   Patient: Benjamin Rosales.           Date of Birth: 02/17/1956           MRN: [id number] Visit Date: 10/28/2020              Requested by: [place], Betty G, MD [address] [place],  [place] [address] PCP: [place], Betty G, MD   Assessment & Plan: Visit Diagnoses:  1. Unilateral primary osteoarthritis, left knee     Plan: Impression is left knee degenerative joint disease.  Today, proceed with a third trimester injection of the left knee.  He tolerated this well.  He will follow-up with [place] as needed.  Follow-Up Instructions: Return if symptoms worsen or fail to improve.   Orders:  Orders Placed This Encounter  Procedures   Large Joint Inj: L knee   No orders of the defined types were placed in this encounter.     Procedures: Large Joint Inj: L knee on 10/28/2020 11:26 AM Indications: pain Details: 22 G needle, anterolateral approach Medications: 2 mL lidocaine 1 %; 2 mL bupivacaine 0.25 %; 25 mg Sodium Hyaluronate 25 MG/2.5ML     Clinical Data: No additional findings.   Subjective: Chief Complaint  Patient presents with   Left Knee - Pain    HPI patient is a pleasant 65 year old patient of Dr. Prince Rome who comes in today for his third trimester injection to the left knee.  He has had good relief from the previous 2 injections.     Objective: Vital Signs: There were no vitals taken for this visit.    Ortho Exam stable left knee exam  Specialty Comments:  No specialty comments available.  Imaging: No new imaging   PMFS History: Patient Active Problem List   Diagnosis Date Noted   Hyperlipidemia associated with type 2 diabetes mellitus (HCC) 08/13/2019   Hypertension, essential, benign 05/22/2019   Morbid obesity (HCC) 05/22/2019   DM (diabetes mellitus), type 2 with peripheral vascular complications (HCC) 04/24/2019   Lacunar stroke (HCC) 04/12/2019   Past Medical History:  Diagnosis Date   Alcohol abuse     Decreased pedal pulses    Right foot   Diabetes mellitus without complication (HCC)    Hypercholesteremia    Hypertension    Obesity    Right inguinal hernia    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   Umbilical hernia     Family History  Problem Relation Age of Onset   High blood pressure Father    Heart disease Father    High blood pressure Brother    Diabetes Brother     Past Surgical History:  Procedure Laterality Date   INGUINAL HERNIA REPAIR N/A 08/29/2019   Procedure: RIGHT INGUINAL HERNIA REPAIR WITH MESH;  Surgeon: Abigail Miyamoto, MD;  Location: [facility];  Service: General;  Laterality: N/A;   TONSILLECTOMY     UMBILICAL HERNIA REPAIR N/A 08/29/2019   Procedure: UMBILICAL HERNIA REPAIR WITH MESH;  Surgeon: Abigail Miyamoto, MD;  Location: [facility];  Service: General;  Laterality: N/A;   Social History   Occupational History    Comment: NA  Tobacco Use   Smoking status: Never   Smokeless tobacco: Never  Vaping Use   Vaping Use: Never used  Substance and Sexual Activity   Alcohol use: Not Currently    Alcohol/week: 28.0 standard drinks    Types: 28 Shots of liquor per week  Comment: 1 pint of liquor per day, 05/20/19 none   Drug use: Never   Sexual activity: Not on file

## 2020-11-11 ENCOUNTER — Telehealth: Payer: Self-pay | Admitting: Family Medicine

## 2020-11-11 NOTE — Telephone Encounter (Signed)
Betsy called triage line on behalf of patient. Called her to schedule MyChart virtual visit with Dr. [place]. No answer, left voicemail message.  If Tamela Oddi calls back, schedule a virtual visit with Dr. [place]iland for 09/14 at 4:30pm (okay per Maralyn Sago).

## 2021-04-05 ENCOUNTER — Other Ambulatory Visit: Payer: Self-pay | Admitting: Family Medicine

## 2021-04-05 DIAGNOSIS — I10 Essential (primary) hypertension: Secondary | ICD-10-CM

## 2021-04-05 DIAGNOSIS — E785 Hyperlipidemia, unspecified: Secondary | ICD-10-CM

## 2021-04-05 DIAGNOSIS — E1169 Type 2 diabetes mellitus with other specified complication: Secondary | ICD-10-CM

## 2021-04-20 ENCOUNTER — Other Ambulatory Visit: Payer: Self-pay | Admitting: Family Medicine

## 2021-04-20 DIAGNOSIS — I10 Essential (primary) hypertension: Secondary | ICD-10-CM

## 2021-05-16 ENCOUNTER — Other Ambulatory Visit: Payer: Self-pay | Admitting: Family Medicine

## 2021-05-17 ENCOUNTER — Other Ambulatory Visit: Payer: Self-pay | Admitting: Family Medicine

## 2021-05-19 ENCOUNTER — Other Ambulatory Visit: Payer: Self-pay | Admitting: Family Medicine

## 2021-05-19 DIAGNOSIS — I10 Essential (primary) hypertension: Secondary | ICD-10-CM

## 2021-06-07 ENCOUNTER — Encounter: Payer: Self-pay | Admitting: Family Medicine

## 2021-06-07 ENCOUNTER — Ambulatory Visit: Payer: BC Managed Care – PPO | Admitting: Family Medicine

## 2021-06-07 VITALS — BP 130/80 | HR 72 | Resp 16 | Ht 69.0 in | Wt 291.4 lb

## 2021-06-07 DIAGNOSIS — E1169 Type 2 diabetes mellitus with other specified complication: Secondary | ICD-10-CM

## 2021-06-07 DIAGNOSIS — I10 Essential (primary) hypertension: Secondary | ICD-10-CM | POA: Diagnosis not present

## 2021-06-07 DIAGNOSIS — E785 Hyperlipidemia, unspecified: Secondary | ICD-10-CM | POA: Diagnosis not present

## 2021-06-07 DIAGNOSIS — E1151 Type 2 diabetes mellitus with diabetic peripheral angiopathy without gangrene: Secondary | ICD-10-CM

## 2021-06-07 DIAGNOSIS — I700 Atherosclerosis of aorta: Secondary | ICD-10-CM

## 2021-06-07 MED ORDER — HYDRALAZINE HCL 25 MG PO TABS: ORAL_TABLET | ORAL | 2 refills | Status: DC

## 2021-06-07 MED ORDER — HYDROCHLOROTHIAZIDE 25 MG PO TABS: ORAL_TABLET | ORAL | 1 refills | Status: DC

## 2021-06-07 NOTE — Assessment & Plan Note (Addendum)
Continue atorvastatin 40 mg daily and Aspirin 81 mg daily. ?

## 2021-06-07 NOTE — Progress Notes (Signed)
? ?HPI: ?Benjamin Rosales. is a 66 y.o. male, who is here today with his wife to follow on medications. ?He was last seen on 02/12/20. ?No new problems since his last visit. ?He has been following with orthopedics for knee pain and planning on total knee replacement. ? ?Hypertension:  ?+ CVA in 04/2019. ?Medications: Hydralazine 25 mg 3 times daily, amlodipine 10 mg daily, and HCTZ 25 mg daily. ?BP readings at home: He is not checking BP regularly, occasionally he has checked it at the grocery store, and 150s/80s. ?Side effects: None ?Negative for unusual or severe headache, visual changes, exertional chest pain, dyspnea, new focal weakness, or worsening edema. ? ?Lab Results  ?Component Value Date  ? CREATININE 0.88 02/12/2020  ? BUN 16 02/12/2020  ? NA 139 02/12/2020  ? K 4.7 02/12/2020  ? CL 100 02/12/2020  ? CO2 26 02/12/2020  ? ?Diabetes Mellitus II: Dx'ed in 04/2019. ?- Checking BG at home: Not checking. ?- Medications: Metformin 500 mg twice daily. ?- Diet: Not consistent, high intake of process food and sweets. ?- Exercise: Not regularly, he is active with yard work once per week. ?- eye exam: Has not had one in the past few years. ?- foot exam: Over a year ago.  ?- Negative for symptoms of hypoglycemia, polyuria, polydipsia, numbness extremities, foot ulcers/trauma ? ?Lab Results  ?Component Value Date  ? HGBA1C 5.9 (H) 02/12/2020  ? ?Lab Results  ?Component Value Date  ? MICROALBUR 1.2 04/30/2019  ? ?Hyperlipidemia: Currently he is on atorvastatin 40 mg daily. ?Tolerating medication well. ?Aortic atherosclerosis seen on imaging, abdominal CT in 05/2019. ?Lab Results  ?Component Value Date  ? CHOL 114 08/14/2019  ? HDL 41.80 08/14/2019  ? LDLCALC 58 08/14/2019  ? TRIG 71.0 08/14/2019  ? CHOLHDL 3 08/14/2019  ? ?Review of Systems  ?Constitutional:  Negative for activity change, appetite change and fever.  ?HENT:  Negative for nosebleeds, sore throat and trouble swallowing.   ?Respiratory:  Negative  for cough and wheezing.   ?Gastrointestinal:  Negative for abdominal pain, nausea and vomiting.  ?Genitourinary:  Negative for decreased urine volume and hematuria.  ?Musculoskeletal:  Positive for arthralgias. Negative for gait problem.  ?Skin:  Negative for rash.  ?Neurological:  Negative for syncope and facial asymmetry.  ?Psychiatric/Behavioral:  Negative for confusion. The patient is not nervous/anxious.   ?Rest see pertinent positives and negatives per HPI. ? ?Current Outpatient Medications on File Prior to Visit  ?Medication Sig Dispense Refill  ? amLODipine (NORVASC) 10 MG tablet Take 1 tablet (10 mg total) by mouth daily. Please call [phone] to schedule an appointment for further refills. 90 tablet 0  ? aspirin 81 MG chewable tablet Chew 1 tablet (81 mg total) by mouth daily. 30 tablet 11  ? atorvastatin (LIPITOR) 40 MG tablet Take 1 tablet (40 mg total) by mouth daily. Please call [phone] to schedule an appointment for further refills. 90 tablet 0  ? Cyanocobalamin (VITAMIN B12) 1000 MCG TBCR Take 1,000 mcg by mouth daily. 90 tablet 1  ? folic acid (FOLVITE) 1 MG tablet TAKE 1 TABLET BY MOUTH EVERY DAY (Patient taking differently: Take 1 mg by mouth daily.) 90 tablet 2  ? glucose blood (ONETOUCH VERIO) test strip Use to test 1-2 times daily. 200 each 12  ? metFORMIN (GLUCOPHAGE) 500 MG tablet TAKE 1 TABLET BY MOUTH TWICE DAILY WITH MEALS 60 tablet 0  ? Multiple Vitamin (MULTIVITAMIN) tablet Take 1 tablet by mouth daily.    ?  thiamine 100 MG tablet Take 1 tablet (100 mg total) by mouth daily. 90 tablet 1  ? ?No current facility-administered medications on file prior to visit.  ? ?Past Medical History:  ?Diagnosis Date  ? Alcohol abuse   ? Decreased pedal pulses   ? Right foot  ? Diabetes mellitus without complication (HCC)   ? Hypercholesteremia   ? Hypertension   ? Obesity   ? Right inguinal hernia   ? Stroke [facility]) 04/2019  ? left pontine ischemic infarction  ? Umbilical hernia   ? ?No Known  Allergies ? ?Social History  ? ?Socioeconomic History  ? Marital status: Single  ?  Spouse name: Not on file  ? Number of children: 0  ? Years of education: 13  ? Highest education level: Not on file  ?Occupational History  ?  Comment: NA  ?Tobacco Use  ? Smoking status: Never  ? Smokeless tobacco: Never  ?Vaping Use  ? Vaping Use: Never used  ?Substance and Sexual Activity  ? Alcohol use: Not Currently  ?  Alcohol/week: 28.0 standard drinks  ?  Types: 28 Shots of liquor per week  ?  Comment: 1 pint of liquor per day, 05/20/19 none  ? Drug use: Never  ? Sexual activity: Not on file  ?Other Topics Concern  ? Not on file  ?Social History Narrative  ? Lives alone  ? Caffeine- none  ? ?Social Determinants of Health  ? ?Financial Resource Strain: Not on file  ?Food Insecurity: Not on file  ?Transportation Needs: Not on file  ?Physical Activity: Not on file  ?Stress: Not on file  ?Social Connections: Not on file  ? ?Vitals:  ? 06/07/21 1536  ?BP: 130/80  ?Pulse: 72  ?Resp: 16  ?SpO2: 97%  ? ?Wt Readings from Last 3 Encounters:  ?06/07/21 291 lb 6 oz (132.2 kg)  ?08/26/20 282 lb 9.6 oz (128.2 kg)  ?02/12/20 270 lb 3.2 oz (122.6 kg)  ? ?Body mass index is 43.03 kg/m?. ? ?Physical Exam ?Vitals and nursing note reviewed.  ?Constitutional:   ?   General: He is not in acute distress. ?   Appearance: He is well-developed.  ?HENT:  ?   Head: Normocephalic and atraumatic.  ?   Mouth/Throat:  ?   Mouth: Mucous membranes are moist.  ?   Pharynx: Oropharynx is clear.  ?Eyes:  ?   Conjunctiva/sclera: Conjunctivae normal.  ?Cardiovascular:  ?   Rate and Rhythm: Normal rate and regular rhythm.  ?   Heart sounds: No murmur heard. ?   Comments: DP pulses present. ?Pulmonary:  ?   Effort: Pulmonary effort is normal. No respiratory distress.  ?   Breath sounds: Normal breath sounds.  ?Abdominal:  ?   Palpations: Abdomen is soft.  ?   Tenderness: There is no abdominal tenderness.  ?Musculoskeletal:  ?   Right lower leg: 1+ Pitting Edema  present.  ?   Left lower leg: 1+ Pitting Edema present.  ?Lymphadenopathy:  ?   Cervical: No cervical adenopathy.  ?Skin: ?   General: Skin is warm.  ?   Findings: No erythema or rash.  ?Neurological:  ?   Mental Status: He is alert and oriented to person, place, and time.  ?   Cranial Nerves: No cranial nerve deficit.  ?   Gait: Gait normal.  ?   Comments: Stable , mildly antalgic gait, not assisted.  ?Psychiatric:  ?   Comments: Well groomed, good eye contact.  ? ?Diabetic  Foot Exam - Simple   ?Simple Foot Form ?Diabetic Foot exam was performed with the following findings: Yes 06/07/2021  4:11 PM  ?Visual Inspection ?See comments: Yes ?Sensation Testing ?Intact to touch and monofilament testing bilaterally: Yes ?Pulse Check ?Posterior Tibialis and Dorsalis pulse intact bilaterally: Yes ?Comments ?Dry skin, bilateral. ?  ? ?ASSESSMENT AND PLAN: ? ?Mr.Jamill was seen today for medication refill and follow-up. ? ?Diagnoses and all orders for this visit: ? ?Orders Placed This Encounter  ?Procedures  ? Comprehensive metabolic panel  ? Hemoglobin A1c  ? Lipid panel  ? Microalbumin / creatinine urine ratio  ? Microalbumin / creatinine urine ratio  ? ?Lab Results  ?Component Value Date  ? HGBA1C 6.3 06/07/2021  ? ?Lab Results  ?Component Value Date  ? CREATININE 0.86 06/07/2021  ? BUN 16 06/07/2021  ? NA 137 06/07/2021  ? K 4.4 06/07/2021  ? CL 104 06/07/2021  ? CO2 25 06/07/2021  ? ?Lab Results  ?Component Value Date  ? ALT 22 06/07/2021  ? AST 17 06/07/2021  ? ALKPHOS 63 06/07/2021  ? BILITOT 0.6 06/07/2021  ? ?Lab Results  ?Component Value Date  ? CHOL 137 06/07/2021  ? HDL 58.80 06/07/2021  ? LDLCALC 55 06/07/2021  ? TRIG 117.0 06/07/2021  ? CHOLHDL 2 06/07/2021  ? ?Lab Results  ?Component Value Date  ? MICROALBUR <0.7 06/08/2021  ?     ? ?DM (diabetes mellitus), type 2 with peripheral vascular complications (HCC) ?HgA1C has been at goal. ?Continue metformin 500 mg twice daily.  Treatment will be adjusted according to  hemoglobin A1c. ?Annual eye exam, periodic dental and foot care recommended. ?F/U in 5-6 months ? ? ?Morbid obesity (HCC) ?He gained about 21 pounds since his last visit in 01/2020. ?We discussed benefits of wt l

## 2021-06-07 NOTE — Assessment & Plan Note (Signed)
Continue atorvastatin 40 mg daily. ?Low-fat diet also recommended. ?Further recommendation will be given according to lipid panel result. ?

## 2021-06-07 NOTE — Assessment & Plan Note (Signed)
HgA1C has been at goal. ?Continue metformin 500 mg twice daily.  Treatment will be adjusted according to hemoglobin A1c. ?Annual eye exam, periodic dental and foot care recommended. ?F/U in 5-6 months ? ?

## 2021-06-07 NOTE — Assessment & Plan Note (Addendum)
He gained about 21 pounds since his last visit in 01/2020. ?We discussed benefits of wt loss. ?Consistency with healthy diet and physical activity recommended. ?

## 2021-06-07 NOTE — Patient Instructions (Addendum)
A few things to remember from today's visit: ? ?Hypertension, essential, benign - Plan: hydrochlorothiazide (HYDRODIURIL) 25 MG tablet ? ?DM (diabetes mellitus), type 2 with peripheral vascular complications (HCC), Chronic - Plan: Comprehensive metabolic panel, Hemoglobin A1c, Microalbumin / creatinine urine ratio ? ?Morbid obesity (HCC), Chronic ? ?Atherosclerosis of aorta (HCC) - Plan: Lipid panel ? ?Hyperlipidemia associated with type 2 diabetes mellitus (HCC) - Plan: Lipid panel ? ?If you need refills please call your pharmacy. ?Do not use My Chart to request refills or for acute issues that need immediate attention. ?  ?No changes today. ?Monitor blood pressure at home. ?Low salt diet. ? ?Please be sure medication list is accurate. ?If a new problem present, please set up appointment sooner than planned today. ?DASH Eating Plan ?DASH stands for Dietary Approaches to Stop Hypertension. The DASH eating plan is a healthy eating plan that has been shown to: ?Reduce high blood pressure (hypertension). ?Reduce your risk for type 2 diabetes, heart disease, and stroke. ?Help with weight loss. ?What are tips for following this plan? ?Reading food labels ?Check food labels for the amount of salt (sodium) per serving. Choose foods with less than 5 percent of the Daily Value of sodium. Generally, foods with less than 300 milligrams (mg) of sodium per serving fit into this eating plan. ?To find whole grains, look for the word "whole" as the first word in the ingredient list. ?Shopping ?Buy products labeled as "low-sodium" or "no salt added." ?Buy fresh foods. Avoid canned foods and pre-made or frozen meals. ?Cooking ?Avoid adding salt when cooking. Use salt-free seasonings or herbs instead of table salt or sea salt. Check with your health care provider or pharmacist before using salt substitutes. ?Do not fry foods. Cook foods using healthy methods such as baking, boiling, grilling, roasting, and broiling instead. ?Cook with  heart-healthy oils, such as olive, canola, avocado, soybean, or sunflower oil. ?Meal planning ? ?Eat a balanced diet that includes: ?4 or more servings of fruits and 4 or more servings of vegetables each day. Try to fill one-half of your plate with fruits and vegetables. ?6-8 servings of whole grains each day. ?Less than 6 oz (170 g) of lean meat, poultry, or fish each day. A 3-oz (85-g) serving of meat is about the same size as a deck of cards. One egg equals 1 oz (28 g). ?2-3 servings of low-fat dairy each day. One serving is 1 cup (237 mL). ?1 serving of nuts, seeds, or beans 5 times each week. ?2-3 servings of heart-healthy fats. Healthy fats called omega-3 fatty acids are found in foods such as walnuts, flaxseeds, fortified milks, and eggs. These fats are also found in cold-water fish, such as sardines, salmon, and mackerel. ?Limit how much you eat of: ?Canned or prepackaged foods. ?Food that is high in trans fat, such as some fried foods. ?Food that is high in saturated fat, such as fatty meat. ?Desserts and other sweets, sugary drinks, and other foods with added sugar. ?Full-fat dairy products. ?Do not salt foods before eating. ?Do not eat more than 4 egg yolks a week. ?Try to eat at least 2 vegetarian meals a week. ?Eat more home-cooked food and less restaurant, buffet, and fast food. ?Lifestyle ?When eating at a restaurant, ask that your food be prepared with less salt or no salt, if possible. ?If you drink alcohol: ?Limit how much you use to: ?0-1 drink a day for women who are not pregnant. ?0-2 drinks a day for men. ?Be aware of  how much alcohol is in your drink. In the [place] one drink equals one 12 oz bottle of beer (355 mL), one 5 oz glass of wine (148 mL), or one 1? oz glass of hard liquor (44 mL). ?General information ?Avoid eating more than 2,300 mg of salt a day. If you have hypertension, you may need to reduce your sodium intake to 1,500 mg a day. ?Work with your health care provider to maintain a  healthy body weight or to lose weight. Ask what an ideal weight is for you. ?Get at least 30 minutes of exercise that causes your heart to beat faster (aerobic exercise) most days of the week. Activities may include walking, swimming, or biking. ?Work with your health care provider or dietitian to adjust your eating plan to your individual calorie needs. ?What foods should I eat? ?Fruits ?All fresh, dried, or frozen fruit. Canned fruit in natural juice (without added sugar). ?Vegetables ?Fresh or frozen vegetables (raw, steamed, roasted, or grilled). Low-sodium or reduced-sodium tomato and vegetable juice. Low-sodium or reduced-sodium tomato sauce and tomato paste. Low-sodium or reduced-sodium canned vegetables. ?Grains ?Whole-grain or whole-wheat bread. Whole-grain or whole-wheat pasta. Brown rice. Orpah Cobb. Bulgur. Whole-grain and low-sodium cereals. Pita bread. Low-fat, low-sodium crackers. Whole-wheat flour tortillas. ?Meats and other proteins ?Skinless chicken or [place]. Ground chicken or [place]. Pork with fat trimmed off. Fish and seafood. Egg whites. Dried beans, peas, or lentils. Unsalted nuts, nut butters, and seeds. Unsalted canned beans. Lean cuts of beef with fat trimmed off. Low-sodium, lean precooked or cured meat, such as sausages or meat loaves. ?Dairy ?Low-fat (1%) or fat-free (skim) milk. Reduced-fat, low-fat, or fat-free cheeses. Nonfat, low-sodium ricotta or cottage cheese. Low-fat or nonfat yogurt. Low-fat, low-sodium cheese. ?Fats and oils ?Soft margarine without trans fats. Vegetable oil. Reduced-fat, low-fat, or light mayonnaise and salad dressings (reduced-sodium). Canola, safflower, olive, avocado, soybean, and sunflower oils. Avocado. ?Seasonings and condiments ?Herbs. Spices. Seasoning mixes without salt. ?Other foods ?Unsalted popcorn and pretzels. Fat-free sweets. ?The items listed above may not be a complete list of foods and beverages you can eat. Contact a dietitian for more  information. ?What foods should I avoid? ?Fruits ?Canned fruit in a light or heavy syrup. Fried fruit. Fruit in cream or butter sauce. ?Vegetables ?Creamed or fried vegetables. Vegetables in a cheese sauce. Regular canned vegetables (not low-sodium or reduced-sodium). Regular canned tomato sauce and paste (not low-sodium or reduced-sodium). Regular tomato and vegetable juice (not low-sodium or reduced-sodium). Rosita Fire. Olives. ?Grains ?Baked goods made with fat, such as croissants, muffins, or some breads. Dry pasta or rice meal packs. ?Meats and other proteins ?Fatty cuts of meat. Ribs. Fried meat. Tomasa Blase. Bologna, salami, and other precooked or cured meats, such as sausages or meat loaves. Fat from the back of a pig (fatback). Bratwurst. Salted nuts and seeds. Canned beans with added salt. Canned or smoked fish. Whole eggs or egg yolks. Chicken or [place] with skin. ?Dairy ?Whole or 2% milk, cream, and half-and-half. Whole or full-fat cream cheese. Whole-fat or sweetened yogurt. Full-fat cheese. Nondairy creamers. Whipped toppings. Processed cheese and cheese spreads. ?Fats and oils ?Butter. Stick margarine. Lard. Shortening. Ghee. Bacon fat. Tropical oils, such as coconut, palm kernel, or palm oil. ?Seasonings and condiments ?Onion salt, garlic salt, seasoned salt, table salt, and sea salt. Worcestershire sauce. Tartar sauce. Barbecue sauce. Teriyaki sauce. Soy sauce, including reduced-sodium. Steak sauce. Canned and packaged gravies. Fish sauce. Oyster sauce. Cocktail sauce. Store-bought horseradish. Ketchup. Mustard. Meat flavorings and tenderizers. Bouillon cubes. Hot  sauces. Pre-made or packaged marinades. Pre-made or packaged taco seasonings. Relishes. Regular salad dressings. ?Other foods ?Salted popcorn and pretzels. ?The items listed above may not be a complete list of foods and beverages you should avoid. Contact a dietitian for more information. ?Where to find more information ?National Heart, Lung, and  Blood Institute: PopSteam.is ?American Heart Association: www.heart.org ?Academy of Nutrition and Dietetics: www.eatright.org ?National Kidney Foundation: www.kidney.org ?Summary ?The DASH eating

## 2021-06-07 NOTE — Assessment & Plan Note (Signed)
BP adequately controlled. ?Continue current management: Hydralazine 25 mg 3 times daily, amlodipine 10 mg daily, and HCTZ 25 mg daily.DASH/low salt diet recommended. ?Monitor BP at home. ?Eye exam is overdue. ?

## 2021-06-08 LAB — MICROALBUMIN / CREATININE URINE RATIO
Creatinine,U: 62.9 mg/dL
Microalb Creat Ratio: 1.1 mg/g (ref 0.0–30.0)
Microalb, Ur: <0.7 mg/dL (ref 0.0–1.9)

## 2021-06-08 LAB — COMPREHENSIVE METABOLIC PANEL
ALT: 22 U/L (ref 0–53)
AST: 17 U/L (ref 0–37)
Albumin: 4.4 g/dL (ref 3.5–5.2)
Alkaline Phosphatase: 63 U/L (ref 39–117)
BUN: 16 mg/dL (ref 6–23)
CO2: 25 mEq/L (ref 19–32)
Calcium: 9.4 mg/dL (ref 8.4–10.5)
Chloride: 104 mEq/L (ref 96–112)
Creatinine, Ser: 0.86 mg/dL (ref 0.40–1.50)
GFR: 92.06 mL/min (ref >60.00)
Glucose, Bld: 102 mg/dL — ABNORMAL HIGH (ref 70–99)
Potassium: 4.4 mEq/L (ref 3.5–5.1)
Sodium: 137 mEq/L (ref 135–145)
Total Bilirubin: 0.6 mg/dL (ref 0.2–1.2)
Total Protein: 6.7 g/dL (ref 6.0–8.3)

## 2021-06-08 LAB — LIPID PANEL
Cholesterol: 137 mg/dL (ref 0–200)
HDL: 58.8 mg/dL (ref >39.00)
LDL Cholesterol: 55 mg/dL (ref 0–99)
NonHDL: 78.43
Total CHOL/HDL Ratio: 2
Triglycerides: 117 mg/dL (ref 0.0–149.0)
VLDL: 23.4 mg/dL (ref 0.0–40.0)

## 2021-06-08 LAB — HEMOGLOBIN A1C: Hgb A1c MFr Bld: 6.3 % (ref 4.6–6.5)

## 2021-06-10 MED ORDER — AMLODIPINE BESYLATE 10 MG PO TABS: 10.0000 mg | ORAL_TABLET | Freq: Every day | ORAL | 1 refills | Status: DC

## 2021-06-10 MED ORDER — METFORMIN HCL 500 MG PO TABS: 500.0000 mg | ORAL_TABLET | Freq: Two times a day (BID) | ORAL | 1 refills | Status: DC

## 2021-06-10 MED ORDER — ATORVASTATIN CALCIUM 40 MG PO TABS: 40.0000 mg | ORAL_TABLET | Freq: Every day | ORAL | 2 refills | Status: DC

## 2021-07-07 ENCOUNTER — Telehealth: Payer: Self-pay | Admitting: Orthopaedic Surgery

## 2021-07-07 NOTE — Telephone Encounter (Signed)
Called patient left message to return call to schedule an appointment with Roda Shutters for right total knee. ?

## 2021-07-07 NOTE — Telephone Encounter (Signed)
Please advise 

## 2021-07-07 NOTE — Telephone Encounter (Signed)
Pt's girlfriend called to set a consultation for right total knee. She states pt's medical insurance states pt need to see dr. Magnus Ivan and pt is a pt of Dr Roda Shutters. Please call pt or send message pt can see Dr. Magnus Ivan for consultation for total right knee replacement. Phone number is [phone].  ?

## 2021-07-07 NOTE — Telephone Encounter (Signed)
I'm not aware of any insurance that requires him to see [facility].  He's an established patient of mine.  She should follow up with me.

## 2021-07-07 NOTE — Telephone Encounter (Signed)
Please call and schedule with Dr. Roda Shutters ?

## 2021-07-12 ENCOUNTER — Other Ambulatory Visit: Payer: Self-pay | Admitting: Family Medicine

## 2021-07-12 DIAGNOSIS — E1169 Type 2 diabetes mellitus with other specified complication: Secondary | ICD-10-CM

## 2021-08-02 ENCOUNTER — Ambulatory Visit: Payer: BC Managed Care – PPO | Admitting: Orthopaedic Surgery

## 2021-08-02 ENCOUNTER — Ambulatory Visit (INDEPENDENT_AMBULATORY_CARE_PROVIDER_SITE_OTHER): Payer: BC Managed Care – PPO

## 2021-08-02 VITALS — Ht 69.0 in | Wt 291.0 lb

## 2021-08-02 DIAGNOSIS — G8929 Other chronic pain: Secondary | ICD-10-CM

## 2021-08-02 DIAGNOSIS — M1711 Unilateral primary osteoarthritis, right knee: Secondary | ICD-10-CM

## 2021-08-02 DIAGNOSIS — M25561 Pain in right knee: Secondary | ICD-10-CM

## 2021-08-02 NOTE — Progress Notes (Signed)
Office Visit Note   Patient: Benjamin Rosales.           Date of Birth: 09-Nov-1955           MRN: [id number] Visit Date: 08/02/2021              Requested by: [place], Betty G, MD [id number] [place],  [place] [address] PCP: [place], Betty G, MD   Assessment & Plan: Visit Diagnoses:  1. Chronic pain of right knee   2. Unilateral primary osteoarthritis, right knee     Plan: I agree that at this point with the failure of conservative treatment combined with his x-ray findings and clinical exam findings that knee replacement is warranted for his right knee.  I have still advocated weight loss for him as well.  Most of his weight is not in his knees but more in his abdomen.  Weight loss will certainly help and continue quad strengthening exercises while he is considered for a right knee replacement.  Follow-Up Instructions: No follow-ups on file.   Orders:  Orders Placed This Encounter  Procedures   XR Knee 1-2 Views Right   No orders of the defined types were placed in this encounter.     Procedures: No procedures performed   Clinical Data: No additional findings.   Subjective: Chief Complaint  Patient presents with   Right Knee - Pain  The patient is actually been seen in our practice before.  He has well-documented severe arthritis of his right knee.  With his insurance plan, he can get full coverage for his knee replacement going to [facility].  He does have daily right knee pain.  He has seen one of my partners before as well.  He has had multiple steroid injections in his right knee and multiple hyaluronic acid injections in the right knee.  He is actually had these in his left knee before as well.  At this point his right knee pain is daily and it is 10 out of 10.  It is detrimentally affecting his mobility, his quality of life and his activities day living.  He is a diabetic but his last hemoglobin A1c was only 6.3 a month ago.  His BMI is  42.97.  He has dealt with knee pain for several years now and has tried and failed all forms of conservative treatment for over 12 months.  He is in need of knee replacement surgery based on his clinical exam and signs and symptoms as well as x-ray findings.  HPI  Review of Systems There is currently listed no headache, chest pain, shortness of breath, fever, chills, nausea, vomiting  Objective: Vital Signs: Ht 5\' 9"  (1.753 m)   Wt 291 lb (132 kg)   BMI 42.97 kg/m   Physical Exam He is alert and orient x3 and in no acute distress Ortho Exam Examination of both knees shows varus malalignment that is easily correctable.  Both knees have good range of motion but significant medial joint line tenderness and significant patellofemoral crepitation.  Both knees are ligamentously stable. Specialty Comments:  No specialty comments available.  Imaging: XR Knee 1-2 Views Right  Result Date: 08/02/2021 2 views of the right knee show severe end-stage arthritis.  There is valgus malalignment and almost complete loss of the medial joint space.  There is significant narrowing of the patellofemoral joint.  There are large osteophytes in all 3 compartments.    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   Past Medical History:  Diagnosis Date   Alcohol abuse    Decreased pedal pulses    Right foot   Diabetes mellitus without complication (HCC)    Hypercholesteremia    Hypertension    Obesity    Right inguinal hernia    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   Umbilical hernia     Family History  Problem Relation Age of Onset   High  blood pressure Father    Heart disease Father    High blood pressure Brother    Diabetes Brother     Past Surgical History:  Procedure Laterality Date   INGUINAL HERNIA REPAIR N/A 08/29/2019   Procedure: RIGHT INGUINAL HERNIA REPAIR WITH MESH;  Surgeon: Abigail Miyamoto, MD;  Location: [facility];  Service: General;  Laterality: N/A;   TONSILLECTOMY     UMBILICAL HERNIA REPAIR N/A 08/29/2019   Procedure: UMBILICAL HERNIA REPAIR WITH MESH;  Surgeon: Abigail Miyamoto, MD;  Location: [facility];  Service: General;  Laterality: N/A;   Social History   Occupational History    Comment: NA  Tobacco Use   Smoking status: Never   Smokeless tobacco: Never  Vaping Use   Vaping Use: Never used  Substance and Sexual Activity   Alcohol use: Not Currently    Alcohol/week: 28.0 standard drinks    Types: 28 Shots of liquor per week    Comment: 1 pint of liquor per day, 05/20/19 none   Drug use: Never   Sexual activity: Not on file

## 2021-08-20 NOTE — Progress Notes (Signed)
HPI: [name] is a 66 y.o. male with history of DM 2, CVA, hypertension, hyperlipidemia, and aortic atherosclerosis here today with her girlfriend to discuss upcoming surgery. He has been having right knee pain for years, getting worse.  He is planning on total right knee replacement, waiting for approval. His employer, Benjamin Rosales has a contract with [employer],which will cover TKR with no out of packet cost for him There is a process, he completed forms and mailed application. He is not sure if he is going to be approved.  According to pt, he does not need a pre op clearance or labs done before TKR. States that he may need referral to PT, usually PT after surgery is done through video.  He has not been exercising regularly die to knee OA. Trying to follow dietary recommendations. On Metformin 500 mg bid for DM II. Lab Results  Component Value Date   HGBA1C 6.3 06/07/2021   HTN on HCTZ 25 mg daily, hydralazine 25 mg 3 times daily, and amlodipine 10 mg daily. He does not checking BP at home. Hyperlipidemia, atorvastatin 40 mg daily.  Review of Systems  Constitutional:  Negative for activity change, appetite change and fever.  Respiratory:  Negative for cough, shortness of breath and wheezing.   Cardiovascular:  Negative for chest pain and palpitations.  Gastrointestinal:  Negative for abdominal pain, nausea and vomiting.  Genitourinary:  Negative for decreased urine volume, dysuria and hematuria.  Musculoskeletal:  Positive for arthralgias.  Neurological:  Negative for syncope, weakness and headaches.  Rest see pertinent positives and negatives per HPI.  Current Outpatient Medications on File Prior to Visit  Medication Sig Dispense Refill   amLODipine (NORVASC) 10 MG tablet Take 1 tablet (10 mg total) by mouth daily. Please call [phone] to schedule an appointment for further refills. 90 tablet 1   aspirin 81 MG chewable tablet Chew 1 tablet (81 mg total)  by mouth daily. 30 tablet 11   atorvastatin (LIPITOR) 40 MG tablet TAKE 1 TABLET(40 MG) BY MOUTH DAILY 90 tablet 2   Cyanocobalamin (VITAMIN B12) 1000 MCG TBCR Take 1,000 mcg by mouth daily. 90 tablet 1   folic acid (FOLVITE) 1 MG tablet TAKE 1 TABLET BY MOUTH EVERY DAY (Patient taking differently: Take 1 mg by mouth daily.) 90 tablet 2   glucose blood (ONETOUCH VERIO) test strip Use to test 1-2 times daily. 200 each 12   hydrALAZINE (APRESOLINE) 25 MG tablet TAKE 1 TABLET(25 MG) BY MOUTH THREE TIMES DAILY 270 tablet 2   hydrochlorothiazide (HYDRODIURIL) 25 MG tablet TAKE 1 TABLET(25 MG) BY MOUTH DAILY 90 tablet 1   metFORMIN (GLUCOPHAGE) 500 MG tablet Take 1 tablet (500 mg total) by mouth 2 (two) times daily with a meal. 180 tablet 1   Multiple Vitamin (MULTIVITAMIN) tablet Take 1 tablet by mouth daily.     thiamine 100 MG tablet Take 1 tablet (100 mg total) by mouth daily. 90 tablet 1   No current facility-administered medications on file prior to visit.    Past Medical History:  Diagnosis Date   Alcohol abuse    Decreased pedal pulses    Right foot   Diabetes mellitus without complication (HCC)    Hypercholesteremia    Hypertension    Obesity    Right inguinal hernia    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   Umbilical hernia    No Known Allergies  Social History   Socioeconomic History  Marital status: Single    Spouse name: Not on file   Number of children: 0   Years of education: 71   Highest education level: Not on file  Occupational History    Comment: NA  Tobacco Use   Smoking status: Never   Smokeless tobacco: Never  Vaping Use   Vaping Use: Never used  Substance and Sexual Activity   Alcohol use: Not Currently    Alcohol/week: 28.0 standard drinks of alcohol    Types: 28 Shots of liquor per week    Comment: 1 pint of liquor per day, 05/20/19 none   Drug use: Never   Sexual activity: Not on file  Other Topics Concern   Not on file   Social History Narrative   Lives alone   Caffeine- none   Social Determinants of Health   Financial Resource Strain: Not on file  Food Insecurity: Not on file  Transportation Needs: Not on file  Physical Activity: Not on file  Stress: Not on file  Social Connections: Not on file   Vitals:   08/23/21 1056  BP: 124/60  Pulse: 72  Resp: 16  SpO2: 97%   Wt Readings from Last 3 Encounters:  08/23/21 289 lb (131.1 kg)  08/02/21 291 lb (132 kg)  06/07/21 291 lb 6 oz (132.2 kg)   Body mass index is 42.68 kg/m.  Physical Exam Vitals and nursing note reviewed.  Constitutional:      General: He is not in acute distress.    Appearance: He is well-developed.  HENT:     Head: Atraumatic.  Eyes:     Conjunctiva/sclera: Conjunctivae normal.  Pulmonary:     Effort: Pulmonary effort is normal. No respiratory distress.     Breath sounds: Normal breath sounds.  Skin:    General: Skin is warm.     Findings: No erythema or rash.  Neurological:     General: No focal deficit present.     Mental Status: He is oriented to person, place, and time.     Comments: Antalgic gait, not assisted.  Psychiatric:     Comments: Well groomed, good eye contact.   ASSESSMENT AND PLAN:  [name] was seen today for follow-up.  Diagnoses and all orders for this visit:  Osteoarthritis of right knee, unspecified osteoarthritis type He is waiting for surgical approval to have surgery through employer contractor with [employer]. According to patient, he will not need a surgical clearance or labs. He is not sure about general requirements, he just sent application. He thinks he may need PT referral, which I will be glad to arrange as far as orthopedist/surgeon specified type of exercise needed after surgery. Recommend arranging a dental appointment, in case it is needed before surgery and to continue working on wt loss, may need BMI < 40.  Morbid obesity (HCC) He understands the benefits of wt  loss as well as adverse effects of obesity. Consistency with healthy diet and low impact physical activity as tolerated.  Corrected Dx, osteomyelitis was written in error.  Return if symptoms worsen or fail to improve, for Keep next appt..  Benjamin Baley G. [place], MD  [facility]. [place] office.

## 2021-08-23 ENCOUNTER — Ambulatory Visit: Payer: BC Managed Care – PPO | Admitting: Family Medicine

## 2021-08-23 ENCOUNTER — Encounter: Payer: Self-pay | Admitting: Family Medicine

## 2021-08-23 VITALS — BP 124/60 | HR 72 | Resp 16 | Ht 69.0 in | Wt 289.0 lb

## 2021-08-23 DIAGNOSIS — M869 Osteomyelitis, unspecified: Secondary | ICD-10-CM

## 2021-08-23 DIAGNOSIS — M1711 Unilateral primary osteoarthritis, right knee: Secondary | ICD-10-CM | POA: Diagnosis not present

## 2021-08-23 NOTE — Patient Instructions (Addendum)
A few things to remember from today's visit:  Osteoarthritis of right knee, unspecified osteoarthritis type  Morbid obesity (HCC)  If you need refills please call your pharmacy. Do not use My Chart to request refills or for acute issues that need immediate attention.   Ideally BMI needs to be under 40. If PT needs to be arranged here , I will need specifics of recommended exercises.  Please be sure medication list is accurate. If a new problem present, please set up appointment sooner than planned today.

## 2021-10-06 ENCOUNTER — Other Ambulatory Visit: Payer: Self-pay | Admitting: Family Medicine

## 2021-10-06 DIAGNOSIS — I10 Essential (primary) hypertension: Secondary | ICD-10-CM

## 2021-10-29 IMAGING — CT CT ANGIO NECK
2 of 7 series · 8 of 33 positions shown · IV contrast (APPLIED)
Comparison: Noncontrast head CT performed earlier the same day
04/12/2019

CLINICAL DATA: Neuro deficit, acute, stroke suspected.

EXAM:
CT ANGIOGRAPHY HEAD AND NECK
TECHNIQUE: Multidetector CT imaging of the head and neck was performed using
the standard protocol during bolus administration of intravenous
contrast. Multiplanar CT image reconstructions and MIPs were
obtained to evaluate the vascular anatomy. Carotid stenosis
measurements (when applicable) are obtained utilizing NASCET
criteria, using the distal internal carotid diameter as the
denominator.
CONTRAST:  100mL OMNIPAQUE IOHEXOL 350 MG/ML SOLN

[Series 5: cta neck/head · axial · 0.45mm/px · z∈[-302,-180]mm · 2 of 185 slices shown]
[im 62/185  soft-tissue]
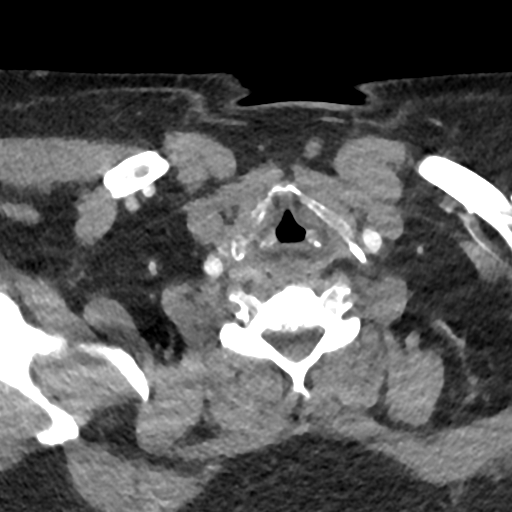
[im 123/185  soft-tissue]
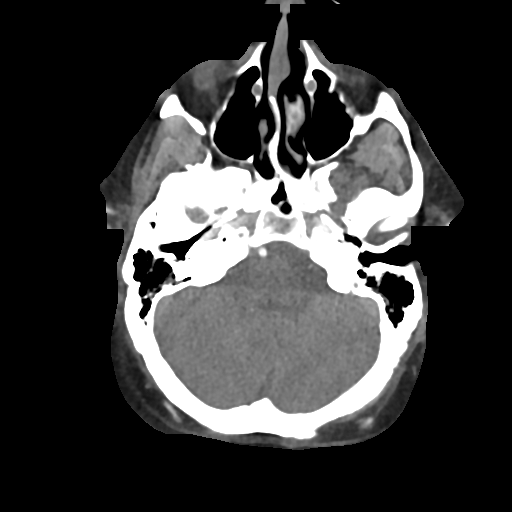

[Series 7: ax thins · axial · 0.39mm/px · z∈[-374,-110]mm · 6 of 371 slices shown]
[im 53/371  soft-tissue]
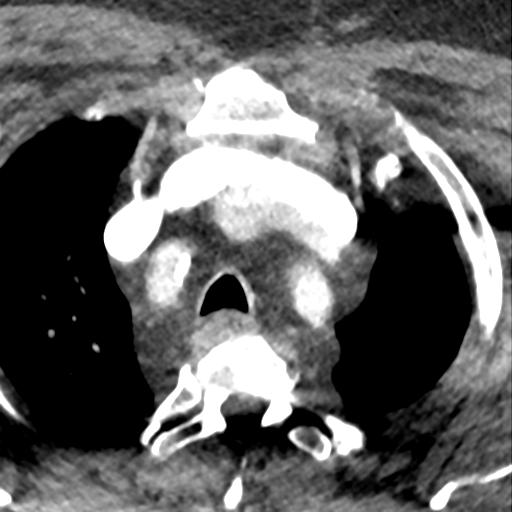
[im 106/371  bone]
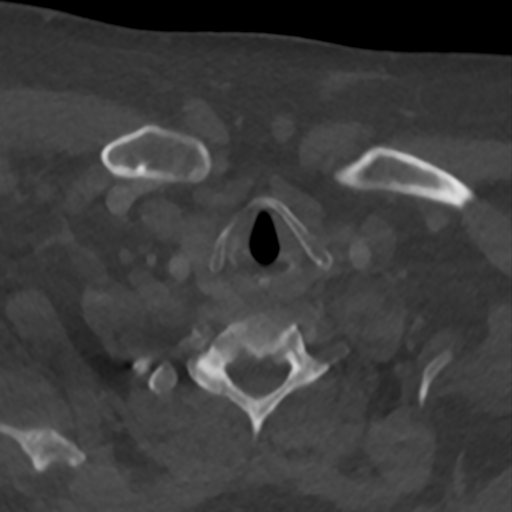
[im 159/371  soft-tissue]
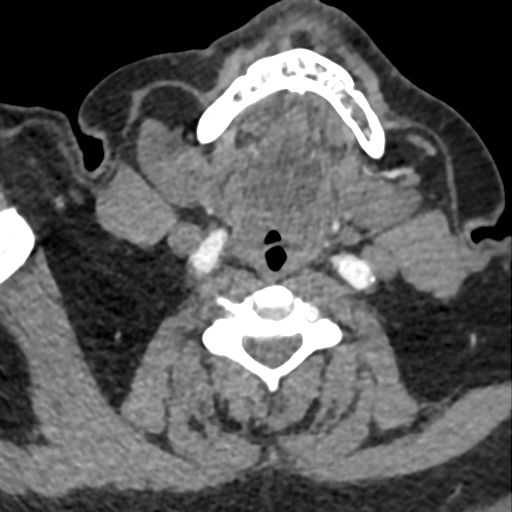
[im 212/371  bone]
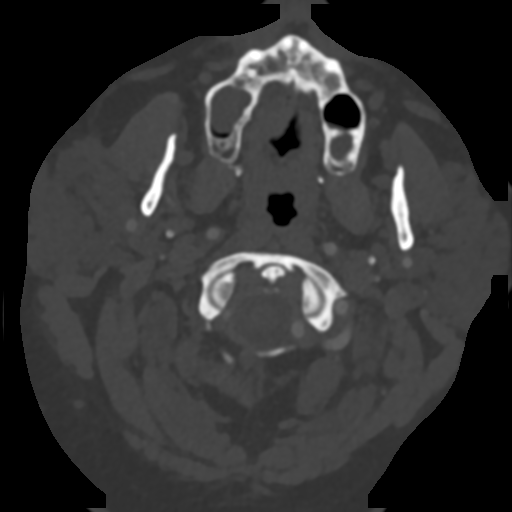
[im 265/371  soft-tissue]
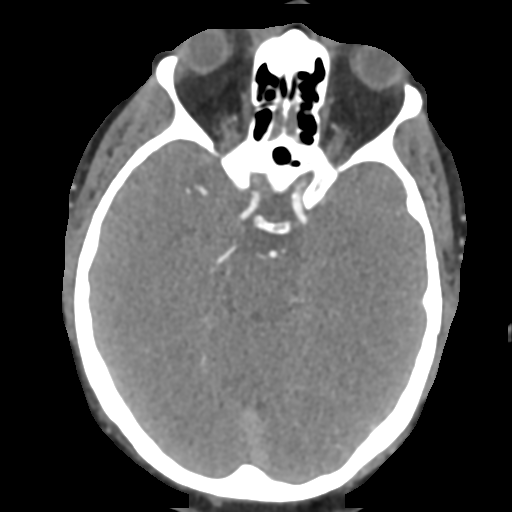
[im 318/371  bone]
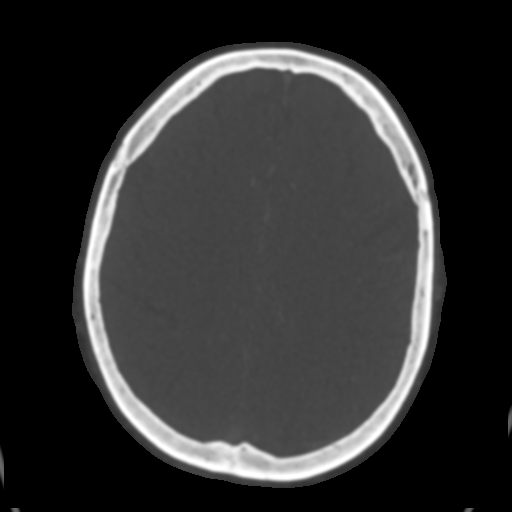

[8 of 33 positions shown; findings below may reference images not displayed]

FINDINGS: CTA NECK FINDINGS

Aortic arch: Common origin of the innominate and left common carotid
arteries. Mild calcified plaque within the visualized aortic arch
and proximal major branch vessels of the neck. No significant
innominate or proximal subclavian artery stenosis.

Right carotid system: The CCA and ICA are patent within the neck
without measurable stenosis. Mild predominantly soft plaque within
the proximal right ICA.

Left carotid system: CCA and ICA patent within the neck without
measurable stenosis. Mild mixed plaque within the distal CCA and
carotid bifurcation.

Vertebral arteries: The left vertebral artery is significantly
dominant. The right vertebral artery is markedly diminutive on a
developmental basis but appears patent throughout the neck. The left
vertebral artery is patent within the neck without measurable
stenosis.

Skeleton: No acute bony abnormality.

Other neck: No neck mass identified or cervical lymphadenopathy.

Upper chest: No consolidation within the imaged lung apices.

Review of the MIP images confirms the above findings

CTA HEAD FINDINGS

Evaluation is limited due to contrast bolus timing.

Anterior circulation:

The intracranial internal carotid arteries are patent bilaterally
with mild calcified plaque but no significant stenosis.

The M1 middle cerebral arteries are patent without significant
stenosis. Contrast bolus timing limits evaluation of the M2 and more
distal MCA branch vessels bilaterally. No M2 proximal branch
occlusion is identified. There are apparent multifocal stenoses
within the M2 and more distal right MCA branch vessels. This
includes sites of apparent moderate/severe stenosis within a
proximal M2 right MCA branch (series 12, image 17). No left M2
high-grade stenosis is identified.

The A1 right anterior cerebral artery is developmentally absent. The
A1 left anterior cerebral artery is patent without significant
stenosis and supplies the A2 right ACA. Sites of up to
moderate/severe stenosis within the proximal to mid A2 left anterior
cerebral artery (series 12, image 23). Sites of mild/moderate
stenosis within the A2 right anterior cerebral artery and more
distal bilateral anterior cerebral arteries.

No intracranial aneurysm is identified

Posterior circulation:

The non dominant right vertebral artery is diminutive but patent and
appears to terminate as the right PICA. The intracranial left
vertebral artery is patent. Calcified plaque within this vessel with
out significant stenosis. The left vertebral artery supplies the
basilar artery which is patent without significant stenosis. The
posterior cerebral arteries are patent. Sites of mild stenosis
within the P2 PCA is bilaterally. Sites of at least moderate
stenosis within the distal posterior cerebral arteries bilaterally.

Venous sinuses: As permitted by contrast timing, patent.

Anatomic variants: Posterior communicating arteries are poorly
delineated and may be hypoplastic or absent bilaterally.

Review of the MIP images confirms the above findings
IMPRESSION: CTA neck:

1. The bilateral common and internal carotid arteries are patent
within the neck without significant stenosis. Mild atherosclerosis
within the carotid systems as described.
2. The right vertebral artery is developmentally diminutive but
appears patent throughout the neck.
3. Dominant left vertebral artery patent within the neck without
stenosis.

CTA head:

1. Evaluation limited by contrast bolus timing.
2. No intracranial large vessel occlusion.
3. Intracranial atherosclerotic disease with multifocal stenoses
most notably as follows.
4. Apparent multifocal stenoses within proximal M2 and more distal
right MCA branch vessels. This includes sites of moderate/severe
stenosis within a proximal right M2 branch.
5. Sites of up to moderate/severe stenosis within the proximal to
mid A2 left anterior cerebral artery.
6. Sites of at least moderate stenosis within the distal posterior
cerebral arteries bilaterally.

## 2021-10-29 IMAGING — CR DG CHEST 2V
2 series · 2 of 2 positions shown · non-contrast
Comparison: None.

CLINICAL DATA: Hypertension, neuro deficit, stroke suspected

EXAM:
CHEST - 2 VIEW

[chest ap]
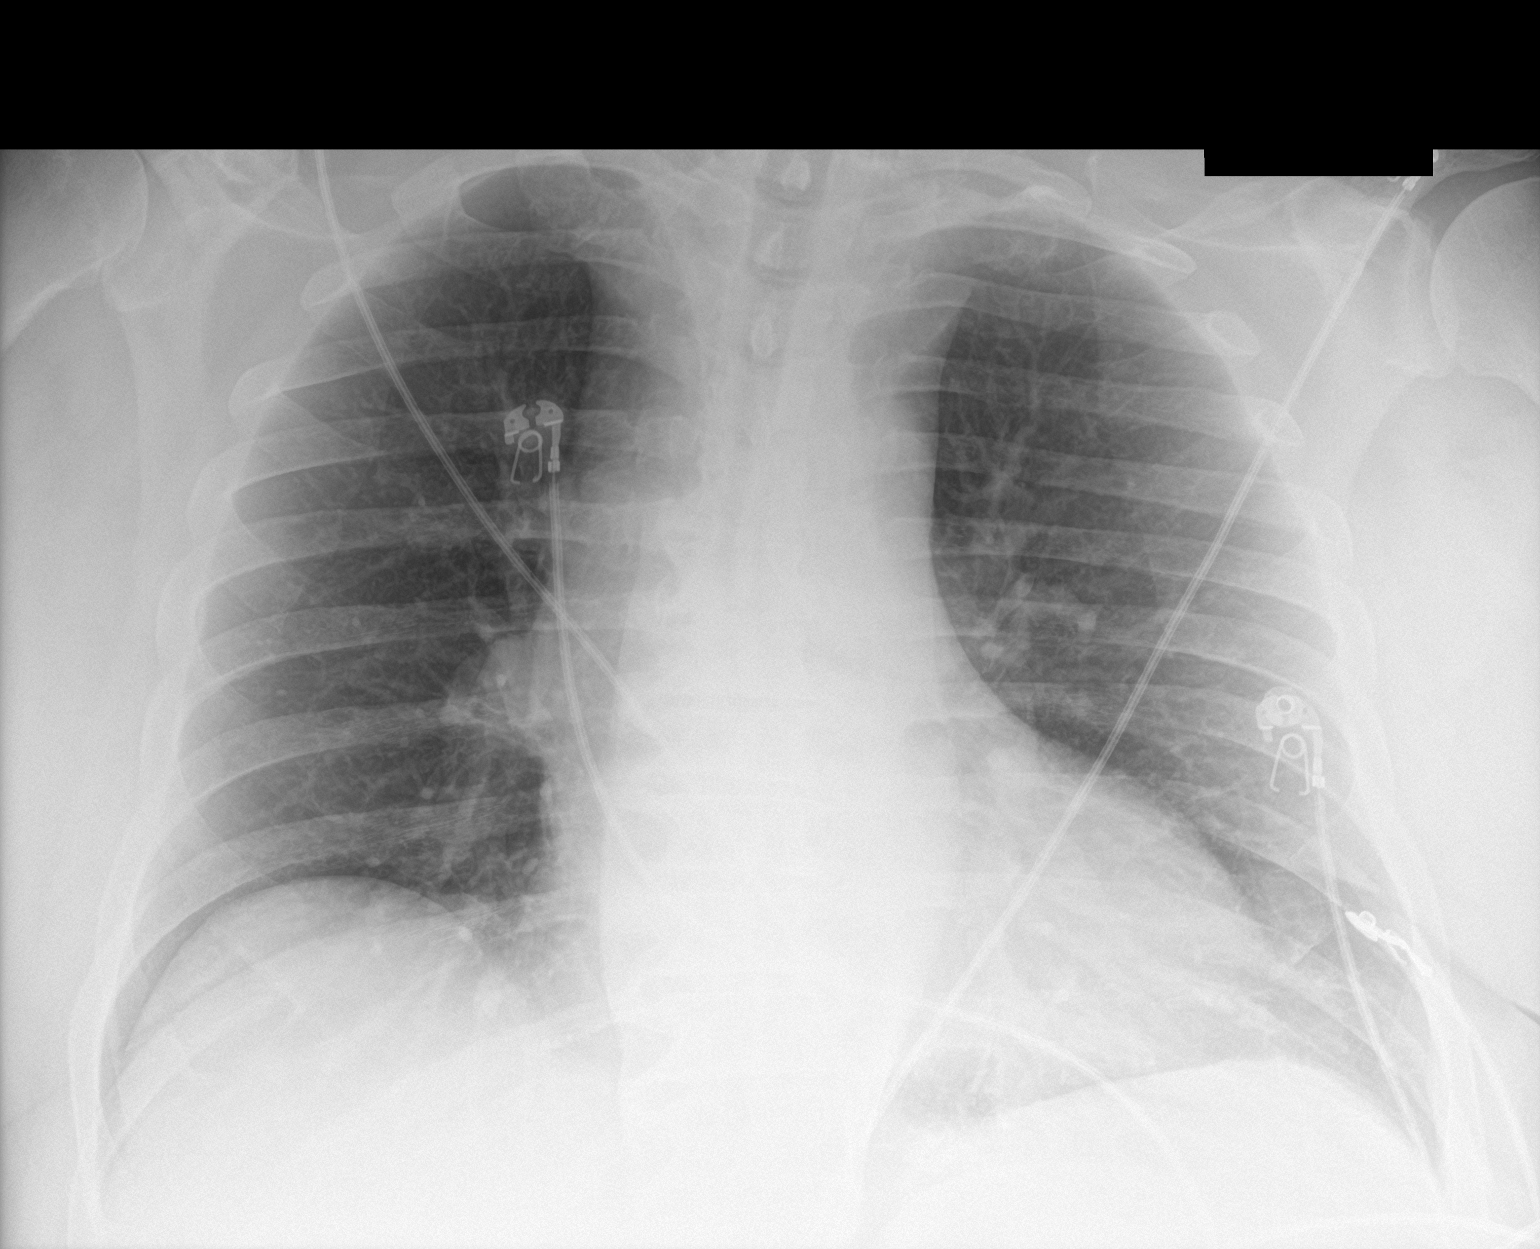

[chest lat]
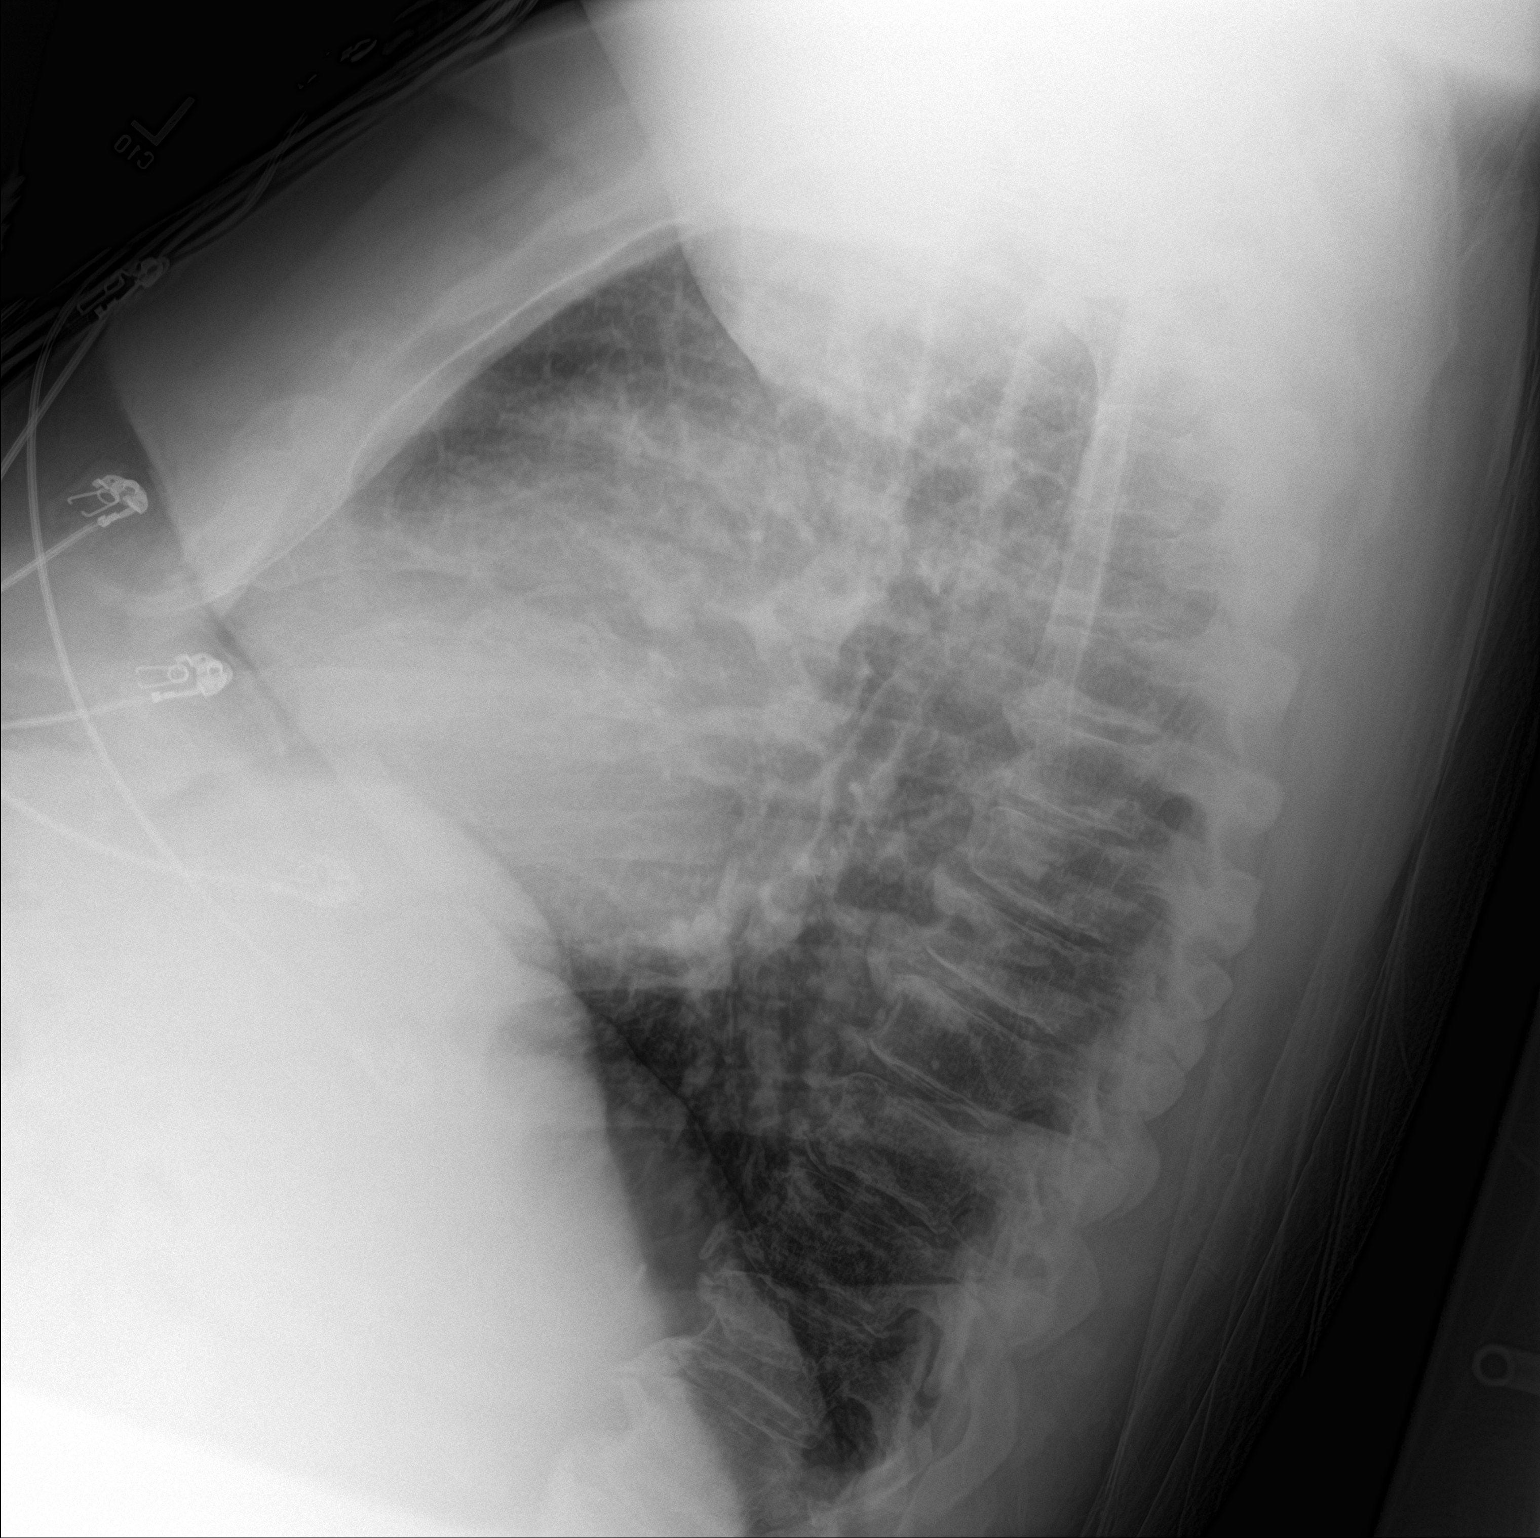

[2 of 2 positions shown; findings below may reference images not displayed]

FINDINGS: No consolidation, features of edema, pneumothorax, or effusion.
Pulmonary vascularity is normally distributed. The cardiomediastinal
contours are unremarkable. No acute osseous or soft tissue
abnormality. Degenerative changes are present in the imaged spine
and shoulders. Telemetry leads overlie the chest.
IMPRESSION: No acute cardiopulmonary findings.

## 2021-11-06 ENCOUNTER — Other Ambulatory Visit: Payer: Self-pay | Admitting: Family Medicine

## 2021-11-06 DIAGNOSIS — I10 Essential (primary) hypertension: Secondary | ICD-10-CM

## 2021-12-02 ENCOUNTER — Other Ambulatory Visit: Payer: Self-pay | Admitting: Family Medicine

## 2022-03-01 ENCOUNTER — Encounter: Payer: Self-pay | Admitting: Family Medicine

## 2022-03-01 ENCOUNTER — Ambulatory Visit (INDEPENDENT_AMBULATORY_CARE_PROVIDER_SITE_OTHER): Payer: BC Managed Care – PPO | Admitting: Family Medicine

## 2022-03-01 VITALS — Ht 69.0 in

## 2022-03-01 DIAGNOSIS — J101 Influenza due to other identified influenza virus with other respiratory manifestations: Secondary | ICD-10-CM | POA: Diagnosis not present

## 2022-03-01 DIAGNOSIS — R509 Fever, unspecified: Secondary | ICD-10-CM | POA: Diagnosis not present

## 2022-03-01 DIAGNOSIS — R059 Cough, unspecified: Secondary | ICD-10-CM | POA: Diagnosis not present

## 2022-03-01 LAB — POC INFLUENZA A&B (BINAX/QUICKVUE)
Influenza A, POC: POSITIVE — AB
Influenza B, POC: NEGATIVE

## 2022-03-01 LAB — POC COVID19 BINAXNOW: SARS Coronavirus 2 Ag: NEGATIVE

## 2022-03-01 MED ORDER — OSELTAMIVIR PHOSPHATE 75 MG PO CAPS: 75.0000 mg | ORAL_CAPSULE | Freq: Two times a day (BID) | ORAL | 0 refills | Status: AC

## 2022-03-01 NOTE — Progress Notes (Unsigned)
Virtual Visit via Video Note I connected with Benjamin Rosales. on 03/03/22 by a video enabled telemedicine application and verified that I am speaking with the correct person using two identifiers. Location patient: home Location provider:work office Persons participating in the virtual visit: patient, partner, provider  I discussed the limitations of evaluation and management by telemedicine and the availability of in person appointments. The patient expressed understanding and agreed to proceed.  Chief Complaint  Patient presents with   Influenza    Early yesterday    HPI: Benjamin Rosales is a 67 year old male with past medical history significant for DM 2, hypertension, hyperlipidemia, CVA, and BMI of 42 complaining of respiratory symptoms that started yesterday.  His symptoms include  fatigue, mild productive cough, fever, body aches, and headache.  Negative for hemoptysis. He denies experiencing nasal congestion, runny nose, or sore throat.  He has not experienced any wheezing or difficulty breathing. He has not taken any medications for his symptoms thus far.  He has not noted abdominal pain,N/V,changes in bowel habits,skin rash,or urinary symptoms. He reports that he began feeling sick yesterday and has tested positive for influenza A today here in the office. Negative for recent exposure to sick individuals or recent travel.  His blood sugars have been well-controlled , but he has not checked his blood pressure today.  ROS: See pertinent positives and negatives per HPI.  Past Medical History:  Diagnosis Date   Alcohol abuse    Decreased pedal pulses    Right foot   Diabetes mellitus without complication (HCC)    Hypercholesteremia    Hypertension    Obesity    Right inguinal hernia    Stroke ([place]) 04/2019   left pontine ischemic infarction   Umbilical hernia    Past Surgical History:  Procedure Laterality Date   INGUINAL HERNIA REPAIR N/A 08/29/2019    Procedure: RIGHT INGUINAL HERNIA REPAIR WITH MESH;  Surgeon: Coralie Keens, MD;  Location: [facility];  Service: General;  Laterality: N/A;   TONSILLECTOMY     UMBILICAL HERNIA REPAIR N/A 08/29/2019   Procedure: UMBILICAL HERNIA REPAIR WITH MESH;  Surgeon: Coralie Keens, MD;  Location: [facility];  Service: General;  Laterality: N/A;    Family History  Problem Relation Age of Onset   High blood pressure Father    Heart disease Father    High blood pressure Brother    Diabetes Brother     Social History   Socioeconomic History   Marital status: Single    Spouse name: Not on file   Number of children: 0   Years of education: 12   Highest education level: Not on file  Occupational History    Comment: NA  Tobacco Use   Smoking status: Never   Smokeless tobacco: Never  Vaping Use   Vaping Use: Never used  Substance and Sexual Activity   Alcohol use: Not Currently    Alcohol/week: 28.0 standard drinks of alcohol    Types: 28 Shots of liquor per week    Comment: 1 pint of liquor per day, 05/20/19 none   Drug use: Never   Sexual activity: Not on file  Other Topics Concern   Not on file  Social History Narrative   Lives alone   Caffeine- none   Social Determinants of Health   Financial Resource Strain: Not on file  Food Insecurity: Not on file  Transportation Needs: Not on file  Physical Activity: Not on file  Stress: Not on file  Social Connections: Not on file  Intimate Partner Violence: Not on file     Current Outpatient Medications:    amLODipine (NORVASC) 10 MG tablet, TAKE 1 TABLET(10 MG) BY MOUTH DAILY, Disp: 90 tablet, Rfl: 1   aspirin 81 MG chewable tablet, Chew 1 tablet (81 mg total) by mouth daily., Disp: 30 tablet, Rfl: 11   atorvastatin (LIPITOR) 40 MG tablet, TAKE 1 TABLET(40 MG) BY MOUTH DAILY, Disp: 90 tablet, Rfl: 2   Cyanocobalamin (VITAMIN B12) 1000 MCG TBCR, Take 1,000 mcg by mouth daily., Disp: 90 tablet, Rfl: 1   folic acid (FOLVITE) 1 MG tablet,  TAKE 1 TABLET BY MOUTH EVERY DAY (Patient taking differently: Take 1 mg by mouth daily.), Disp: 90 tablet, Rfl: 2   glucose blood (ONETOUCH VERIO) test strip, Use to test 1-2 times daily., Disp: 200 each, Rfl: 12   hydrALAZINE (APRESOLINE) 25 MG tablet, TAKE 1 TABLET(25 MG) BY MOUTH THREE TIMES DAILY, Disp: 270 tablet, Rfl: 2   hydrochlorothiazide (HYDRODIURIL) 25 MG tablet, TAKE 1 TABLET(25 MG) BY MOUTH DAILY, Disp: 90 tablet, Rfl: 1   metFORMIN (GLUCOPHAGE) 500 MG tablet, TAKE 1 TABLET(500 MG) BY MOUTH TWICE DAILY WITH A MEAL, Disp: 180 tablet, Rfl: 1   Multiple Vitamin (MULTIVITAMIN) tablet, Take 1 tablet by mouth daily., Disp: , Rfl:    oseltamivir (TAMIFLU) 75 MG capsule, Take 1 capsule (75 mg total) by mouth 2 (two) times daily for 5 days., Disp: 10 capsule, Rfl: 0   thiamine 100 MG tablet, Take 1 tablet (100 mg total) by mouth daily., Disp: 90 tablet, Rfl: 1  EXAM:  VITALS per patient if applicable:Ht 5\' 9"  (1.753 m)   BMI 42.68 kg/m    GENERAL: alert, oriented, appears well and in no acute distress  HEENT: atraumatic, conjunctiva clear, no obvious abnormalities on inspection of external nose and ears  NECK: normal movements of the head and neck  LUNGS: on inspection no signs of respiratory distress, breathing rate appears normal, no obvious gross SOB, gasping or wheezing  CV: no obvious cyanosis  MS: moves all visible extremities without noticeable abnormality  PSYCH/NEURO: pleasant and cooperative, no obvious depression or anxiety, speech and thought processing grossly intact  ASSESSMENT AND PLAN:  Discussed the following assessment and plan:   Fever, unspecified fever cause -     POC COVID-19 BinaxNow -     POC Influenza A&B(BINAX/QUICKVUE)  Influenza A -     Oseltamivir Phosphate; Take 1 capsule (75 mg total) by mouth 2 (two) times daily for 5 days.  Dispense: 10 capsule; Refill: 0  Educated about Dx and prognosis. Instructed to monitor for signs of  complications. Tylenol 500 mg 3-4 times per day, plenty of po fluids,and rest. Tamiflu 75 mg bid x 5 d to start asap. Clearly instructed about warning signs. I also explained that cough and nasal congestion can last a few days and sometimes weeks. F/U as needed.  We discussed possible serious and likely etiologies, options for evaluation and workup, limitations of telemedicine visit vs in person visit, treatment, treatment risks and precautions. The patient was advised to call back or seek an in-person evaluation if the symptoms worsen or if the condition fails to improve as anticipated. I discussed the assessment and treatment plan with the patient. The patient was provided an opportunity to ask questions and all were answered. The patient agreed with the plan and demonstrated an understanding of the instructions.  Return if symptoms worsen or fail to improve, for keep next  appointment. Marsha Hillman G. [place], MD  [facility]. [place] office.

## 2022-03-22 ENCOUNTER — Telehealth: Payer: Self-pay | Admitting: Family Medicine

## 2022-03-22 NOTE — Telephone Encounter (Signed)
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  Call [phone] upon completion.

## 2022-03-22 NOTE — Telephone Encounter (Signed)
Filled out & on pcp's desk for signature. 

## 2022-03-23 NOTE — Telephone Encounter (Signed)
Form signed. Copy sent to scan. I left patient a voicemail letting him know it is ready for pick up.

## 2022-03-28 NOTE — Telephone Encounter (Signed)
Pt's wife called to say she asked that the form be faxed.  Pt needs this form faxed to his employer ASAP, to the fax number on the top of the form.

## 2022-03-28 NOTE — Telephone Encounter (Signed)
Faxed with confirmation that is has been received.

## 2022-04-02 ENCOUNTER — Other Ambulatory Visit: Payer: Self-pay | Admitting: Family Medicine

## 2022-04-02 DIAGNOSIS — E1169 Type 2 diabetes mellitus with other specified complication: Secondary | ICD-10-CM

## 2022-06-02 ENCOUNTER — Other Ambulatory Visit: Payer: Self-pay | Admitting: Family Medicine

## 2022-06-02 DIAGNOSIS — I10 Essential (primary) hypertension: Secondary | ICD-10-CM

## 2022-07-03 ENCOUNTER — Other Ambulatory Visit: Payer: Self-pay | Admitting: Family Medicine

## 2022-07-03 DIAGNOSIS — I10 Essential (primary) hypertension: Secondary | ICD-10-CM

## 2022-08-31 ENCOUNTER — Other Ambulatory Visit: Payer: Self-pay | Admitting: Family Medicine

## 2022-08-31 DIAGNOSIS — I10 Essential (primary) hypertension: Secondary | ICD-10-CM

## 2022-09-19 DIAGNOSIS — E559 Vitamin D deficiency, unspecified: Secondary | ICD-10-CM | POA: Diagnosis not present

## 2022-09-19 DIAGNOSIS — E782 Mixed hyperlipidemia: Secondary | ICD-10-CM | POA: Diagnosis not present

## 2022-09-19 DIAGNOSIS — E1165 Type 2 diabetes mellitus with hyperglycemia: Secondary | ICD-10-CM | POA: Diagnosis not present

## 2022-09-19 DIAGNOSIS — I10 Essential (primary) hypertension: Secondary | ICD-10-CM | POA: Diagnosis not present

## 2022-09-26 ENCOUNTER — Ambulatory Visit: Payer: BC Managed Care – PPO | Admitting: Orthopaedic Surgery

## 2022-09-27 ENCOUNTER — Other Ambulatory Visit: Payer: Self-pay | Admitting: Family Medicine

## 2022-09-27 DIAGNOSIS — I10 Essential (primary) hypertension: Secondary | ICD-10-CM

## 2022-10-11 ENCOUNTER — Encounter: Payer: Self-pay | Admitting: Physician Assistant

## 2022-10-11 ENCOUNTER — Ambulatory Visit (HOSPITAL_COMMUNITY)
Admission: RE | Admit: 2022-10-11 | Discharge: 2022-10-11 | Disposition: A | Discharge status: Home / Self Care | Payer: BC Managed Care – PPO | Source: Ambulatory Visit | Attending: Cardiovascular Disease | Admitting: Cardiovascular Disease

## 2022-10-11 ENCOUNTER — Ambulatory Visit: Payer: BC Managed Care – PPO | Admitting: Physician Assistant

## 2022-10-11 VITALS — Ht 66.54 in | Wt 255.6 lb

## 2022-10-11 DIAGNOSIS — M1711 Unilateral primary osteoarthritis, right knee: Secondary | ICD-10-CM

## 2022-10-11 NOTE — Progress Notes (Signed)
Office Visit Note   Patient: Benjamin Rosales.           Date of Birth: 1955/10/14           MRN: [id number] Visit Date: 10/11/2022              Requested by: [place], Betty G, MD [address] [place],  [place] [address] PCP: [place], Betty G, MD   Assessment & Plan: Visit Diagnoses:  1. Unilateral primary osteoarthritis, right knee     Plan: Given patient's failure of conservative treatment of the right knee recommend right total knee arthroplasty.  He will continue to work on weight loss.  Risk benefits of surgery postop protocol discussed with the patient.  Risk include but are not limited to nerve vessel injury, DVT/PE, wound healing problems, prolonged pain, blood loss.  Did send him for Doppler today.  This is due to his family history with a brother having DVT and PE of unknown etiology.  He also continues to have aches right lower leg swelling and calf pain.  This was negative for DVT on the preliminary report.  Follow-Up Instructions: Return for post op.   Orders:  Orders Placed This Encounter  Procedures   VAS [place] LOWER EXTREMITY VENOUS (DVT)   No orders of the defined types were placed in this encounter.     Procedures: No procedures performed   Clinical Data: No additional findings.   Subjective: Chief Complaint  Patient presents with   Right Knee - Follow-up    HPI Benjamin Rosales comes in today to discuss his his right knee osteoarthritis.  He has severe arthritis of his right knee.  He is failed conservative treatment which is included steroid injections hyaluronic acid and weight loss.  He is actually lost some 30 pounds since we last saw him.  He has been working on changing his diet.  He notes that if he goes to a store he has to lean on a shopping cart to ambulate.  He is taking Aleve as needed for pain.  He is diabetic and his hemoglobin A1c is 5.3.  He denies any fevers chills ongoing infections chest pain shortness of breath.  Review of  Systems See HPI  Objective: Vital Signs: Ht 5' 6.54" (1.69 m)   Wt 255 lb 9.6 oz (115.9 kg)   BMI 40.59 kg/m   Physical Exam Constitutional:      Appearance: He is obese.  Pulmonary:     Effort: Pulmonary effort is normal.  Neurological:     Mental Status: He is alert and oriented to person, place, and time.  Psychiatric:        Behavior: Behavior normal.     Ortho Exam Bilateral knees: Good range of motion of both knees significant crepitus with passive range of motion bilaterally.  No instability valgus varus stressing.  No abnormal warmth erythema or effusion.  Varus malalignment bilaterally.  Specialty Comments:  No specialty comments available.  Imaging: VAS [place] LOWER EXTREMITY VENOUS (DVT)  Result Date: 10/11/2022  Lower Venous DVT Study Patient Name:  Benjamin Rosales.  Date of Exam:   10/11/2022 Medical Rec #: [id number]                 Accession #:    [id number] Date of Birth: Feb 21, 1956                 Patient Gender: M Patient Age:   67 years Exam Location:  [facility]  Procedure:      VAS [place] LOWER EXTREMITY VENOUS (DVT) Referring Phys: Richardean Canal --------------------------------------------------------------------------------  Indications: Patient had a stroke that affected the right side about 4 years ago which has affected his gait. He has right knee pain going into the calf. He does not remember having a traumatic injury to the right calf. He denies chest pain and SOB.  Comparison Study: None Performing Technologist: Alecia Mackin RVT, RDCS (AE), RDMS  Examination Guidelines: A complete evaluation includes B-mode imaging, spectral Doppler, color Doppler, and power Doppler as needed of all accessible portions of each vessel. Bilateral testing is considered an integral part of a complete examination. Limited examinations for reoccurring indications may be performed as noted. The reflux portion of the exam is performed with the patient in reverse Trendelenburg.   +---------+---------------+---------+-----------+----------+--------------+ RIGHT    CompressibilityPhasicitySpontaneityPropertiesThrombus Aging +---------+---------------+---------+-----------+----------+--------------+ CFV      Full           Yes      Yes                                 +---------+---------------+---------+-----------+----------+--------------+ SFJ      Full           Yes      Yes                                 +---------+---------------+---------+-----------+----------+--------------+ FV Prox  Full           Yes      Yes                                 +---------+---------------+---------+-----------+----------+--------------+ FV Mid   Full           Yes      Yes                                 +---------+---------------+---------+-----------+----------+--------------+ FV DistalFull           Yes      Yes                                 +---------+---------------+---------+-----------+----------+--------------+ PFV      Full                                                        +---------+---------------+---------+-----------+----------+--------------+ POP      Full           Yes      Yes                                 +---------+---------------+---------+-----------+----------+--------------+ PTV      Full           Yes      Yes                                 +---------+---------------+---------+-----------+----------+--------------+ PERO  Full           Yes      Yes                                 +---------+---------------+---------+-----------+----------+--------------+ Gastroc  Full                                                        +---------+---------------+---------+-----------+----------+--------------+ GSV      Full           Yes      Yes                                 +---------+---------------+---------+-----------+----------+--------------+    +----+---------------+---------+-----------+----------+--------------+ LEFTCompressibilityPhasicitySpontaneityPropertiesThrombus Aging +----+---------------+---------+-----------+----------+--------------+ CFV Full           Yes      Yes                                 +----+---------------+---------+-----------+----------+--------------+    Findings reported to [employer] office (left voicemail for triage nurse) at 3:45 pm.  Summary: RIGHT: - No evidence of deep vein thrombosis in the lower extremity. No indirect evidence of obstruction proximal to the inguinal ligament. - Complex avascular cystic area within muscle in the proximal posterior calf, most likely representing a hematoma. There is a complex appearing Baker's cyst measuring 6.1 x 1.1 x 3.7 cm. Mild to moderate superficial edema noted in the distal calf.  LEFT: - No evidence of common femoral vein obstruction.  *See table(s) above for measurements and observations.    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/12/2019   Past Medical History:  Diagnosis Date   Alcohol abuse    Decreased pedal pulses    Right foot   Diabetes mellitus without complication (HCC)    Hypercholesteremia    Hypertension    Obesity    Right inguinal hernia    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   Umbilical hernia     Family History  Problem Relation Age of Onset   High blood pressure Father    Heart disease Father    High blood pressure Brother    Diabetes Brother     Past Surgical History:  Procedure Laterality Date   INGUINAL HERNIA REPAIR N/A 08/29/2019    Procedure: RIGHT INGUINAL HERNIA REPAIR WITH MESH;  Surgeon: Abigail Miyamoto, MD;  Location: [facility];  Service: General;  Laterality: N/A;   TONSILLECTOMY     UMBILICAL HERNIA REPAIR N/A 08/29/2019   Procedure: UMBILICAL HERNIA REPAIR WITH MESH;  Surgeon: Abigail Miyamoto, MD;  Location: [facility];  Service: General;  Laterality: N/A;   Social History   Occupational History    Comment: NA  Tobacco Use   Smoking status: Never   Smokeless tobacco: Never  Vaping Use   Vaping status: Never Used  Substance and Sexual Activity   Alcohol use: Not Currently    Alcohol/week: 28.0 standard drinks of alcohol    Types: 28 Shots of liquor per week    Comment: 1 pint of liquor per day, 05/20/19 none   Drug use: Never   Sexual activity: Not on file

## 2022-10-14 ENCOUNTER — Telehealth: Payer: Self-pay | Admitting: Physician Assistant

## 2022-10-14 NOTE — Telephone Encounter (Signed)
Pt is unsure about where we are with scheduling surgery would like call.

## 2022-10-14 NOTE — Telephone Encounter (Signed)
Lvm advising pt he will be called to set up sx soon

## 2022-10-26 ENCOUNTER — Other Ambulatory Visit: Payer: Self-pay | Admitting: Family Medicine

## 2022-10-26 DIAGNOSIS — I10 Essential (primary) hypertension: Secondary | ICD-10-CM

## 2022-11-01 ENCOUNTER — Other Ambulatory Visit: Payer: Self-pay | Admitting: Family Medicine

## 2022-11-01 DIAGNOSIS — I10 Essential (primary) hypertension: Secondary | ICD-10-CM

## 2022-11-03 ENCOUNTER — Telehealth: Payer: Self-pay | Admitting: Physician Assistant

## 2022-11-03 NOTE — Telephone Encounter (Signed)
(  In person) Pt requested another phone call to set up RT knee surgery appointment.

## 2022-11-10 NOTE — Telephone Encounter (Signed)
I called and left voice mail for return call. 

## 2022-11-15 NOTE — Telephone Encounter (Signed)
Spoke with [facility] and scheduled surgery.

## 2022-12-02 ENCOUNTER — Telehealth: Payer: Self-pay | Admitting: Orthopaedic Surgery

## 2022-12-02 NOTE — Telephone Encounter (Signed)
Received $25 cash and auth for forms. Patient informed to reach out to [facility] to send [place] forms. Money and auth to [employer].

## 2022-12-05 NOTE — Patient Instructions (Signed)
DUE TO COVID-19 ONLY TWO VISITORS  (aged 65 and older)  ARE ALLOWED TO COME WITH YOU AND STAY IN THE WAITING ROOM ONLY DURING PRE OP AND PROCEDURE.   **NO VISITORS ARE ALLOWED IN THE SHORT STAY AREA OR RECOVERY ROOM!!**  IF YOU WILL BE ADMITTED INTO THE HOSPITAL YOU ARE ALLOWED ONLY FOUR SUPPORT PEOPLE DURING VISITATION HOURS ONLY (7 AM -8PM)   The support person(s) must pass our screening, gel in and out, and wear a mask at all times, including in the patient's room. Patients must also wear a mask when staff or their support person are in the room. Visitors GUEST BADGE MUST BE WORN VISIBLY  One adult visitor may remain with you overnight and MUST be in the room by 8 P.M.     Your procedure is scheduled on: 12/16/22   Report to [facility] Main Entrance    Report to admitting at : 7:30 AM   Call this number if you have problems the morning of surgery [phone]   Do not eat food :After Midnight.   After Midnight you may have the following liquids until : 7:00 AM DAY OF SURGERY  Water Black Coffee (sugar ok, NO MILK/CREAM OR CREAMERS)  Tea (sugar ok, NO MILK/CREAM OR CREAMERS) regular and decaf                             Plain Jell-O (NO RED)                                           Fruit ices (not with fruit pulp, NO RED)                                     Popsicles (NO RED)                                                                  Juice: apple, WHITE grape, WHITE cranberry Sports drinks like Gatorade (NO RED)   The day of surgery:  Drink ONE (1) Pre-Surgery Clear G2 at : 7:00 AM the morning of surgery. Drink in one sitting. Do not sip.  This drink was given to you during your hospital  pre-op appointment visit. Nothing else to drink after completing the  Pre-Surgery Clear Ensure or G2.          If you have questions, please contact your surgeon's office.  FOLLOW ANY ADDITIONAL PRE OP INSTRUCTIONS YOU RECEIVED FROM YOUR SURGEON'S OFFICE!!!   Oral Hygiene  is also important to reduce your risk of infection.                                    Remember - BRUSH YOUR TEETH THE MORNING OF SURGERY WITH YOUR REGULAR TOOTHPASTE  DENTURES WILL BE REMOVED PRIOR TO SURGERY PLEASE DO NOT APPLY "Poly grip" OR ADHESIVES!!!   Do NOT smoke after Midnight   Take these medicines the morning of surgery  with A SIP OF WATER: hydralazine,amlodipine.  DO NOT TAKE ANY ORAL DIABETIC MEDICATIONS DAY OF YOUR SURGERY                You may not have any metal on your body including hair pins, jewelry, and body piercing             Do not wear lotions, powders, perfumes/cologne, or deodorant              Men may shave face and neck.   Do not bring valuables to the hospital. [facility] IS NOT             RESPONSIBLE   FOR VALUABLES.   Contacts, glasses, or bridgework may not be worn into surgery.   Bring small overnight bag day of surgery.   DO NOT BRING YOUR HOME MEDICATIONS TO THE HOSPITAL. PHARMACY WILL DISPENSE MEDICATIONS LISTED ON YOUR MEDICATION LIST TO YOU DURING YOUR ADMISSION IN THE HOSPITAL!    Patients discharged on the day of surgery will not be allowed to drive home.  Someone NEEDS to stay with you for the first 24 hours after anesthesia.   Special Instructions: Bring a copy of your healthcare power of attorney and living will documents         the day of surgery if you haven't scanned them before.              Please read over the following fact sheets you were given: IF YOU HAVE QUESTIONS ABOUT YOUR PRE-OP INSTRUCTIONS PLEASE CALL [phone]      Pre-operative 5 CHG Bath Instructions   You can play a key role in reducing the risk of infection after surgery. Your skin needs to be as free of germs as possible. You can reduce the number of germs on your skin by washing with CHG (chlorhexidine gluconate) soap before surgery. CHG is an antiseptic soap that kills germs and continues to kill germs even after washing.   DO NOT use if you have an  allergy to chlorhexidine/CHG or antibacterial soaps. If your skin becomes reddened or irritated, stop using the CHG and notify one of our RNs at : [phone].   Please shower with the CHG soap starting 4 days before surgery using the following schedule:     Please keep in mind the following:  DO NOT shave, including legs and underarms, starting the day of your first shower.   You may shave your face at any point before/day of surgery.  Place clean sheets on your bed the day you start using CHG soap. Use a clean washcloth (not used since being washed) for each shower. DO NOT sleep with pets once you start using the CHG.   CHG Shower Instructions:  If you choose to wash your hair and private area, wash first with your normal shampoo/soap.  After you use shampoo/soap, rinse your hair and body thoroughly to remove shampoo/soap residue.  Turn the water OFF and apply about 3 tablespoons (45 ml) of CHG soap to a CLEAN washcloth.  Apply CHG soap ONLY FROM YOUR NECK DOWN TO YOUR TOES (washing for 3-5 minutes)  DO NOT use CHG soap on face, private areas, open wounds, or sores.  Pay special attention to the area where your surgery is being performed.  If you are having back surgery, having someone wash your back for you may be helpful. Wait 2 minutes after CHG soap is applied, then you may rinse off the CHG  soap.  Pat dry with a clean towel  Put on clean clothes/pajamas   If you choose to wear lotion, please use ONLY the CHG-compatible lotions on the back of this paper.     Additional instructions for the day of surgery: DO NOT APPLY any lotions, deodorants, cologne, or perfumes.   Put on clean/comfortable clothes.  Brush your teeth.  Ask your nurse before applying any prescription medications to the skin.    CHG Compatible Lotions   Aveeno Moisturizing lotion  Cetaphil Moisturizing Cream  Cetaphil Moisturizing Lotion  Clairol Herbal Essence Moisturizing Lotion, Dry Skin  Clairol  Herbal Essence Moisturizing Lotion, Extra Dry Skin  Clairol Herbal Essence Moisturizing Lotion, Normal Skin  Curel Age Defying Therapeutic Moisturizing Lotion with Alpha Hydroxy  Curel Extreme Care Body Lotion  Curel Soothing Hands Moisturizing Hand Lotion  Curel Therapeutic Moisturizing Cream, Fragrance-Free  Curel Therapeutic Moisturizing Lotion, Fragrance-Free  Curel Therapeutic Moisturizing Lotion, Original Formula  Eucerin Daily Replenishing Lotion  Eucerin Dry Skin Therapy Plus Alpha Hydroxy Crme  Eucerin Dry Skin Therapy Plus Alpha Hydroxy Lotion  Eucerin Original Crme  Eucerin Original Lotion  Eucerin Plus Crme Eucerin Plus Lotion  Eucerin TriLipid Replenishing Lotion  Keri Anti-Bacterial Hand Lotion  Keri Deep Conditioning Original Lotion Dry Skin Formula Softly Scented  Keri Deep Conditioning Original Lotion, Fragrance Free Sensitive Skin Formula  Keri Lotion Fast Absorbing Fragrance Free Sensitive Skin Formula  Keri Lotion Fast Absorbing Softly Scented Dry Skin Formula  Keri Original Lotion  Keri Skin Renewal Lotion Keri Silky Smooth Lotion  Keri Silky Smooth Sensitive Skin Lotion  Nivea Body Creamy Conditioning Oil  Nivea Body Extra Enriched Lotion  Nivea Body Original Lotion  Nivea Body Sheer Moisturizing Lotion Nivea Crme  Nivea Skin Firming Lotion  NutraDerm 30 Skin Lotion  NutraDerm Skin Lotion  NutraDerm Therapeutic Skin Cream  NutraDerm Therapeutic Skin Lotion  ProShield Protective Hand Cream  Provon moisturizing lotion   Incentive Spirometer  An incentive spirometer is a tool that can help keep your lungs clear and active. This tool measures how well you are filling your lungs with each breath. Taking long deep breaths may help reverse or decrease the chance of developing breathing (pulmonary) problems (especially infection) following: A long period of time when you are unable to move or be active. BEFORE THE PROCEDURE  If the spirometer includes an  indicator to show your best effort, your nurse or respiratory therapist will set it to a desired goal. If possible, sit up straight or lean slightly forward. Try not to slouch. Hold the incentive spirometer in an upright position. INSTRUCTIONS FOR USE  Sit on the edge of your bed if possible, or sit up as far as you can in bed or on a chair. Hold the incentive spirometer in an upright position. Breathe out normally. Place the mouthpiece in your mouth and seal your lips tightly around it. Breathe in slowly and as deeply as possible, raising the piston or the ball toward the top of the column. Hold your breath for 3-5 seconds or for as long as possible. Allow the piston or ball to fall to the bottom of the column. Remove the mouthpiece from your mouth and breathe out normally. Rest for a few seconds and repeat Steps 1 through 7 at least 10 times every 1-2 hours when you are awake. Take your time and take a few normal breaths between deep breaths. The spirometer may include an indicator to show your best effort. Use the indicator as  a goal to work toward during each repetition. After each set of 10 deep breaths, practice coughing to be sure your lungs are clear. If you have an incision (the cut made at the time of surgery), support your incision when coughing by placing a pillow or rolled up towels firmly against it. Once you are able to get out of bed, walk around indoors and cough well. You may stop using the incentive spirometer when instructed by your caregiver.  RISKS AND COMPLICATIONS Take your time so you do not get dizzy or light-headed. If you are in pain, you may need to take or ask for pain medication before doing incentive spirometry. It is harder to take a deep breath if you are having pain. AFTER USE Rest and breathe slowly and easily. It can be helpful to keep track of a log of your progress. Your caregiver can provide you with a simple table to help with this. If you are using the  spirometer at home, follow these instructions: SEEK MEDICAL CARE IF:  You are having difficultly using the spirometer. You have trouble using the spirometer as often as instructed. Your pain medication is not giving enough relief while using the spirometer. You develop fever of 100.5 F (38.1 C) or higher. SEEK IMMEDIATE MEDICAL CARE IF:  You cough up bloody sputum that had not been present before. You develop fever of 102 F (38.9 C) or greater. You develop worsening pain at or near the incision site. MAKE SURE YOU:  Understand these instructions. Will watch your condition. Will get help right away if you are not doing well or get worse. Document Released: 06/27/2006 Document Revised: 05/09/2011 Document Reviewed: 08/28/2006 [facility] Patient Information 2014 [place], [place].   ________________________________________________________________________

## 2022-12-05 NOTE — Progress Notes (Signed)
Pt. Needs orders for surgery> Thanks.

## 2022-12-07 ENCOUNTER — Encounter (HOSPITAL_COMMUNITY): Payer: Self-pay

## 2022-12-07 ENCOUNTER — Other Ambulatory Visit: Payer: Self-pay

## 2022-12-07 ENCOUNTER — Encounter (HOSPITAL_COMMUNITY)
Admission: RE | Admit: 2022-12-07 | Discharge: 2022-12-07 | Disposition: A | Discharge status: Home / Self Care | Payer: BC Managed Care – PPO | Source: Ambulatory Visit | Attending: Orthopaedic Surgery | Admitting: Orthopaedic Surgery

## 2022-12-07 VITALS — BP 141/80 | HR 71 | Temp 98.0°F | Ht 66.5 in | Wt 248.0 lb

## 2022-12-07 DIAGNOSIS — M1711 Unilateral primary osteoarthritis, right knee: Secondary | ICD-10-CM | POA: Insufficient documentation

## 2022-12-07 DIAGNOSIS — Z01818 Encounter for other preprocedural examination: Secondary | ICD-10-CM | POA: Insufficient documentation

## 2022-12-07 DIAGNOSIS — E1151 Type 2 diabetes mellitus with diabetic peripheral angiopathy without gangrene: Secondary | ICD-10-CM | POA: Insufficient documentation

## 2022-12-07 DIAGNOSIS — I10 Essential (primary) hypertension: Secondary | ICD-10-CM | POA: Insufficient documentation

## 2022-12-07 HISTORY — DX: Unspecified osteoarthritis, unspecified site: M19.90

## 2022-12-07 LAB — COMPREHENSIVE METABOLIC PANEL
ALT: 16 U/L (ref 0–44)
AST: 14 U/L — ABNORMAL LOW (ref 15–41)
Albumin: 4 g/dL (ref 3.5–5.0)
Alkaline Phosphatase: 70 U/L (ref 38–126)
Anion gap: 11 (ref 5–15)
BUN: 11 mg/dL (ref 8–23)
CO2: 26 mmol/L (ref 22–32)
Calcium: 9.1 mg/dL (ref 8.9–10.3)
Chloride: 99 mmol/L (ref 98–111)
Creatinine, Ser: 0.67 mg/dL (ref 0.61–1.24)
GFR, Estimated: >60 mL/min (ref >60)
Glucose, Bld: 130 mg/dL — ABNORMAL HIGH (ref 70–99)
Potassium: 3.6 mmol/L (ref 3.5–5.1)
Sodium: 136 mmol/L (ref 135–145)
Total Bilirubin: 1.1 mg/dL (ref 0.3–1.2)
Total Protein: 7.2 g/dL (ref 6.5–8.1)

## 2022-12-07 LAB — CBC
HCT: 40.5 % (ref 39.0–52.0)
Hemoglobin: 13.6 g/dL (ref 13.0–17.0)
MCH: 28.9 pg (ref 26.0–34.0)
MCHC: 33.6 g/dL (ref 30.0–36.0)
MCV: 86 fL (ref 80.0–100.0)
Platelets: 180 10*3/uL (ref 150–400)
RBC: 4.71 MIL/uL (ref 4.22–5.81)
RDW: 13.2 % (ref 11.5–15.5)
WBC: 8.4 10*3/uL (ref 4.0–10.5)
nRBC: 0 % (ref 0.0–0.2)

## 2022-12-07 LAB — HEMOGLOBIN A1C
Hgb A1c MFr Bld: 5.4 % (ref 4.8–5.6)
Mean Plasma Glucose: 108.28 mg/dL

## 2022-12-07 LAB — GLUCOSE, CAPILLARY: Glucose-Capillary: 139 mg/dL — ABNORMAL HIGH (ref 70–99)

## 2022-12-07 LAB — SURGICAL PCR SCREEN
MRSA, PCR: NEGATIVE
Staphylococcus aureus: NEGATIVE

## 2022-12-07 NOTE — Progress Notes (Signed)
   12/07/22 1139  OBSTRUCTIVE SLEEP APNEA  Have you ever been diagnosed with sleep apnea through a sleep study? No  Do you snore loudly (loud enough to be heard through closed doors)?  1  Do you often feel tired, fatigued, or sleepy during the daytime (such as falling asleep during driving or talking to someone)? 0  Has anyone observed you stop breathing during your sleep? 1  Do you have, or are you being treated for high blood pressure? 1  BMI more than 35 kg/m2? 1  Age > 50 (1-yes) 1  Neck circumference greater than:Male 16 inches or larger, Male 17inches or larger? 0  Male Gender (Yes=1) 1  Obstructive Sleep Apnea Score 6

## 2022-12-07 NOTE — Progress Notes (Signed)
For Short Stay: COVID SWAB appointment date:  Bowel Prep reminder:   For Anesthesia: PCP - Shireen Quan, DO  Cardiologist - N/A  Chest x-ray -  EKG - 12/07/22 Stress Test -  ECHO - 04/13/19 Cardiac Cath -  Pacemaker/ICD device last checked: Pacemaker orders received: Device Rep notified:  Spinal Cord Stimulator:  Sleep Study - N/A CPAP -   Fasting Blood Sugar - N/A Checks Blood Sugar ___0__ times a day Date and result of last Hgb A1c-  Last dose of GLP1 agonist- N/A GLP1 instructions:   Last dose of SGLT-2 inhibitors- N/A SGLT-2 instructions:   Blood Thinner Instructions: Aspirin Instructions: Last Dose:  Activity level: Can go up a flight of stairs and activities of daily living without stopping and without chest pain and/or shortness of breath   Able to exercise without chest pain and/or shortness of breath  Anesthesia review: Hx: HTN,Stroke,DIA,ETOH abuse.  Patient denies shortness of breath, fever, cough and chest pain at PAT appointment   Patient verbalized understanding of instructions that were given to them at the PAT appointment. Patient was also instructed that they will need to review over the PAT instructions again at home before surgery.

## 2022-12-12 ENCOUNTER — Encounter (HOSPITAL_COMMUNITY): Payer: Self-pay | Admitting: Physician Assistant

## 2022-12-14 ENCOUNTER — Telehealth: Payer: Self-pay | Admitting: *Deleted

## 2022-12-14 DIAGNOSIS — Z6841 Body Mass Index (BMI) 40.0 and over, adult: Secondary | ICD-10-CM | POA: Diagnosis not present

## 2022-12-14 DIAGNOSIS — J019 Acute sinusitis, unspecified: Secondary | ICD-10-CM | POA: Diagnosis not present

## 2022-12-14 NOTE — Telephone Encounter (Signed)
I called Benjamin Rosales and rescheduled surgery to 01-06-23.

## 2022-12-14 NOTE — Telephone Encounter (Signed)
LMOM for the patient of the below message and told him to let us know what he decides

## 2022-12-16 DIAGNOSIS — Z01818 Encounter for other preprocedural examination: Secondary | ICD-10-CM

## 2022-12-16 DIAGNOSIS — I10 Essential (primary) hypertension: Secondary | ICD-10-CM

## 2022-12-16 DIAGNOSIS — E1151 Type 2 diabetes mellitus with diabetic peripheral angiopathy without gangrene: Secondary | ICD-10-CM

## 2022-12-16 DIAGNOSIS — M1711 Unilateral primary osteoarthritis, right knee: Secondary | ICD-10-CM

## 2022-12-29 ENCOUNTER — Encounter: Payer: BC Managed Care – PPO | Admitting: Orthopaedic Surgery

## 2023-01-01 ENCOUNTER — Emergency Department (HOSPITAL_BASED_OUTPATIENT_CLINIC_OR_DEPARTMENT_OTHER): Payer: BC Managed Care – PPO

## 2023-01-01 ENCOUNTER — Other Ambulatory Visit: Payer: Self-pay

## 2023-01-01 ENCOUNTER — Encounter (HOSPITAL_BASED_OUTPATIENT_CLINIC_OR_DEPARTMENT_OTHER): Payer: Self-pay | Admitting: *Deleted

## 2023-01-01 ENCOUNTER — Emergency Department (HOSPITAL_BASED_OUTPATIENT_CLINIC_OR_DEPARTMENT_OTHER)
Admission: EM | Admit: 2023-01-01 | Discharge: 2023-01-01 | Disposition: A | Discharge status: Home / Self Care | Payer: BC Managed Care – PPO | Attending: Emergency Medicine | Admitting: Emergency Medicine

## 2023-01-01 DIAGNOSIS — Z7984 Long term (current) use of oral hypoglycemic drugs: Secondary | ICD-10-CM | POA: Diagnosis not present

## 2023-01-01 DIAGNOSIS — Z8673 Personal history of transient ischemic attack (TIA), and cerebral infarction without residual deficits: Secondary | ICD-10-CM | POA: Diagnosis not present

## 2023-01-01 DIAGNOSIS — E119 Type 2 diabetes mellitus without complications: Secondary | ICD-10-CM | POA: Insufficient documentation

## 2023-01-01 DIAGNOSIS — M7121 Synovial cyst of popliteal space [Baker], right knee: Secondary | ICD-10-CM | POA: Diagnosis not present

## 2023-01-01 DIAGNOSIS — I10 Essential (primary) hypertension: Secondary | ICD-10-CM | POA: Insufficient documentation

## 2023-01-01 DIAGNOSIS — M79662 Pain in left lower leg: Secondary | ICD-10-CM | POA: Diagnosis not present

## 2023-01-01 DIAGNOSIS — R600 Localized edema: Secondary | ICD-10-CM | POA: Diagnosis not present

## 2023-01-01 DIAGNOSIS — Z79899 Other long term (current) drug therapy: Secondary | ICD-10-CM | POA: Insufficient documentation

## 2023-01-01 DIAGNOSIS — Z7982 Long term (current) use of aspirin: Secondary | ICD-10-CM | POA: Insufficient documentation

## 2023-01-01 DIAGNOSIS — M79604 Pain in right leg: Secondary | ICD-10-CM | POA: Diagnosis not present

## 2023-01-01 LAB — CBC WITH DIFFERENTIAL/PLATELET
Abs Immature Granulocytes: 0.03 10*3/uL (ref 0.00–0.07)
Basophils Absolute: 0 10*3/uL (ref 0.0–0.1)
Basophils Relative: 1 %
Eosinophils Absolute: 0.1 10*3/uL (ref 0.0–0.5)
Eosinophils Relative: 2 %
HCT: 37.9 % — ABNORMAL LOW (ref 39.0–52.0)
Hemoglobin: 12.8 g/dL — ABNORMAL LOW (ref 13.0–17.0)
Immature Granulocytes: 1 %
Lymphocytes Relative: 21 %
Lymphs Abs: 1.1 10*3/uL (ref 0.7–4.0)
MCH: 28.4 pg (ref 26.0–34.0)
MCHC: 33.8 g/dL (ref 30.0–36.0)
MCV: 84 fL (ref 80.0–100.0)
Monocytes Absolute: 0.7 10*3/uL (ref 0.1–1.0)
Monocytes Relative: 14 %
Neutro Abs: 3.1 10*3/uL (ref 1.7–7.7)
Neutrophils Relative %: 61 %
Platelets: 201 10*3/uL (ref 150–400)
RBC: 4.51 MIL/uL (ref 4.22–5.81)
RDW: 13.5 % (ref 11.5–15.5)
WBC: 5.1 10*3/uL (ref 4.0–10.5)
nRBC: 0 % (ref 0.0–0.2)

## 2023-01-01 LAB — COMPREHENSIVE METABOLIC PANEL
ALT: 14 U/L (ref 0–44)
AST: 14 U/L — ABNORMAL LOW (ref 15–41)
Albumin: 4.3 g/dL (ref 3.5–5.0)
Alkaline Phosphatase: 84 U/L (ref 38–126)
Anion gap: 7 (ref 5–15)
BUN: 12 mg/dL (ref 8–23)
CO2: 28 mmol/L (ref 22–32)
Calcium: 9.4 mg/dL (ref 8.9–10.3)
Chloride: 103 mmol/L (ref 98–111)
Creatinine, Ser: 0.72 mg/dL (ref 0.61–1.24)
GFR, Estimated: >60 mL/min (ref >60)
Glucose, Bld: 119 mg/dL — ABNORMAL HIGH (ref 70–99)
Potassium: 4.1 mmol/L (ref 3.5–5.1)
Sodium: 138 mmol/L (ref 135–145)
Total Bilirubin: 0.8 mg/dL (ref 0.3–1.2)
Total Protein: 6.8 g/dL (ref 6.5–8.1)

## 2023-01-01 LAB — LACTIC ACID, PLASMA: Lactic Acid, Venous: 0.8 mmol/L (ref 0.5–1.9)

## 2023-01-01 MED ORDER — AMOXICILLIN-POT CLAVULANATE 875-125 MG PO TABS
1.0000 | ORAL_TABLET | Freq: Two times a day (BID) | ORAL | 0 refills | Status: AC
Start: 2023-01-01 — End: 2023-01-08

## 2023-01-01 NOTE — ED Triage Notes (Signed)
Pt is here for right calf pain and redness and swelling x 1 week.  Pt denies any injury.  Area feels firm.  No drainage

## 2023-01-01 NOTE — ED Notes (Signed)
PA to see pt in triage for MSE

## 2023-01-01 NOTE — ED Provider Notes (Signed)
[place] EMERGENCY DEPARTMENT AT [facility] Provider Note   CSN: [id number] Arrival date & time: 01/01/23  1047     History  Chief Complaint  Patient presents with   Leg Pain    Benjamin Rosales. is a 67 y.o. male with history of type 2 diabetes, hypertension, lacunar stroke, who presents with concern for right calf pain that has been ongoing for the past week.  He notices right calf has been more swollen compared to his left.  Also notes an area of redness along the medial side of his calf.  Denies any fever or chills.  Denies any injuries to the area or open wounds.   Leg Pain Associated symptoms: no fever        Home Medications Prior to Admission medications   Medication Sig Start Date End Date Taking? Authorizing Provider  amoxicillin-clavulanate (AUGMENTIN) 875-125 MG tablet Take 1 tablet by mouth every 12 (twelve) hours for 7 days. 01/01/23 01/08/23 Yes Arabella Merles, PA-C  amLODipine (NORVASC) 10 MG tablet Take 1 tablet (10 mg total) by mouth daily. FOLLOW UP NEEDED. 09/27/22   [place], Betty G, MD  aspirin 81 MG chewable tablet Chew 1 tablet (81 mg total) by mouth daily. 04/15/19   Aquilla Hacker, MD  atorvastatin (LIPITOR) 40 MG tablet TAKE 1 TABLET(40 MG) BY MOUTH DAILY 04/04/22   [place], Betty G, MD  Cyanocobalamin (VITAMIN B12) 1000 MCG TBCR Take 1,000 mcg by mouth daily. 10/15/19   [place], Betty G, MD  glucose blood [facility] VERIO) test strip Use to test 1-2 times daily. 05/14/19   [place], Betty G, MD  hydrALAZINE (APRESOLINE) 25 MG tablet TAKE 1 TABLET(25 MG) BY MOUTH THREE TIMES DAILY 11/01/22   [place], Betty G, MD  hydrochlorothiazide (HYDRODIURIL) 25 MG tablet TAKE 1 TABLET(25 MG) BY MOUTH DAILY 09/27/22   [place], Betty G, MD  metFORMIN (GLUCOPHAGE) 500 MG tablet TAKE 1 TABLET(500 MG) BY MOUTH TWICE DAILY WITH A MEAL 08/31/22   [place], Betty G, MD  Multiple Vitamin (MULTIVITAMIN) tablet Take 1 tablet by mouth daily.    [provider]   naproxen sodium (ALEVE) 220 MG tablet Take 440 mg by mouth as needed (pain).    [provider]  Pseudoephedrine-Ibuprofen (ADVIL COLD/SINUS PO) Take 1 tablet by mouth as needed (cold/sinus).    [provider]      Allergies    Patient has no known allergies.    Review of Systems   Review of Systems  Constitutional:  Negative for chills and fever.  Skin:  Positive for color change. Negative for wound.    Physical Exam Updated Vital Signs BP 124/69   Pulse 62   Temp 98 F (36.7 C)   Resp 18   SpO2 100%  Physical Exam Vitals and nursing note reviewed.  Constitutional:      Appearance: Normal appearance.  HENT:     Head: Atraumatic.  Cardiovascular:     Rate and Rhythm: Normal rate and regular rhythm.     Comments: Dorsalis pedis pulse 2+ bilaterally Pulmonary:     Effort: Pulmonary effort is normal.  Musculoskeletal:        General: Normal range of motion.     Comments: Left calf more edematous compared to the right.  There is an area of dried erythemous skin approximately 2cm in size on the medial side of the calf.  Left calf tender to palpation of the medial side.  Dry skin over the anterior of the  right lower extremity    Neurological:     General: No focal deficit present.     Mental Status: He is alert.  Psychiatric:        Mood and Affect: Mood normal.        Behavior: Behavior normal.     ED Results / Procedures / Treatments   Labs (all labs ordered are listed, but only abnormal results are displayed) Labs Reviewed  COMPREHENSIVE METABOLIC PANEL - Abnormal; Notable for the following components:      Result Value   Glucose, Bld 119 (*)    AST 14 (*)    All other components within normal limits  CBC WITH DIFFERENTIAL/PLATELET - Abnormal; Notable for the following components:   Hemoglobin 12.8 (*)    HCT 37.9 (*)    All other components within normal limits  LACTIC ACID, PLASMA    EKG None  Radiology US Venous Img Lower Right  (DVT Study)  Result Date: 01/01/2023 CLINICAL DATA:  Right lower extremity pain and edema. History of the Baker's cyst. Evaluate for DVT. EXAM: RIGHT LOWER EXTREMITY VENOUS DOPPLER ULTRASOUND TECHNIQUE: Gray-scale sonography with graded compression, as well as color Doppler and duplex ultrasound were performed to evaluate the lower extremity deep venous systems from the level of the common femoral vein and including the common femoral, femoral, profunda femoral, popliteal and calf veins including the posterior tibial, peroneal and gastrocnemius veins when visible. The superficial great saphenous vein was also interrogated. Spectral Doppler was utilized to evaluate flow at rest and with distal augmentation maneuvers in the common femoral, femoral and popliteal veins. COMPARISON:  None Available. FINDINGS: Contralateral Common Femoral Vein: Respiratory phasicity is normal and symmetric with the symptomatic side. No evidence of thrombus. Normal compressibility. Common Femoral Vein: No evidence of thrombus. Normal compressibility, respiratory phasicity and response to augmentation. Saphenofemoral Junction: No evidence of thrombus. Normal compressibility and flow on color Doppler imaging. Profunda Femoral Vein: No evidence of thrombus. Normal compressibility and flow on color Doppler imaging. Femoral Vein: No evidence of thrombus. Normal compressibility, respiratory phasicity and response to augmentation. Popliteal Vein: No evidence of thrombus. Normal compressibility, respiratory phasicity and response to augmentation. Calf Veins: No evidence of thrombus. Normal compressibility and flow on color Doppler imaging. Superficial Great Saphenous Vein: No evidence of thrombus. Normal compressibility. Other Findings: There is a 6.6 x 3.1 x 1.6 cm complex fluid collection with the left popliteal fossa compatible with a Baker's cyst. Additionally, there is a mixed echogenic collection within the right calf measuring at least  12.5 x 8.2 x 1.4 cm. IMPRESSION: 1. No evidence of DVT within right lower extremity. 2. Suspected leaking right-sided Baker's cyst with component at the level of the popliteal fossa measuring 6.6 cm with extension to involve the right calf measuring 12.5 cm. Electronically Signed   By: Simonne Come M.D.   On: 01/01/2023 13:32    Procedures Procedures    Medications Ordered in ED Medications - No data to display  ED Course/ Medical Decision Making/ A&P                                 Medical Decision Making Amount and/or Complexity of Data Reviewed Labs: ordered.  Risk Prescription drug management.    Differential diagnosis includes but is not limited to DVT, PE, cellulitis, Baker's cyst, venous insufficiency  ED Course:  Patient overall well-appearing, no acute distress.  Vital signs completely stable.  Right calf is more edematous compared to the left, and slightly tender to palpation.  He has a slightly erythematous area of skin over the medial aspect of his calf, does not appear acutely cellulitic, more concern for venous insufficiency.  He has 2+ dorsalis pedis pulses.  Full range of motion of the knees and ankles bilaterally.  No DVT noted on ultrasound, but significant Baker's cyst was noted.  Feel his swelling is likely secondary to ruptured Baker's cyst.  Appropriate for discharge home at this time.  He was prescribed Augmentin.  I instructed patient to start taking this antibiotic for possible cellulitis if the redness in his calf has not tried to improve within the next 5 days, or starts to get worse.   Impression: Ruptured Baker's cyst, right knee  Disposition:  The patient was discharged home with instructions to allow the fluid from the Baker's cyst to resorb.  Compression socks to help with swelling.  Augmentin if the redness over his calf does not start to improve over the next 5 days, or starts to worsen. Return precautions given.  Lab Tests: I Ordered, and  personally interpreted labs.  The pertinent results include:   CBC without leukocytosis CMP unremarkable Lactic acid within normal limits  Imaging Studies ordered: I ordered imaging studies including DVT ultrasound right lower extremity I independently visualized the imaging with scope of interpretation limited to determining acute life threatening conditions related to emergency care. Imaging showed no DVT I agree with the radiologist interpretation            Final Clinical Impression(s) / ED Diagnoses Final diagnoses:  Baker's cyst of knee, right    Rx / DC Orders ED Discharge Orders          Ordered    amoxicillin-clavulanate (AUGMENTIN) 875-125 MG tablet  Every 12 hours        01/01/23 1422              Arabella Merles, PA-C 01/01/23 1540    Ernie Avena, MD 01/02/23 1553

## 2023-01-01 NOTE — ED Provider Triage Note (Signed)
Emergency Medicine Provider Triage Evaluation Note  Benjamin Lysaght. , a 67 y.o. male  was evaluated in triage.  Pt complains of left leg swelling and an area of redness on the medial side of the left leg ongoing for the past couple days.  He denies any injuries to the area.  Denies any chest pain, shortness of breath.  Denies any fever.  Review of Systems  Positive: As above Negative: As above  Physical Exam  BP (!) 145/76 (BP Location: Right Arm)   Pulse 64   Temp 98 F (36.7 C)   Resp 18   SpO2 96%  Gen:   Awake, no distress   Resp:  Normal effort  MSK:   Moves extremities without difficulty.  Edematous left calf with erythematous area of skin on the medial side of the calf   Medical Decision Making  Medically screening exam initiated at 11:59 AM.  Appropriate orders placed.  Benjamin Leisa Lenz. was informed that the remainder of the evaluation will be completed by another provider, this initial triage assessment does not replace that evaluation, and the importance of remaining in the ED until their evaluation is complete.     Benjamin Merles, PA-C 01/01/23 1200

## 2023-01-01 NOTE — Discharge Instructions (Signed)
Your ultrasound today did not show any signs of a blood clot.  It did show a Baker's cyst which is a collection of fluid behind your knee.  This will reabsorb with time.  You have been prescribed Augmentin. If your leg redness does not improve within the next 5 days, or if your sinus pressure does not  improved by 9 days, you may begin taking this antibiotic. Take this antibiotic 2 times a day for the next 7 days. Take the full course of your antibiotic even if you start feeling better. Antibiotics may cause you to have diarrhea.  You may use compression socks to help with your swelling.  Return to the ER if you have severe worsening of your swelling, numbness or tingling in your foot, you have chest pain, shortness of breath, any other new or concerning symptoms.

## 2023-01-06 ENCOUNTER — Encounter (HOSPITAL_COMMUNITY): Admission: RE | Payer: Self-pay | Source: Ambulatory Visit

## 2023-01-06 ENCOUNTER — Ambulatory Visit (HOSPITAL_COMMUNITY)
Admission: RE | Admit: 2023-01-06 | Payer: BC Managed Care – PPO | Source: Ambulatory Visit | Admitting: Orthopaedic Surgery

## 2023-01-06 DIAGNOSIS — M1711 Unilateral primary osteoarthritis, right knee: Secondary | ICD-10-CM

## 2023-01-06 DIAGNOSIS — E1151 Type 2 diabetes mellitus with diabetic peripheral angiopathy without gangrene: Secondary | ICD-10-CM

## 2023-01-06 DIAGNOSIS — I10 Essential (primary) hypertension: Secondary | ICD-10-CM

## 2023-01-06 DIAGNOSIS — Z01818 Encounter for other preprocedural examination: Secondary | ICD-10-CM

## 2023-01-06 SURGERY — ARTHROPLASTY, KNEE, TOTAL
Anesthesia: Spinal | Site: Knee | Laterality: Right

## 2023-01-16 DIAGNOSIS — E559 Vitamin D deficiency, unspecified: Secondary | ICD-10-CM | POA: Diagnosis not present

## 2023-01-16 DIAGNOSIS — E1165 Type 2 diabetes mellitus with hyperglycemia: Secondary | ICD-10-CM | POA: Diagnosis not present

## 2023-01-16 DIAGNOSIS — Z0000 Encounter for general adult medical examination without abnormal findings: Secondary | ICD-10-CM | POA: Diagnosis not present

## 2023-01-16 DIAGNOSIS — E782 Mixed hyperlipidemia: Secondary | ICD-10-CM | POA: Diagnosis not present

## 2023-01-16 DIAGNOSIS — Z23 Encounter for immunization: Secondary | ICD-10-CM | POA: Diagnosis not present

## 2023-01-16 DIAGNOSIS — I10 Essential (primary) hypertension: Secondary | ICD-10-CM | POA: Diagnosis not present

## 2023-01-16 DIAGNOSIS — Z125 Encounter for screening for malignant neoplasm of prostate: Secondary | ICD-10-CM | POA: Diagnosis not present

## 2023-01-19 ENCOUNTER — Encounter: Payer: BC Managed Care – PPO | Admitting: Orthopaedic Surgery

## 2023-01-23 ENCOUNTER — Ambulatory Visit
Admission: RE | Admit: 2023-01-23 | Discharge: 2023-01-23 | Disposition: A | Discharge status: Home / Self Care | Payer: BC Managed Care – PPO | Source: Ambulatory Visit | Attending: Family Medicine | Admitting: Family Medicine

## 2023-01-23 ENCOUNTER — Other Ambulatory Visit: Payer: Self-pay | Admitting: Family Medicine

## 2023-01-23 DIAGNOSIS — R52 Pain, unspecified: Secondary | ICD-10-CM

## 2023-01-23 DIAGNOSIS — M25561 Pain in right knee: Secondary | ICD-10-CM | POA: Diagnosis not present

## 2023-07-26 ENCOUNTER — Telehealth: Payer: Self-pay | Admitting: Orthopaedic Surgery

## 2023-07-26 NOTE — Telephone Encounter (Signed)
 Please copy 08/02/21 knee xray to CD. Please let me know when ready. Thank you!

## 2024-01-01 ENCOUNTER — Encounter: Payer: Self-pay | Admitting: Radiology
